# Patient Record
Sex: Female | Born: 1960 | Race: Black or African American | Hispanic: No | Marital: Single | Smoking: Former smoker
Health system: Southern US, Community
[De-identification: ages and names within clinical notes are randomized; demographics above are authoritative.]

## PROBLEM LIST (undated history)

## (undated) DIAGNOSIS — D219 Benign neoplasm of connective and other soft tissue, unspecified: Secondary | ICD-10-CM

## (undated) DIAGNOSIS — M1990 Unspecified osteoarthritis, unspecified site: Secondary | ICD-10-CM

## (undated) DIAGNOSIS — M25559 Pain in unspecified hip: Secondary | ICD-10-CM

## (undated) DIAGNOSIS — I10 Essential (primary) hypertension: Secondary | ICD-10-CM

## (undated) DIAGNOSIS — K635 Polyp of colon: Secondary | ICD-10-CM

## (undated) DIAGNOSIS — D649 Anemia, unspecified: Secondary | ICD-10-CM

## (undated) DIAGNOSIS — N946 Dysmenorrhea, unspecified: Secondary | ICD-10-CM

## (undated) DIAGNOSIS — R079 Chest pain, unspecified: Secondary | ICD-10-CM

## (undated) DIAGNOSIS — F17200 Nicotine dependence, unspecified, uncomplicated: Secondary | ICD-10-CM

## (undated) DIAGNOSIS — I499 Cardiac arrhythmia, unspecified: Secondary | ICD-10-CM

## (undated) HISTORY — DX: Essential (primary) hypertension: I10

## (undated) HISTORY — PX: UMBILICAL HERNIA REPAIR: SHX196

## (undated) HISTORY — DX: Dysmenorrhea, unspecified: N94.6

## (undated) HISTORY — DX: Benign neoplasm of connective and other soft tissue, unspecified: D21.9

## (undated) HISTORY — DX: Polyp of colon: K63.5

## (undated) HISTORY — DX: Nicotine dependence, unspecified, uncomplicated: F17.200

## (undated) HISTORY — DX: Anemia, unspecified: D64.9

## (undated) HISTORY — DX: Pain in unspecified hip: M25.559

---

## 1991-01-23 HISTORY — PX: GYNECOLOGIC CRYOSURGERY: SHX857

## 1998-01-30 ENCOUNTER — Encounter: Payer: Self-pay | Admitting: Emergency Medicine

## 1998-01-30 ENCOUNTER — Emergency Department (HOSPITAL_COMMUNITY): Admission: EM | Admit: 1998-01-30 | Discharge: 1998-01-30 | Payer: Self-pay | Admitting: Emergency Medicine

## 1998-09-27 ENCOUNTER — Emergency Department (HOSPITAL_COMMUNITY): Admission: EM | Admit: 1998-09-27 | Discharge: 1998-09-27 | Payer: Self-pay | Admitting: *Deleted

## 1999-02-22 ENCOUNTER — Other Ambulatory Visit: Admission: RE | Admit: 1999-02-22 | Discharge: 1999-02-22 | Payer: Self-pay | Admitting: Gynecology

## 2000-03-22 ENCOUNTER — Encounter: Payer: Self-pay | Admitting: Gynecology

## 2000-03-22 ENCOUNTER — Ambulatory Visit (HOSPITAL_COMMUNITY): Admission: RE | Admit: 2000-03-22 | Discharge: 2000-03-22 | Payer: Self-pay | Admitting: Gynecology

## 2000-03-26 ENCOUNTER — Encounter: Payer: Self-pay | Admitting: Gynecology

## 2000-03-26 ENCOUNTER — Encounter: Admission: RE | Admit: 2000-03-26 | Discharge: 2000-03-26 | Payer: Self-pay | Admitting: Gynecology

## 2000-05-09 ENCOUNTER — Other Ambulatory Visit: Admission: RE | Admit: 2000-05-09 | Discharge: 2000-05-09 | Payer: Self-pay | Admitting: Gynecology

## 2000-05-29 ENCOUNTER — Other Ambulatory Visit: Admission: RE | Admit: 2000-05-29 | Discharge: 2000-05-29 | Payer: Self-pay | Admitting: Gynecology

## 2001-01-22 HISTORY — PX: CARDIAC CATHETERIZATION: SHX172

## 2001-03-31 ENCOUNTER — Encounter: Payer: Self-pay | Admitting: Gynecology

## 2001-03-31 ENCOUNTER — Ambulatory Visit (HOSPITAL_COMMUNITY): Admission: RE | Admit: 2001-03-31 | Discharge: 2001-03-31 | Payer: Self-pay | Admitting: Gynecology

## 2001-05-12 ENCOUNTER — Other Ambulatory Visit: Admission: RE | Admit: 2001-05-12 | Discharge: 2001-05-12 | Payer: Self-pay | Admitting: Gynecology

## 2001-05-15 ENCOUNTER — Encounter: Admission: RE | Admit: 2001-05-15 | Discharge: 2001-05-15 | Payer: Self-pay | Admitting: Gynecology

## 2001-05-15 ENCOUNTER — Encounter: Payer: Self-pay | Admitting: Gynecology

## 2001-05-19 ENCOUNTER — Encounter: Payer: Self-pay | Admitting: Gynecology

## 2001-05-19 ENCOUNTER — Ambulatory Visit (HOSPITAL_COMMUNITY): Admission: RE | Admit: 2001-05-19 | Discharge: 2001-05-19 | Payer: Self-pay | Admitting: Gynecology

## 2001-05-20 ENCOUNTER — Other Ambulatory Visit: Admission: RE | Admit: 2001-05-20 | Discharge: 2001-05-20 | Payer: Self-pay | Admitting: Gynecology

## 2001-06-17 ENCOUNTER — Ambulatory Visit: Admission: RE | Admit: 2001-06-17 | Discharge: 2001-06-17 | Payer: Self-pay | Admitting: Gynecologic Oncology

## 2001-06-22 HISTORY — PX: ABDOMINAL HYSTERECTOMY: SHX81

## 2001-07-02 ENCOUNTER — Inpatient Hospital Stay (HOSPITAL_COMMUNITY): Admission: RE | Admit: 2001-07-02 | Discharge: 2001-07-05 | Payer: Self-pay | Admitting: Gynecology

## 2001-07-02 ENCOUNTER — Encounter (INDEPENDENT_AMBULATORY_CARE_PROVIDER_SITE_OTHER): Payer: Self-pay | Admitting: Specialist

## 2002-04-27 ENCOUNTER — Emergency Department (HOSPITAL_COMMUNITY): Admission: EM | Admit: 2002-04-27 | Discharge: 2002-04-27 | Payer: Self-pay | Admitting: Emergency Medicine

## 2002-07-07 ENCOUNTER — Encounter: Admission: RE | Admit: 2002-07-07 | Discharge: 2002-07-07 | Payer: Self-pay | Admitting: Gynecology

## 2002-07-07 ENCOUNTER — Encounter: Payer: Self-pay | Admitting: Gynecology

## 2002-08-27 ENCOUNTER — Other Ambulatory Visit: Admission: RE | Admit: 2002-08-27 | Discharge: 2002-08-27 | Payer: Self-pay | Admitting: Gynecology

## 2003-06-07 HISTORY — PX: OTHER SURGICAL HISTORY: SHX169

## 2003-07-27 ENCOUNTER — Emergency Department (HOSPITAL_COMMUNITY): Admission: EM | Admit: 2003-07-27 | Discharge: 2003-07-27 | Payer: Self-pay | Admitting: Emergency Medicine

## 2003-09-03 ENCOUNTER — Other Ambulatory Visit: Admission: RE | Admit: 2003-09-03 | Discharge: 2003-09-03 | Payer: Self-pay | Admitting: Gynecology

## 2004-06-21 ENCOUNTER — Emergency Department (HOSPITAL_COMMUNITY): Admission: EM | Admit: 2004-06-21 | Discharge: 2004-06-21 | Payer: Self-pay | Admitting: Emergency Medicine

## 2004-07-03 HISTORY — PX: TRANSTHORACIC ECHOCARDIOGRAM: SHX275

## 2004-09-29 ENCOUNTER — Other Ambulatory Visit: Admission: RE | Admit: 2004-09-29 | Discharge: 2004-09-29 | Payer: Self-pay | Admitting: Gynecology

## 2004-12-21 ENCOUNTER — Encounter: Admission: RE | Admit: 2004-12-21 | Discharge: 2004-12-21 | Payer: Self-pay | Admitting: Gynecology

## 2005-02-16 ENCOUNTER — Encounter: Admission: RE | Admit: 2005-02-16 | Discharge: 2005-02-16 | Payer: Self-pay | Admitting: Gynecology

## 2005-04-01 ENCOUNTER — Emergency Department (HOSPITAL_COMMUNITY): Admission: EM | Admit: 2005-04-01 | Discharge: 2005-04-01 | Payer: Self-pay | Admitting: Emergency Medicine

## 2005-06-12 ENCOUNTER — Emergency Department (HOSPITAL_COMMUNITY): Admission: EM | Admit: 2005-06-12 | Discharge: 2005-06-13 | Payer: Self-pay | Admitting: Emergency Medicine

## 2005-06-13 ENCOUNTER — Ambulatory Visit (HOSPITAL_COMMUNITY): Admission: RE | Admit: 2005-06-13 | Discharge: 2005-06-13 | Payer: Self-pay | Admitting: Emergency Medicine

## 2005-06-13 ENCOUNTER — Encounter (INDEPENDENT_AMBULATORY_CARE_PROVIDER_SITE_OTHER): Payer: Self-pay | Admitting: *Deleted

## 2005-06-23 ENCOUNTER — Emergency Department (HOSPITAL_COMMUNITY): Admission: EM | Admit: 2005-06-23 | Discharge: 2005-06-24 | Payer: Self-pay | Admitting: Emergency Medicine

## 2005-07-11 ENCOUNTER — Ambulatory Visit (HOSPITAL_COMMUNITY): Admission: RE | Admit: 2005-07-11 | Discharge: 2005-07-11 | Payer: Self-pay | Admitting: Gastroenterology

## 2005-08-02 ENCOUNTER — Emergency Department (HOSPITAL_COMMUNITY): Admission: EM | Admit: 2005-08-02 | Discharge: 2005-08-03 | Payer: Self-pay | Admitting: *Deleted

## 2005-09-24 ENCOUNTER — Emergency Department (HOSPITAL_COMMUNITY): Admission: EM | Admit: 2005-09-24 | Discharge: 2005-09-24 | Payer: Self-pay | Admitting: Emergency Medicine

## 2005-10-10 ENCOUNTER — Encounter: Admission: RE | Admit: 2005-10-10 | Discharge: 2005-10-10 | Payer: Self-pay | Admitting: Gynecology

## 2005-10-18 ENCOUNTER — Other Ambulatory Visit: Admission: RE | Admit: 2005-10-18 | Discharge: 2005-10-18 | Payer: Self-pay | Admitting: Gynecology

## 2005-11-26 ENCOUNTER — Emergency Department (HOSPITAL_COMMUNITY): Admission: EM | Admit: 2005-11-26 | Discharge: 2005-11-26 | Payer: Self-pay | Admitting: Family Medicine

## 2005-12-24 ENCOUNTER — Encounter: Admission: RE | Admit: 2005-12-24 | Discharge: 2005-12-24 | Payer: Self-pay | Admitting: Gynecology

## 2006-01-08 ENCOUNTER — Encounter: Admission: RE | Admit: 2006-01-08 | Discharge: 2006-01-08 | Payer: Self-pay | Admitting: Gynecology

## 2006-09-03 ENCOUNTER — Emergency Department (HOSPITAL_COMMUNITY): Admission: EM | Admit: 2006-09-03 | Discharge: 2006-09-03 | Payer: Self-pay | Admitting: Emergency Medicine

## 2007-01-24 ENCOUNTER — Encounter: Admission: RE | Admit: 2007-01-24 | Discharge: 2007-01-24 | Payer: Self-pay | Admitting: Gynecology

## 2007-01-31 ENCOUNTER — Encounter: Admission: RE | Admit: 2007-01-31 | Discharge: 2007-01-31 | Payer: Self-pay | Admitting: Gynecology

## 2007-03-19 ENCOUNTER — Emergency Department (HOSPITAL_COMMUNITY): Admission: EM | Admit: 2007-03-19 | Discharge: 2007-03-19 | Payer: Self-pay | Admitting: Family Medicine

## 2007-03-27 ENCOUNTER — Emergency Department (HOSPITAL_COMMUNITY): Admission: EM | Admit: 2007-03-27 | Discharge: 2007-03-27 | Payer: Self-pay | Admitting: Emergency Medicine

## 2007-03-31 ENCOUNTER — Ambulatory Visit: Payer: Self-pay | Admitting: Internal Medicine

## 2007-03-31 ENCOUNTER — Encounter: Admission: RE | Admit: 2007-03-31 | Discharge: 2007-03-31 | Payer: Self-pay | Admitting: Internal Medicine

## 2007-03-31 ENCOUNTER — Telehealth: Payer: Self-pay | Admitting: Internal Medicine

## 2007-03-31 DIAGNOSIS — K219 Gastro-esophageal reflux disease without esophagitis: Secondary | ICD-10-CM | POA: Insufficient documentation

## 2007-04-07 ENCOUNTER — Emergency Department (HOSPITAL_COMMUNITY): Admission: EM | Admit: 2007-04-07 | Discharge: 2007-04-07 | Payer: Self-pay | Admitting: Emergency Medicine

## 2007-05-10 ENCOUNTER — Emergency Department (HOSPITAL_COMMUNITY): Admission: EM | Admit: 2007-05-10 | Discharge: 2007-05-10 | Payer: Self-pay | Admitting: Emergency Medicine

## 2007-06-12 ENCOUNTER — Encounter: Admission: RE | Admit: 2007-06-12 | Discharge: 2007-06-12 | Payer: Self-pay | Admitting: Internal Medicine

## 2007-06-22 ENCOUNTER — Emergency Department (HOSPITAL_COMMUNITY): Admission: EM | Admit: 2007-06-22 | Discharge: 2007-06-22 | Payer: Self-pay | Admitting: Family Medicine

## 2007-07-04 ENCOUNTER — Emergency Department (HOSPITAL_COMMUNITY): Admission: EM | Admit: 2007-07-04 | Discharge: 2007-07-04 | Payer: Self-pay | Admitting: Emergency Medicine

## 2007-08-17 ENCOUNTER — Emergency Department (HOSPITAL_COMMUNITY): Admission: EM | Admit: 2007-08-17 | Discharge: 2007-08-18 | Payer: Self-pay | Admitting: Emergency Medicine

## 2007-10-08 ENCOUNTER — Ambulatory Visit: Payer: Self-pay | Admitting: Obstetrics & Gynecology

## 2007-10-18 ENCOUNTER — Emergency Department (HOSPITAL_COMMUNITY): Admission: EM | Admit: 2007-10-18 | Discharge: 2007-10-18 | Payer: Self-pay | Admitting: *Deleted

## 2007-11-13 ENCOUNTER — Encounter: Payer: Self-pay | Admitting: Family Medicine

## 2007-11-13 ENCOUNTER — Ambulatory Visit: Payer: Self-pay | Admitting: Family Medicine

## 2007-11-13 DIAGNOSIS — F320 Major depressive disorder, single episode, mild: Secondary | ICD-10-CM | POA: Insufficient documentation

## 2007-11-13 LAB — CONVERTED CEMR LAB

## 2007-11-20 LAB — CONVERTED CEMR LAB
ALT: 16 units/L (ref 0–35)
AST: 15 units/L (ref 0–37)
Alkaline Phosphatase: 67 units/L (ref 39–117)
Creatinine, Ser: 0.75 mg/dL (ref 0.40–1.20)
TSH: 2.426 microintl units/mL (ref 0.350–4.50)
Total Bilirubin: 0.6 mg/dL (ref 0.3–1.2)
Total CHOL/HDL Ratio: 2.7
VLDL: 12 mg/dL (ref 0–40)

## 2007-12-16 ENCOUNTER — Ambulatory Visit: Payer: Self-pay | Admitting: Family Medicine

## 2007-12-16 ENCOUNTER — Encounter: Payer: Self-pay | Admitting: Family Medicine

## 2007-12-16 LAB — CONVERTED CEMR LAB: Pap Smear: 0

## 2008-03-03 ENCOUNTER — Encounter (INDEPENDENT_AMBULATORY_CARE_PROVIDER_SITE_OTHER): Payer: Self-pay | Admitting: Family Medicine

## 2008-03-03 ENCOUNTER — Ambulatory Visit: Payer: Self-pay | Admitting: Family Medicine

## 2008-03-03 ENCOUNTER — Telehealth: Payer: Self-pay | Admitting: Family Medicine

## 2008-03-24 ENCOUNTER — Ambulatory Visit: Payer: Self-pay | Admitting: Family Medicine

## 2008-03-24 DIAGNOSIS — E785 Hyperlipidemia, unspecified: Secondary | ICD-10-CM | POA: Insufficient documentation

## 2008-04-01 ENCOUNTER — Ambulatory Visit (HOSPITAL_COMMUNITY): Admission: RE | Admit: 2008-04-01 | Discharge: 2008-04-01 | Payer: Self-pay | Admitting: Family Medicine

## 2008-04-15 ENCOUNTER — Encounter: Admission: RE | Admit: 2008-04-15 | Discharge: 2008-04-15 | Payer: Self-pay | Admitting: Family Medicine

## 2008-04-15 ENCOUNTER — Encounter: Payer: Self-pay | Admitting: Family Medicine

## 2008-06-11 ENCOUNTER — Encounter: Payer: Self-pay | Admitting: *Deleted

## 2008-06-11 ENCOUNTER — Ambulatory Visit: Payer: Self-pay | Admitting: Family Medicine

## 2008-06-18 ENCOUNTER — Ambulatory Visit: Payer: Self-pay | Admitting: Family Medicine

## 2008-07-16 ENCOUNTER — Encounter: Payer: Self-pay | Admitting: Family Medicine

## 2008-07-16 ENCOUNTER — Ambulatory Visit: Payer: Self-pay | Admitting: Family Medicine

## 2008-07-20 ENCOUNTER — Telehealth: Payer: Self-pay | Admitting: *Deleted

## 2008-07-22 ENCOUNTER — Encounter: Payer: Self-pay | Admitting: Family Medicine

## 2008-07-22 LAB — CONVERTED CEMR LAB
CO2: 31 meq/L
GFR calc Af Amer: 99 mL/min
Glucose, Bld: 86 mg/dL
Potassium: 3.9 meq/L
Sodium: 140 meq/L
T3, Free: 4.7 pg/mL

## 2008-07-23 ENCOUNTER — Encounter: Admission: RE | Admit: 2008-07-23 | Discharge: 2008-07-23 | Payer: Self-pay | Admitting: Family Medicine

## 2008-07-23 ENCOUNTER — Telehealth: Payer: Self-pay | Admitting: Family Medicine

## 2008-07-28 HISTORY — PX: OTHER SURGICAL HISTORY: SHX169

## 2008-07-30 ENCOUNTER — Encounter: Payer: Self-pay | Admitting: Family Medicine

## 2008-08-02 ENCOUNTER — Telehealth (INDEPENDENT_AMBULATORY_CARE_PROVIDER_SITE_OTHER): Payer: Self-pay | Admitting: *Deleted

## 2008-08-30 ENCOUNTER — Encounter: Payer: Self-pay | Admitting: Family Medicine

## 2008-08-30 ENCOUNTER — Ambulatory Visit: Payer: Self-pay | Admitting: Family Medicine

## 2008-08-31 LAB — CONVERTED CEMR LAB
Free T4: 0.93 ng/dL (ref 0.80–1.80)
T3, Free: 3.3 pg/mL (ref 2.3–4.2)
TSH: 2.066 microintl units/mL (ref 0.350–4.500)

## 2008-09-02 ENCOUNTER — Ambulatory Visit: Payer: Self-pay | Admitting: Family Medicine

## 2008-09-02 ENCOUNTER — Encounter: Payer: Self-pay | Admitting: Family Medicine

## 2008-09-02 DIAGNOSIS — J309 Allergic rhinitis, unspecified: Secondary | ICD-10-CM | POA: Insufficient documentation

## 2008-09-15 LAB — CONVERTED CEMR LAB
ALT: 18 units/L (ref 0–35)
AST: 17 units/L (ref 0–37)
Alkaline Phosphatase: 72 units/L (ref 39–117)
Calcium: 9.5 mg/dL (ref 8.4–10.5)
Chloride: 108 meq/L (ref 96–112)
Creatinine, Ser: 0.83 mg/dL (ref 0.40–1.20)
Potassium: 4.1 meq/L (ref 3.5–5.3)

## 2008-11-15 ENCOUNTER — Telehealth: Payer: Self-pay | Admitting: *Deleted

## 2008-11-19 ENCOUNTER — Ambulatory Visit: Payer: Self-pay | Admitting: Family Medicine

## 2008-11-19 ENCOUNTER — Encounter: Payer: Self-pay | Admitting: Family Medicine

## 2008-11-24 ENCOUNTER — Telehealth: Payer: Self-pay | Admitting: Family Medicine

## 2008-11-25 ENCOUNTER — Ambulatory Visit: Payer: Self-pay | Admitting: Family Medicine

## 2008-12-21 ENCOUNTER — Encounter: Payer: Self-pay | Admitting: Family Medicine

## 2009-01-27 ENCOUNTER — Telehealth: Payer: Self-pay | Admitting: Family Medicine

## 2009-02-28 ENCOUNTER — Ambulatory Visit: Payer: Self-pay | Admitting: Family Medicine

## 2009-02-28 DIAGNOSIS — E663 Overweight: Secondary | ICD-10-CM | POA: Insufficient documentation

## 2009-03-01 ENCOUNTER — Encounter: Payer: Self-pay | Admitting: Family Medicine

## 2009-03-01 ENCOUNTER — Ambulatory Visit: Payer: Self-pay | Admitting: Family Medicine

## 2009-03-02 LAB — CONVERTED CEMR LAB
Direct LDL: 85 mg/dL
Hemoglobin: 12.6 g/dL (ref 12.0–15.0)
MCHC: 32.6 g/dL (ref 30.0–36.0)
MCV: 87.2 fL (ref 78.0–100.0)
RBC: 4.44 M/uL (ref 3.87–5.11)
RDW: 12.9 % (ref 11.5–15.5)

## 2009-03-07 ENCOUNTER — Telehealth: Payer: Self-pay | Admitting: Family Medicine

## 2009-03-08 ENCOUNTER — Telehealth: Payer: Self-pay | Admitting: Family Medicine

## 2009-03-28 ENCOUNTER — Telehealth: Payer: Self-pay | Admitting: Family Medicine

## 2009-03-28 ENCOUNTER — Ambulatory Visit: Payer: Self-pay | Admitting: Family Medicine

## 2009-04-05 ENCOUNTER — Encounter: Admission: RE | Admit: 2009-04-05 | Discharge: 2009-04-05 | Payer: Self-pay | Admitting: Family Medicine

## 2009-04-19 ENCOUNTER — Encounter: Admission: RE | Admit: 2009-04-19 | Discharge: 2009-04-19 | Payer: Self-pay | Admitting: Family Medicine

## 2009-06-30 ENCOUNTER — Encounter: Payer: Self-pay | Admitting: Family Medicine

## 2009-08-01 ENCOUNTER — Encounter: Payer: Self-pay | Admitting: Family Medicine

## 2009-11-29 ENCOUNTER — Ambulatory Visit: Payer: Self-pay | Admitting: Family Medicine

## 2009-11-29 ENCOUNTER — Encounter: Payer: Self-pay | Admitting: Family Medicine

## 2009-11-29 ENCOUNTER — Telehealth (INDEPENDENT_AMBULATORY_CARE_PROVIDER_SITE_OTHER): Payer: Self-pay | Admitting: *Deleted

## 2009-11-29 DIAGNOSIS — G4700 Insomnia, unspecified: Secondary | ICD-10-CM | POA: Insufficient documentation

## 2009-11-30 LAB — CONVERTED CEMR LAB
BUN: 15 mg/dL (ref 6–23)
CO2: 27 meq/L (ref 19–32)
Chloride: 104 meq/L (ref 96–112)
Creatinine, Ser: 0.74 mg/dL (ref 0.40–1.20)
Glucose, Bld: 89 mg/dL (ref 70–99)
LDL Cholesterol: 86 mg/dL (ref 0–99)
Potassium: 3.9 meq/L (ref 3.5–5.3)
TSH: 4.277 microintl units/mL (ref 0.350–4.500)
VLDL: 10 mg/dL (ref 0–40)

## 2009-12-01 ENCOUNTER — Ambulatory Visit: Payer: Self-pay | Admitting: Women's Health

## 2009-12-01 ENCOUNTER — Other Ambulatory Visit: Admission: RE | Admit: 2009-12-01 | Discharge: 2009-12-01 | Payer: Self-pay | Admitting: Gynecology

## 2010-02-12 ENCOUNTER — Encounter: Payer: Self-pay | Admitting: Gynecology

## 2010-02-16 ENCOUNTER — Ambulatory Visit
Admission: RE | Admit: 2010-02-16 | Discharge: 2010-02-16 | Payer: Self-pay | Source: Home / Self Care | Attending: Women's Health | Admitting: Women's Health

## 2010-02-21 NOTE — Assessment & Plan Note (Signed)
Summary: unable to sleep for a wek or more , has to work and feels bad/ls   Vital Signs:  Patient profile:   50 year old female Height:      66 inches Weight:      164.50 pounds BMI:     26.65 Temp:     98.3 degrees F oral BP sitting:   144 / 86  (left arm) Cuff size:   regular  Vitals Entered By: Jimmy Footman, CMA (November 29, 2009 3:31 PM) CC: BP med refill, Sleeping issues x1 month Is Patient Diabetic? No   Primary Care Provider:  Myrtie Soman, MD  CC:  BP med refill and Sleeping issues x1 month.  History of Present Illness: Many stressors:  had a bad break-up in Aug no physical or mental abuse, was with partner for 3 years, they have no children together  Trying to sell her home because of finanaces  Started her job back in 2010 but fianances not as stable , works at [employer] first shift, had a pay cut, Full Time-- 6:45 to 3:15,   After work feels very fatigued at work and after , will occ doze off but typically watch TV  Mood: Feels like she is very overwhelmed, Having hot flashes worse at night feels like they are do to stress, Occ feels a tightness in chest sees Cardiology , feels this when stressed, other times not and sometimes when drinking caffiene, has had some crying episodes since breakup, no SI, her church and prayer gets her through these times. States she lost 13pounds this since last visit in march, feels like it was due to her appetite up and down. History of mild depressive episode in past, did not take meds      Insomnia- had 5 hours of sleep total last week  Typically watches TV to help wind down, turns eerything off by 10pm,get up at 5 am to get ready Drinks a cup of coffee before work but no caffiene at night   HTN- see above regarding symptoms, needs meds refilled, follow with Dr. Allyson Sabal Cardiology, needs labwork-overdue, reviewed his last note  Had Hysterctomy , ovaries in tact  Habits & Providers  Alcohol-Tobacco-Diet     Tobacco Status:  current  Current Medications (verified): 1)  Pravachol 40 Mg Tabs (Pravastatin Sodium) .... One By Mouth Daily For Cholesterol 2)  Hydrochlorothiazide 12.5 Mg Caps (Hydrochlorothiazide) .... One By Mouth Daily For Blood Pressure 3)  Cardizem Cd 120 Mg Xr24h-Cap (Diltiazem Hcl Coated Beads) .... One By Mouth Daily For Heart Rate and Blood Pressure 4)  Fluticasone Propionate 50 Mcg/act Susp (Fluticasone Propionate) .... One Spray in Each Nostril Daily 5)  Zyrtec Allergy 10 Mg Tabs (Cetirizine Hcl) .... Take One Daily 6)  Trazodone Hcl 50 Mg Tabs (Trazodone Hcl) .Marland Kitchen.. 1 By Mouth At Bedtime 7)  Multivitamins  Tabs (Multiple Vitamin) .Marland Kitchen.. 1 By Mouth Daily  Allergies (verified): No Known Drug Allergies  Past History:  Past Surgical History: Last updated: 11/19/2008 gravida one, para one, abortus zero heart catheterization (2005); normal per pt report.  hysterectomy  ~ 2002.   ABIs normal 7/10 (ordered by Dr. Allyson Sabal [facility]) event monitor 7/10 -- PACs and sinus tachycardia  Past Medical History: GERD Hypertension  5-year history Depression L Breast cyst s/p aspiration 3/10 - benign findings.  h/o palpitations on cardizem, BB caused fatigue Ganglion cyst- wrist h/o Vit D defciency  Family History: Reviewed history from 11/13/2007 and no changes required. father died of  complications of stroke at age 66.  mother died at 69 of an MI HTN in mom  6 brothers 8 sisters positive for HTN, "heart problems" no cancers no diabetes no bleeding or clotting disorders  Physical Exam  General:  NAD, Vital signs noted, well groomed noted 13pound weight loss since March 2011 Neck:  no thryomegaly or nodules felt Lungs:  CTAB Heart:  Normal rate and regular rhythm. S1 and S2 normal without gallop, murmur, click, rub or other extra sounds. Pulses:  2+radial Psych:  Oriented X3, memory intact for recent and remote, normally interactive, good eye contact, not anxious appearing, and not depressed  appearing.  no apparent hallucinations, very pleasant   Impression & Recommendations:  Problem # 1:  MOOD DISORDER (ICD-296.90) Assessment New  Pt with history of depression per notes, changes in mood and sleep for past 2 -3 months. Has criteria of depression and adjustement disorder with her recent break-up. She has found ways to cope with her support group and no red flags regarding self harm, however if her sleep can be improved this wil make a big difference. I plan to give her the number for an outpatient therapist she would like to talk to someone about her stress and have an outlet. At this time will start trazdone for sleep, has some anti-depressant activity as well but will need higher doses to treat depression and may not be best option  RTC 2 week Noted weight loss, Mammogram wnl, s/p hysterectomy, colonoscopy due in 1 year. Will follow, this may all be secondary to her mood   [facility]  [phone]  Orders: [facility]- Est  Level 4 [phone])  Problem # 2:  INSOMNIA (ICD-780.52) Assessment: New  See above trial of trazodone RTC 2 weeks, would avoid ambien at this time and pt will need everynight may be best medications initially  Problem # 3:  HYPERTENSION (ICD-401.9) Assessment: Deteriorated  Refilled medications, pt out of HCTZ, check labs, previously well controlled  Her updated medication list for this problem includes:    Hydrochlorothiazide 12.5 Mg Caps (Hydrochlorothiazide) ..... One by mouth daily for blood pressure    Cardizem Cd 120 Mg Xr24h-cap (Diltiazem hcl coated beads) ..... One by mouth daily for heart rate and blood pressure  Orders: Basic Met-[facility] (02725-36644) TSH-[facility] (03474-25956)  Problem # 4:  HYPERLIPIDEMIA (ICD-272.4) Assessment: Unchanged Check lipids, no change to pravchol at this visit Her updated medication list for this problem includes:    Pravachol 40 Mg Tabs (Pravastatin sodium) ..... One by mouth daily for  cholesterol  Orders: Lipid-[facility] (38756-43329) [facility]- Est  Level 4 ([address]214)  Complete Medication List: 1)  Pravachol 40 Mg Tabs (Pravastatin sodium) .... One by mouth daily for cholesterol 2)  Hydrochlorothiazide 12.5 Mg Caps (Hydrochlorothiazide) .... One by mouth daily for blood pressure 3)  Cardizem Cd 120 Mg Xr24h-cap (Diltiazem hcl coated beads) .... One by mouth daily for heart rate and blood pressure 4)  Fluticasone Propionate 50 Mcg/act Susp (Fluticasone propionate) .... One spray in each nostril daily 5)  Zyrtec Allergy 10 Mg Tabs (Cetirizine hcl) .... Take one daily 6)  Trazodone Hcl 50 Mg Tabs (Trazodone hcl) .Marland Kitchen.. 1 by mouth at bedtime 7)  Multivitamins Tabs (Multiple vitamin) .Marland Kitchen.. 1 by mouth daily  Patient Instructions: 1)  Return for a visit in 2 weeks to f/u the new medication 2)  We will discuss labs at that time Prescriptions: MULTIVITAMINS  TABS (MULTIPLE VITAMIN) 1 by mouth daily  #30 x 12  Entered and Authorized by:   Milinda Antis MD   Signed by:   Milinda Antis MD on 11/29/2009   Method used:   [facility] to        [facility]  [address] [phone]* (retail)       [address]       [place], [place]  [address]       Ph: [phone]       Fax: [phone]   RxID:   [id number] CARDIZEM CD 120 MG XR24H-CAP (DILTIAZEM HCL COATED BEADS) one by mouth daily for heart rate and blood pressure  #30 x 3   Entered and Authorized by:   Milinda Antis MD   Signed by:   Milinda Antis MD on 11/29/2009   Method used:   [facility]        [facility]  [address] [phone][facility] (retail)       [address]       [place], [place]  [address]       Ph: [phone]       Fax: [phone]   RxID:   [id number] HYDROCHLOROTHIAZIDE 12.5 MG CAPS (HYDROCHLOROTHIAZIDE) one by mouth daily for blood pressure  #90 x 3   Entered and Authorized by:   Milinda Antis MD   Signed by:   Milinda Antis MD on 11/29/2009   Method used:    [facility]        [facility] [phone][facility] (retail)       [address]       [place], [place]  [address]       Ph: [phone]       Fax: [phone]   RxID:   [id number] PRAVACHOL 40 MG TABS (PRAVASTATIN SODIUM) one by mouth daily for cholesterol  #30 x 3   Entered and Authorized by:   Milinda Antis MD   Signed by:   Milinda Antis MD on 11/29/2009   Method used:   [facility]        [facility]  [address] [id number] ([facility]ail)       [address]       [place], [place]  [address]       Ph: [phone]       Fax: [phone]   RxID:   [id number] TRAZODONE HCL 50 MG TABS (TRAZODONE HCL) 1 by mouth at bedtime  #30 x 1   Entered and Authorized by:   Milinda Antis MD   Signed by:   Milinda Antis MD on 11/29/2009   Method used:   [facility]        [facility]  [address] [id number] ([facility]ail)       [address]       [place], [place]  [address]       Ph: [phone]       Fax: [phone]   RxID:   [id number]    Orders Added: 1)  Basic Met-[facility] [58099-83382] 2)  Lipid-[facility] [80061-22930] 3)  TSH-[facility] [50539-76734] 4)  [facility]- Est  Level 4 [19379]     Prevention & Chronic Care Immunizations   Influenza vaccine: Not documented    Tetanus booster: 03/24/2008: Tdap   Tetanus booster due: 03/25/2018    Pneumococcal vaccine: Not  documented  Other Screening   Pap smear: .  (12/16/2007)   Pap smear due: Not Indicated    Mammogram: BI-RADS CATEGORY 2:  Benign finding(s).^MM DIGITAL DIAG LTD R  (04/19/2009)   Mammogram due: 04/20/2010   Smoking status: current  (11/29/2009)   Smoking cessation counseling: yes  (03/24/2008)  Lipids   Total Cholesterol: 202  (11/13/2007)   Lipid panel action/deferral: LDL Direct Ordered   LDL: 161  (11/13/2007)   LDL Direct: 85  (03/01/2009)   HDL: 75  (11/13/2007)   Triglycerides: 58  (11/13/2007)    SGOT (AST): 17  (09/02/2008)   SGPT (ALT): 18   (09/02/2008)   Alkaline phosphatase: 72  (09/02/2008)   Total bilirubin: 0.6  (09/02/2008)    Lipid flowsheet reviewed?: Yes   Progress toward LDL goal: At goal  Hypertension   Last Blood Pressure: 144 / 86  (11/29/2009)   Serum creatinine: 0.83  (09/02/2008)   Serum potassium 4.1  (09/02/2008)    Hypertension flowsheet reviewed?: Yes   Progress toward BP goal: Deteriorated  Self-Management Support :   Personal Goals (by the next clinic visit) :      Personal blood pressure goal: 140/90  (11/19/2008)     Personal LDL goal: 100  (11/19/2008)    Hypertension self-management support: Written self-care plan  (11/25/2008)    Lipid self-management support: Written self-care plan  (11/25/2008)

## 2010-02-21 NOTE — Progress Notes (Signed)
Summary: results  Phone Note Call from Patient Call back at Work Phone [phone] Call back at [phone]   Caller: Patient Summary of Call: wants to know results of blood test Initial call taken by: De Nurse,  March 08, 2009 10:09 AM  Follow-up for Phone Call        will forward to MD. Follow-up by: Theresia Lo RN,  March 08, 2009 10:12 AM  Additional Follow-up for Phone Call Additional follow up Details #1::        called pt. discussed labs. Pt expresses agreement and understanding  Additional Follow-up by: Myrtie Soman  MD,  March 09, 2009 10:09 AM

## 2010-02-21 NOTE — Progress Notes (Signed)
Summary: phn msg  Phone Note Call from Patient Call back at [phone]   Caller: Patient Summary of Call: having been feeling sluggish and tired and wants to know if she can recommend something for it. Initial call taken by: De Nurse,  January 27, 2009 11:17 AM  Follow-up for Phone Call        wants RX for Aleve, an OTC vitamin. states her flexible spending account will pay for it if a doctor writes it as a rx. suggested coming to see md to explore other reasons for her fatigue. states she is willing to do this. told her I will call her with md response & we will set appt then Follow-up by: Golden Circle RN,  January 27, 2009 11:26 AM  Additional Follow-up for Phone Call Additional follow up Details #1::        I agree, Kennon Rounds. Should have an appointment. please call pt to advise. thanks.  Additional Follow-up by: Myrtie Soman  MD,  January 27, 2009 11:32 AM    Additional Follow-up for Phone Call Additional follow up Details #2::    appt made for next Tues with pcp Follow-up by: Golden Circle RN,  January 27, 2009 2:09 PM

## 2010-02-21 NOTE — Assessment & Plan Note (Signed)
Summary: f/up,tcb   Vital Signs:  Patient profile:   50 year old female Height:      66 inches Weight:      173.1 pounds BMI:     28.04 Temp:     97.7 degrees F oral Pulse rate:   67 / minute BP sitting:   132 / 89  (left arm) Cuff size:   regular  Vitals Entered By: Garen Grams LPN (February 28, 2009 4:20 PM) CC: no energy/fatigue Is Patient Diabetic? No Pain Assessment Patient in pain? no        Primary Care Provider:  Myrtie Soman, MD  CC:  no energy/fatigue.  History of Present Illness: 1. fatigue Has been going on for months. Sleeps well at night. Stays tired during the day. Works first shift - has to get there at 6:45 am. Feels okay for the first part of the day. Starts to feel sluggish around 10 am until she gets off work. Tries to lay down after getting off work but this doesn't usually make her feel better.  Thinks that her fatigue might have gotten worse after starting the metoprolol.   2. weight gain joined a gym last Sunday. Is really sore from the workouts but trying to stick with it. Doesn't really watch her diet. Eats a lot of potatoes. Eats a lot when stressed. Thinks stress is coming from her job and her BF. Stopped smoking in January for a few weeks -- thinks that a lot of the weight gain occurred then.   3. HTN taking HCTZ 12.5 mg daily without problems. Also on metoprolol 12.5 xl daily.   4. tobacco.  down to 1/4 PPD. Interested in smoking cessation classes. would like to quit. Quit briefly in Jan as above.   5. ganglion cyst Has been there for month. Unattractive to the patient but also uncomfortable. Would like it resolved. Not painful.   ROS: no syncope, presyncope or dizziness. No chest pain or SOB.  Habits & Providers  Alcohol-Tobacco-Diet     Tobacco Status: current     Cigarette Packs/Day: <0.25  Current Medications (verified): 1)  Simvastatin 40 Mg Tabs (Simvastatin) .... One By Mouth Daily For Cholesterol 2)  Hydrochlorothiazide  12.5 Mg Caps (Hydrochlorothiazide) .... One By Mouth Daily For Blood Pressure 3)  Metoprolol Succinate 25 Mg Xr24h-Tab (Metoprolol Succinate) .... 1/2 Tablet Daily For Heart Rate and Blood Pressure  Allergies (verified): No Known Drug Allergies  Social History: Single, recently out of a relationship (Sept 09) 30 yo son smoker x 30 years (1 pack per week).   Working full-time at [employer] (5/10)Smoking Status:  current  Physical Exam  Additional Exam:  General:  Vital signs reviewed -- overweight Alert, appropriate; well-dressed and well-nourished Lungs:  work of breathing unlabored, clear to auscultation bilaterally; no wheezes, rales, or ronchi; good air movement throughout Heart:  regular rate and rhythm, no murmurs; normal s1/s2 MSK: 1 x 1 cm superficial ganglion cyst on R wrist Pulses:  DP and radial pulses 2+ bilaterally  Extremities:  no cyanosis, clubbing, or edema Neurologic:  alert and oriented. speech normal.  Patient given informed consent for injection. Discussed possible complications of infection, bleeding. Appropriate verbal time out taken. Area cleaned and prepped in usual sterile fashion. 3-cc 1% lidocaine without epinephrine was injected into the R wrist. Then aspiration attempted of ganglion cyst -- attempt unsuccessful. Patient tolerated procedure well with no complications.     Impression & Recommendations:  Problem # 1:  FATIGUE (  ICD-780.79) Assessment Deteriorated check labs below. Possibly related to beta blocker. Will do a trial of cardizem to see if this helps. follow-up in 2-3 weeks. Orders: [facility]- Est  Level 4 (99214)Future Orders: Vit D, 25 OH-[facility] ([id number]) ... 02/24/2010 TSH-[facility] [id number]) ... 02/24/2010 CBC-[facility] (88416) ... 02/24/2010  Problem # 2:  OVERWEIGHT (ICD-278.02) Assessment: Unchanged  BMI 28. Congratulated on joininig the gym. Counseled on diet and exercise. Consider nutrition referral if no success in losing  weight.  Orders: [facility]- Est  Level 4 (60630)  Problem # 3:  HYPERTENSION (ICD-401.9) Assessment: Improved  at goal . follow-up BP on cardizem. Her updated medication list for this problem includes:    Hydrochlorothiazide 12.5 Mg Caps (Hydrochlorothiazide) ..... One by mouth daily for blood pressure    Cardizem Cd 120 Mg Xr24h-cap (Diltiazem hcl coated beads) ..... One by mouth daily for heart rate and blood pressure  Orders: [facility]- Est  Level 4 (99214)  Problem # 4:  GANGLION CYST, WRIST, RIGHT (ICD-727.41) Assessment: New drainage attempted. Unsuccessful. Will refer to hand surgeon to have removed.  Orders: Surgical Referral (Surgery) [facility]- Est  Level 4 (16010) Injection, intermediate joint - [facility] (93235)  Problem # 5:  SMOKER (ICD-305.1) Assessment: Unchanged encouraged to quit. Gave brochure on our smoking cessation classes here.  Orders: [facility]- Est  Level 4 (57322)  Complete Medication List: 1)  Simvastatin 40 Mg Tabs (Simvastatin) .... One by mouth daily for cholesterol 2)  Hydrochlorothiazide 12.5 Mg Caps (Hydrochlorothiazide) .... One by mouth daily for blood pressure 3)  Cardizem Cd 120 Mg Xr24h-cap (Diltiazem hcl coated beads) .... One by mouth daily for heart rate and blood pressure  Other Orders: Future Orders: Direct LDL-[facility] ([id number]) ... 02/24/2010  Patient Instructions: 1)  stop the metoprolol. Start cardizem once a day. 2)  we'll let you know about the lab work. 3)  Good for you for joining the gym...keep it up, this may actually do a lot for your fatigue as well. 4)  follow-up with me in 2-3 weeks. Prescriptions: CARDIZEM CD 120 MG XR24H-CAP (DILTIAZEM HCL COATED BEADS) one by mouth daily for heart rate and blood pressure  #30 x 1   Entered and Authorized by:   Myrtie Soman  MD   Signed by:   Myrtie Soman  MD on 02/28/2009   Method used:   [facility]  [address] [phone][facility] (retail)       [address]       [place], [place]  [address]       Ph: [phone]       Fax: [phone]   RxID:   [id number]    Prevention & Chronic Care Immunizations   Influenza vaccine: Not documented    Tetanus booster: 03/24/2008: Tdap   Tetanus booster due: 03/25/2018    Pneumococcal vaccine: Not documented  Other Screening   Pap smear: .  (12/16/2007)   Pap smear due: Not Indicated    Mammogram: BI-RADS CATEGORY 2:  Benign finding(s).^MM DIGITAL DIAGNOSTIC UNILAT R  (07/23/2008)   Mammogram due: 04/15/2009   Smoking status: current  (02/28/2009)   Smoking cessation counseling: yes  (03/24/2008)  Lipids   Total Cholesterol: 202  (11/13/2007)   Lipid panel action/deferral: LDL Direct Ordered   LDL: 696  (11/13/2007)   LDL Direct: 80  (11/19/2008)   HDL: 75  (11/13/2007)   Triglycerides: 58  (11/13/2007)  SGOT (AST): 17  (09/02/2008)   SGPT (ALT): 18  (09/02/2008)   Alkaline phosphatase: 72  (09/02/2008)   Total bilirubin: 0.6  (09/02/2008)  Hypertension   Last Blood Pressure: 132 / 89  (02/28/2009)   Serum creatinine: 0.83  (09/02/2008)   Serum potassium 4.1  (09/02/2008)  Self-Management Support :   Personal Goals (by the next clinic visit) :      Personal blood pressure goal: 140/90  (11/19/2008)     Personal LDL goal: 100  (11/19/2008)    Hypertension self-management support: Written self-care plan  (11/25/2008)    Lipid self-management support: Written self-care plan  (11/25/2008)     Appended Document: f/up,tcb Prescriptions: CARDIZEM CD 120 MG XR24H-CAP (DILTIAZEM HCL COATED BEADS) one by mouth daily for heart rate and blood pressure  #30 x 2   Entered by:   Jone Baseman CMA   Authorized by:   Myrtie Soman  MD   Signed by:   Jone Baseman CMA on 03/01/2009   Method used:   [facility] [address] # [phone]* (retail)       [address] [place], [place]  [address]       Ph: [phone]       Fax: [phone]   RxID:    [id number] SIMVASTATIN 40 MG TABS (SIMVASTATIN) one by mouth daily for cholesterol  #30 x 2   Entered by:   Jone Baseman CMA   Authorized by:   Myrtie Soman  MD   Signed by:   Jone Baseman CMA on 03/01/2009   Method used:   [facility]        [facility] # [phone]* (retail)       [address] [place], [place]  [address]       Ph: [phone]       Fax: [phone]   RxID:   [id number]

## 2010-02-21 NOTE — Progress Notes (Signed)
  Phone Note Outgoing Call   Call placed by: Myrtie Soman  MD,  March 07, 2009 12:10 PM    New/Updated Medications: PRAVACHOL 40 MG TABS (PRAVASTATIN SODIUM) one by mouth daily for cholesterol Prescriptions: PRAVACHOL 40 MG TABS (PRAVASTATIN SODIUM) one by mouth daily for cholesterol  #30 x 2   Entered and Authorized by:   Myrtie Soman  MD   Signed by:   Myrtie Soman  MD on 03/07/2009   Method used:   [facility] to        [facility] # [phone]* (retail)       [address] [place], [place]  [address]       Ph: [phone]       Fax: [phone]   RxID:   [id number]

## 2010-02-21 NOTE — Assessment & Plan Note (Signed)
Summary: chart update   

## 2010-02-21 NOTE — Assessment & Plan Note (Signed)
Summary: sinus problem/Gabriela [name]/Gabriela [name]   Vital Signs:  Patient profile:   50 year old female Height:      66 inches Weight:      177 pounds BMI:     28.67 Temp:     97.8 degrees F oral Pulse rate:   61 / minute Pulse (ortho):   69 / minute BP sitting:   130 / 82  (right arm) BP standing:   125 / 88 Cuff size:   regular  Vitals Entered By: Jone Baseman CMA (March 28, 2009 3:03 PM)  Serial Vital Signs/Assessments:  Time      Position  BP       Pulse  Resp  Temp     By 3:30 PM   Lying LA  134/84   60                    Jessica Fleeger CMA 3:30 PM   Sitting   130/85   69                    Jessica Fleeger CMA 3:30 PM   Standing  125/88   69                    Jessica Fleeger CMA  CC: sinus probs x 1 week Is Patient Diabetic? No Pain Assessment Patient in pain? no        Primary Care Provider:  Myrtie Soman, MD  CC:  sinus probs x 1 week.  History of Present Illness: Pt presents with 1-2 weeks of lightheadedness with standing, Right ear ache/fullness, post-nasal drip, and occasional HA in center of head.  denies fevers, purulent nasal drainage.  has taken a nasal steroid in past but not now.  takes hydroxyzine for hives.  This med makes her feel "out of it"  she would like a diff allergy med.  Habits & Providers  Alcohol-Tobacco-Diet     Tobacco Status: current  Current Medications (verified): 1)  Pravachol 40 Mg Tabs (Pravastatin Sodium) .... One By Mouth Daily For Cholesterol 2)  Hydrochlorothiazide 12.5 Mg Caps (Hydrochlorothiazide) .... One By Mouth Daily For Blood Pressure 3)  Cardizem Cd 120 Mg Xr24h-Cap (Diltiazem Hcl Coated Beads) .... One By Mouth Daily For Heart Rate and Blood Pressure 4)  Fluticasone Propionate 50 Mcg/act Susp (Fluticasone Propionate) .... One Spray in Each Nostril Daily 5)  Zyrtec Allergy 10 Mg Tabs (Cetirizine Hcl) .... Take One Daily  Allergies (verified): No Known Drug Allergies  Review of Systems       The patient complains of  headaches.  The patient denies anorexia, fever, decreased hearing, chest pain, and syncope.    Physical Exam  General:  Well-developed,well-nourished,in no acute distress; alert,appropriate and cooperative throughout examination Head:  Normocephalic and atraumatic without obvious abnormalities. No apparent alopecia or balding. Eyes:  No corneal or conjunctival inflammation noted. EOMI. Perrla Vision grossly normal. Ears:  External ear exam shows no significant lesions or deformities.  Otoscopic examination reveals clear canals, tympanic membranes are intact bilaterally without bulging, retraction, inflammation or discharge. Hearing is grossly normal bilaterally. Nose:  External nasal examination shows no deformity or inflammation. Nasal mucosa are pink and moist without lesions or exudates. Mouth:  Oral mucosa and oropharynx without lesions or exudates.  Teeth in good repair. Lungs:  Normal respiratory effort, chest expands symmetrically. Lungs are clear to auscultation, no crackles or wheezes. Heart:  Normal rate and regular rhythm. S1 and S2 normal  without gallop, murmur, click, rub or other extra sounds.   Impression & Recommendations:  Problem # 1:  ALLERGIC RHINITIS CAUSE UNSPECIFIED (ICD-477.9) Assessment Deteriorated think that her symptoms may be due to seasonal allergies. no noticible changes on exam, but hx suggests this.  will try to increase her medication regimen and see  if this helps.  chose zyrtec as relative of hydroxyzine for skin urticaria and allergies.  put her back on nasal steroid.  described proper use. Her updated medication list for this problem includes:    Fluticasone Propionate 50 Mcg/act Susp (Fluticasone propionate) ..... One spray in each nostril daily    Zyrtec Allergy 10 Mg Tabs (Cetirizine hcl) .Marland Kitchen... Take one daily  Orders: [facility]- Est Level  3 (99213)  Complete Medication List: 1)  Pravachol 40 Mg Tabs (Pravastatin sodium) .... One by mouth daily for  cholesterol 2)  Hydrochlorothiazide 12.5 Mg Caps (Hydrochlorothiazide) .... One by mouth daily for blood pressure 3)  Cardizem Cd 120 Mg Xr24h-cap (Diltiazem hcl coated beads) .... One by mouth daily for heart rate and blood pressure 4)  Fluticasone Propionate 50 Mcg/act Susp (Fluticasone propionate) .... One spray in each nostril daily 5)  Zyrtec Allergy 10 Mg Tabs (Cetirizine hcl) .... Take one daily  Other Orders: EMR Misc Charge Code [facility])  Patient Instructions: 1)  It was nice to meet you today. 2)  I'm sorry that you have been feeling poorly over the last few weeks.  I think that this may be due to the start of allergy season.  I am going to refill your nasal steroid and then I would like you to start taking Zyrtec.  This medicine is over the counter and it will help with your allergies as well as with your itch/hives when you have it. 3)  If you have fevers or if your lightheadedness gets worse, please come back to be reevaluated. Prescriptions: ZYRTEC ALLERGY 10 MG TABS (CETIRIZINE HCL) take one daily  #30 x 3   Entered and Authorized by:   Ellery Plunk MD   Signed by:   Ellery Plunk MD on 03/28/2009   Method used:   Historical   RxID:   [id number] FLUTICASONE PROPIONATE 50 MCG/ACT SUSP (FLUTICASONE PROPIONATE) one spray in each nostril daily  #1 x 3   Entered and Authorized by:   Ellery Plunk MD   Signed by:   Ellery Plunk MD on 03/28/2009   Method used:   Electronically to        [facility]  [address] [phone]* (retail)       [address]       [place], [place]  [address]       Ph: [phone]       Fax: [phone]   RxID:   [id number]

## 2010-02-21 NOTE — Progress Notes (Signed)
  Phone Note Call from Patient   Caller: Patient Call For: [phone] Summary of Call: Not been sleeping for a week.  Need to see someone this afternoon.  Pt not feeling well. Initial call taken by: Abundio Miu,  November 29, 2009 11:13 AM  Follow-up for Phone Call        spoke with patient . states she has not been able to sleep for more than a week . last week she maybe got 5 hours of sleep. she just feels bad due to inability to sleep.  has hot flashes that adds to the problm. tried  to have her come in tomorrow but she wants to be seen todaybecause she needs some sleep.  states inability to sleep is tearing her body down. appointment scheduled  today. Follow-up by: Theresia Lo RN,  November 29, 2009 11:30 AM

## 2010-02-21 NOTE — Consult Note (Signed)
Summary: [facility]  [facility]   Imported By: Knox Royalty 08/13/2009 12:47:54  _____________________________________________________________________  External Attachment:    Type:   Image     Comment:   External Document

## 2010-02-21 NOTE — Progress Notes (Signed)
Summary: triage  Phone Note Call from Patient Call back at Work Phone [phone]   Caller: Patient Summary of Call: been dizzy for about a week and wants to come in today Initial call taken by: De Nurse,  March 28, 2009 1:54 PM  Follow-up for Phone Call        states she has been lightheaded x 1 wk. worse after bending down & then standing back up. taking her meds daily. has pain in middle of head & nose. has not taken anything. took off work early so she could be seen. told her to come now but there will be a wait. she was ok with this Follow-up by: Golden Circle RN,  March 28, 2009 2:02 PM

## 2010-03-06 ENCOUNTER — Other Ambulatory Visit: Payer: Self-pay | Admitting: Family Medicine

## 2010-03-06 DIAGNOSIS — G4700 Insomnia, unspecified: Secondary | ICD-10-CM

## 2010-03-06 MED ORDER — TRAZODONE HCL 50 MG PO TABS: 50.0000 mg | ORAL_TABLET | Freq: Every day | ORAL | Status: DC

## 2010-04-28 ENCOUNTER — Other Ambulatory Visit: Payer: Self-pay | Admitting: Gynecology

## 2010-04-28 DIAGNOSIS — Z1231 Encounter for screening mammogram for malignant neoplasm of breast: Secondary | ICD-10-CM

## 2010-05-01 ENCOUNTER — Ambulatory Visit: Payer: Self-pay

## 2010-05-08 ENCOUNTER — Ambulatory Visit
Admission: RE | Admit: 2010-05-08 | Discharge: 2010-05-08 | Disposition: A | Discharge status: Home / Self Care | Payer: BC Managed Care – PPO | Source: Ambulatory Visit | Attending: Gynecology | Admitting: Gynecology

## 2010-05-08 DIAGNOSIS — Z1231 Encounter for screening mammogram for malignant neoplasm of breast: Secondary | ICD-10-CM

## 2010-05-21 ENCOUNTER — Other Ambulatory Visit: Payer: Self-pay | Admitting: Family Medicine

## 2010-05-21 DIAGNOSIS — I10 Essential (primary) hypertension: Secondary | ICD-10-CM

## 2010-06-06 NOTE — Group Therapy Note (Signed)
NAMEKELLEE, SITTNER                ACCOUNT NO.:  [id number]   MEDICAL RECORD NO.:  [id number]          PATIENT TYPE:  WOC   LOCATION:  [facility]                   FACILITY:  [facility]   PHYSICIAN:  Myrtie Soman, MD     DATE OF BIRTH:  July 12, 1960   DATE OF SERVICE:                                  CLINIC NOTE   ADDENDUM:   ASSESSMENT AND PLAN:  STD testing.  We will test the patient for GC and  Chlamydia via urine samples, which she does not have a cervix.  We will  check a serum HIV and RPR test, as well.  Wet Pepcid has been performed,  and we will treat the patient accordingly as to what this shows.           ______________________________  Myrtie Soman, MD     TE/MEDQ  D:  10/08/2007  T:  10/09/2007  Job:  [id number]

## 2010-06-06 NOTE — Group Therapy Note (Signed)
NAMEGARYN, [name]                ACCOUNT NO.:  [id number]   MEDICAL RECORD NO.:  [id number]          PATIENT TYPE:  WOC   LOCATION:  [facility]                   FACILITY:  [facility]   PHYSICIAN:  Allie Bossier, MD        DATE OF BIRTH:  09-Nov-1960   DATE OF SERVICE:  10/08/2007                                  CLINIC NOTE   HISTORY OF PRESENT ILLNESS:  Patient presents for routine annual exam.  She is 50 years old.  She has no specific complaints at this time.  She  states that she has had a history of a hysterectomy and has been getting  annual Pap's.  She is uncertain as to whether or not she has a cervix.  Patient takes hydrochlorothiazide for high blood pressure and  hydroxyzine p.r.n. for occasional breakouts of hives.  She is uncertain  as to what causes these breakouts.  She has no other concerns at this  time.   ALLERGIES:  No known drug allergies.   MEDICATIONS:  1. Hydrochlorothiazide 12.5 mg.  2. Hydroxyzine p.r.n.   PAST MEDICAL HISTORY:  1. Significant for hypertension.  2. She has been receiving annual Pap smears.  Patient had a mammogram      in May, 2009 for a cyst that she noted in her left breast.  Results      of this ultrasound were unconcerning for metastatic disease or      neoplastic process, showing simply a simple left breast cyst.  She      is due for a screening mammogram in eight months.  3. Patient had a colonoscopy two years ago that she states was normal.  4. Patient had a heart catheterization in 2006 to evaluate chest pain      that she says was normal.   PAST OB HISTORY:  Patient is a G3, P1, 0-2-1, whose last pregnancy was  13 years ago.  She has one living son.   PAST GYN HISTORY:  Hysterectomy in 2003 for fibroid uterus.  She is  uncertain as to whether or not she has a cervix.   FAMILY HISTORY:  Patient is significant for myocardial infarction at age  38 in her mother, from which she died.  Also significant for high blood  pressure in both  her mother and sister.  There is no family history of  diabetes.   SOCIAL HISTORY:  Patient lives alone.  She is a smoker of 20 years.  She  denies drug use or alcohol intake.   REVIEW OF SYSTEMS:  Significant for muscle aches that occur occasionally  in her left leg.  She states that she used to get these coincident with  her menstrual cycles prior to her hysterectomy and over the recent  months, they have returned and are of mild inconvenience to her.  The  patient also reports a vaginal odor over the past week that she  describes as fishy.  She also notes some mild itching in the area.  She  denies any discharge.  Patient would also like to be screened for  sexually transmitted diseases  today and states that she is sexually  active.   PHYSICAL EXAMINATION:  Blood pressure 118/74, pulse 69, temperature  98.2.  GENERAL:  This is a well-appearing female in no acute distress.  She is  appropriate with exam.  HEENT:  Significant for normal extraocular muscles.  Sclerae are  anicteric.  CARDIOVASCULAR:  Regular rate and rhythm.  No murmurs, rubs or gallops  noted.  LUNGS:  Work of breathing is unlabored.  She is clear to auscultation  bilaterally.  There are no wheezes, rales or rhonchi noted.  ABDOMEN:  Positive bowel sounds.  Soft, nontender.  No masses palpated.  BREASTS:  Right breast is unremarkable with normal distribution of  fibrofatty tissue.  There are no supraclavicular or axillary nodes  palpated.  Left breast reveals a 1 x 1 cm cystic-like mass that has been  previously verified on ultrasound, that is nontender to palpation.  It  is about 1 cm above the left nipple in the center of the breast.  Again,  no supraclavicular or axillary nodes are palpated on exam.  GYN:  Normal external female genital anatomy.  Vaginal mucosa is  somewhat atrophic.  It is pink.  There is a physiologic discharge, a  mild fishy odor associated.  Speculum examination does not reveal a  cervix:   Bimanual exam does not reveal a uterus or cervix.  There is no  adnexal tenderness or mass noted.  EXTREMITIES:  Warm and well perfused.  Dorsalis pedis and radial pulses  2+.  PSYCH:  Patient is normally interactive and alert.   ASSESSMENT/PLAN:  Patient is a 50 year old female who presents for  annual physical exam.  1. Breast examination is performed.  2. Patient was instructed that as she does not have a cervix, she does      not need to receive annual Pap smears henceforth.  3. Hypertension:  Patient's hydrochlorothiazide prescription was      refilled.  Patient was advised to establish primary care in the      community and in her future.  4. Follow-up mammogram:  Patient is scheduled to have a mammogram in      eight months for routine screening of both breasts.  The breast      center is to call her with this appointment, she reports.     ______________________________  Myrtie Soman, MD    ______________________________  Allie Bossier, MD    TE/MEDQ  D:  10/08/2007  T:  10/09/2007  Job:  [id number]

## 2010-06-09 NOTE — H&P (Signed)
[facility]  Patient:    Gabriela [name], [place] Visit Number: [id number] MRN: [id number]          Service Type: GYN Location: 1S X002 01 Attending Physician:  Tonye Royalty Dictated by:   Gaetano Hawthorne. Lily Peer, M.D. Admit Date:  07/02/2001                           History and Physical  The patient is scheduled for July 02, 2001, at 8:30 a.m.  Please have History & Physical available.  CHIEF COMPLAINT: 1. Pelvic mass. 2. Anemia.  HISTORY OF PRESENT ILLNESS:  The patient is a 50 year old gravida 0 who was seen in the office on April 29 where she underwent an endometrial biopsy as part of the workup for an enlarging pelvic mass.  She had been complaining for several months of increasing cramping, bloating, and constipation.  On pelvic exam at the time of her annual examination on April 21, her uterus was approximately 16-week size.  We discussed at that time that, due to her being only 50 years of age, we would do a total hysterectomy with ovarian conservation.  Her CBC at that visit demonstrated a hemoglobin of 8.9, hematocrit 28.6, and platelet count 149,000.  She was started on supplementation.  Workup demonstrated iron level of 349 which was elevated. The total iron binding capacity was normal.  Percent saturation was 77%, vitamin B12 was 561, and folate was normal at 9.1.  Her ferritin level was low at 16.  This can be explained and ongoing for quite some time due to her pelvic mass.  As part of her evaluation, she underwent endometrial biopsy on April 29 which demonstrated blood with mixed secretory endometrium, no hyperplasia or malignancy identified.  It appears that her uterus had increased in size gradually over the years since the year 2000 where it was 10 to 12-week size and was now 16-week size.  Additional workup preoperatively was as follows.  She had a chest x-ray on April 28 which was normal.  Recent mammogram was also  normal.  Abdominal CT scan was done.  There was a question on the chest CT of possible scarring in one of her lung fields, but this was discarded after followup chest x-ray demonstrated it to be normal.  There was a simple cyst reported in the medial aspect of the right hepatic lobe and also an approximately 9 to 10 mm cyst in the upper aspect at the right hepatic lobe, slightly prominent size.  Retroperitoneal nodes were also noted.  There was a node described as being just to the left of the abdominal aorta which measured 10 x 11 mm diameter considered upper limits of normal, by size criteria was slightly pathologically enlarged according to the radiologist.  There were a few small bilateral periaortic retroperitoneal nodes noted as well.  The pelvic CT demonstrated a fibroid uterus measuring at least 14 cm in size. They describe an exophytic lobulated structure adjacent to the left aspect of the enlarged uterus which could represent the left ovary.  They described also this exophytic uterine tumor as possibly a sarcoma but was less likely.  Mild hydronephrosis was noted bilaterally.  Tumor markers consisting of CA125, HCG and alpha-fetoprotein were all normal.  The patient was given Lupron 11.5 mg IM in effort to help shrink the fibroid and improve also on her anemia since she was taking iron supplementation now at 1 tablet twice a day.  She was also to see the GYN oncologist, Dr. Ronita Hipps, who will be the cosurgeon in the exploratory case and saw her in consultation on May 27 in the event that his expertise may be needed in the vent this pelvic mass could turn out to be a sarcoma.  PAST MEDICAL HISTORY:  She has suffered from dysmenorrhea and fibroids for many years.  She had cryotherapy of her cervix secondary to dysplasia in 1993. She used to smoke one pack of cigarettes per week and discontinued one week ago.  She suffers from iron-deficiency anemia.  She had  umbilical herniorrhaphy as a child.  FAMILY HISTORY:  Mother had hypertension and cardiovascular disease.  Her father also had stroke and is deceased.  ALLERGIES:  The patient denies any allergies.  PHYSICAL EXAMINATION:  VITAL SIGNS:  The patient is 5 feet 6-1/2 inches tall, 188 pounds.  Blood pressure 120/90 in the office on April 21.  HEENT:  Unremarkable.  NECK:  Supple.  Trachea midline.  No carotids, no thyromegaly.  LUNGS:  Clear to auscultation without rhonchi or wheeze.  HEART:  Regular rate and rhythm.  No murmurs or gallops.  BREASTS:  No palpable mass or tenderness.  LYMPHADENOPATHY:  No supraclavicular or axillary lymphadenopathy.  ABDOMEN:  Soft, nontender, without rebound or guarding.  PELVIC:  Bartholin, urethral, and Skene within normal limits.  Vagina and cervix reveal no coarse lesions.  On inspection of uterus, approximately 16-week size, making it difficult to assess her adnexa.  RECTAL:  Examination unremarkable, hemoccult negative.  ASSESSMENT:  A 50 year old gravida 0 with increasing pelvic mass.  In the past several years, it has grown from 10-week size to 16-week size.  The patient had normal endometrial biopsy, normal tumor markers consisting of CA125, HCG, and alpha-fetoprotein.  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  If benign entity is identified, all efforts will be made for ovarian conservation.  Otherwise, a full staging procedure may result in a TAH-BSO, periaortic and pelvic lymphadenectomy, along with partial omentectomy.  The risks of the procedure were discussed to include infection, bleeding, trauma to internal organs.  She will received prophylaxis antibiotics.  Also to prevent deep venous thrombosis and pulmonary embolism, pneumatic compressive stockings will be in placed.  In the event of hemorrhage and patient needing blood and blood products, the patient is fully aware of potential risks  such as anaphylactic reaction, hepatitis, and AIDs.  There is also the potential risk of intraoperative trauma to include trauma to the  bladder, bowel, nerves, or other  pelvic structures with corrective surgery needing to be done.  The patient will undergo Golytely bowel prep the day before surgery in the event there is any intestinal involvement which may require segmental resection of the bowel or diverting colostomy.  All of these issues were discussed with the patient who is fully aware, accepts, and we will plan for her surgery of exploratory laparotomy and total abdominal hysterectomy with possible bilateral salpingo-oophorectomy and partial omentectomy with partial periaortic and pelvic lymphadenectomy tomorrow, July 02, 2001, at [facility].  PLAN:  Per Assessment above. Dictated by:   Gaetano Hawthorne Lily Peer, M.D. Attending Physician:  Tonye Royalty DD:  07/01/01 TD:  07/01/01 Job: [id number]

## 2010-06-09 NOTE — Op Note (Signed)
[facility]  Patient:    Gabriela [name], Gabriela [name] Visit Number: [id number] MRN: [id number]          Service Type: GYN Location: 4W [id number] [id number] Attending Physician:  Tonye Royalty Dictated by:   Gaetano Hawthorne. Lily Peer, M.D. Proc. Date: 07/02/01 Admit Date:  07/02/2001 Discharge Date: 07/05/2001                             Operative Report  PREOPERATIVE DIAGNOSES: 1. Pelvic mass. 2. Leiomyomatous uteri. 3. Iron-deficiency anemia.  POSTOPERATIVE DIAGNOSES: 1. Pelvic mass. 2. Leiomyomatous uteri. 3. Iron-deficiency anemia.  PROCEDURE PERFORMED: 1. Exploratory laparotomy. 2. Pelvic adhesiolysis. 3. Total abdominal hysterectomy. 4. Excision of right pararenal cyst. 5. Excision of left paratubal cyst.  SURGEON:  Juan H. Lily Peer, M.D.  COSURGEON:  John T. Kyla Balzarine, M.D.  ANESTHESIA:  General endotracheal.  INDICATIONS FOR PROCEDURE:  The patient is a 50 year old gravida 0 with symptomatic leiomyomatous uteri and suspicious pelvic mass.  Due to increasing uterine size in a short interval of time, and she has had prominent periaortic lymph node, high suspicion for uterine sarcoma with the preoperative entertaining diagnosis.  FINDINGS:  Leiomyomatous uteri of approximately 16 weeks size, a left paraovarian cyst, right paratubal cyst, and pelvic adhesions.  DESCRIPTION OF PROCEDURE:  After the patient was adequately counseled, she was taken to the operating room where she underwent general endotracheal anesthesia.  Pneumatic compression stockings were in place.  She had received 2 g of Cefotan preoperatively.  After general endotracheal anesthesia was obtained, the abdomen was prepped and draped in the usual sterile fashion.  A Foley catheter was inserted in an effort to monitor urinary output.  A Pfannenstiel skin incision was made after examination under anesthesia demonstrated there was good mobility of the uterus and based on its size that it was  safe to proceed with a Pfannenstiel skin incision.  The incision was carried down through the skin and subcutaneous tissue down to the rectus fascia whereby a midline nick was made.  The fascia was incised in a transverse fashion and the midline raphe was entered.  The peritoneal cavity was entered cautiously.  The Bookwalter retractors were utilized for exposure during the operation.  Pelvic adhesiolysis was required in an effort to free some of the pelvic adhesions that were present, especially in the right and left pelvic side wall.  Otherwise, no gross excrescence are noted on the uterine surface.  A right paraovarian cyst was noted which was excised with the Bovie and passed off for histological evaluation and the left paratubal cyst was noted which was excised and also removed for histological evaluation. Heaney clamps were placed on both triple pedicles on both sides of the uterus for traction.  The right round ligament was identified and it was transected. The anterior broad ligament was incised down to the region of the cervix.  The right ureter was identified and due to the fact that the patient is 50 years of age and ovarian conservation, it was made possible by leaving the ovaries behind.  With the surgeons finger, the posterior broad ligament was penetrated, and the Heaney clamp was placed hugging the uterus, and thus far, transecting the utero-ovarian ligament and the ovary and tube was left behind, and it was secured with 0 Vicryl suture followed by a transfixation suture of 0 Vicryl suture as well.  A similar procedure was carried out on the contralateral side and the parametrial trace  of tissue was serially skeletonized and the remainder of the broad and cardinal ligaments were serially clamped, cut, and suture ligated with 0 Vicryl suture to lower the uterine artery.  Once this was accomplished, the bladder was peeled off the lower uterine segment and the cervix, and the  remaining cervical fascial region was clamped with the Heaney clamps, and the cervix and uterus were passed off the operative field.  Both angles were secured with a transfixation stitch of 0 Vicryl suture and the remaining vaginal cuff was closed with a running stitch of 0 Vicryl suture.  The pelvic cavity was copiously irrigated with normal saline solution.  In an effort to prevent future seal, Ioban technique was utilized to close the cul-de-sac with a running stitch of pursestring of 0 Vicryl suture.  Both ovaries were suspended to the area of the round ligament with 3-0 Vicryl suture.  The pelvic cavity was once again copiously irrigated with normal saline solution.  After obtaining adequate hemostasis, the Bookwalter retractor was removed.  Sponge count and needle count were correct.  The visceroperitoneum was now reapproximated and the fascia was closed with a running stitch of 0 Vicryl suture.  The subcutaneous bleeders were Bovie cauterized and the skin was reapproximated with skin clips followed by placement of Xeroform gauze followed by dressing.  The patient was extubated and transferred to the recovery room with stable vital signs.  Blood loss was 100 cc.  Urine output was 100 cc and IV fluid was 2000 cc of lactated Ringers. Dictated by:   Gaetano Hawthorne Lily Peer, M.D. Attending Physician:  Tonye Royalty DD:  07/02/01 TD:  07/04/01 Job: [id number]

## 2010-06-09 NOTE — Consult Note (Signed)
[facility]  Patient:    Gabriela [name], Gabriela [name] Visit Number: [id number] MRN: [id number]          Service Type: GON Location: GYN Attending Physician:  Sabino Donovan Dictated by:   Jackquline Denmark. Kyla Balzarine, M.D. Admit Date:  06/17/2001   CC:         Juan H. Lily Peer, M.D.  Telford Nab, R.N.   Consultation Report  HISTORY:  This 50 year old woman is referred by Dr. Lily Peer for consultation regarding management of leiomyomata uteri, enlarging and symptomatic.  HISTORY OF PRESENT ILLNESS:  The patient has a history of enlarging fibroid uterus.  Of note, three years ago, it was though to be approximately 10-12 weeks in size, and has serially increased to approximately 16 weeks in size. The patient notes increasingly heavy menses and has been anemic, with hemoglobin 8.9 in April.  She has been on iron replacement, and was recently started on Lupron Depot.  Endometrial biopsy on April 29 was benign.  The patient has undergone abdominopelvic CT scan, revealing simple cysts in the liver and small but slightly prominent retroperitoneal lymph nodes.  The largest of these measures 10 x 11 mm.  On pelvic CT scan, the uterus measured at least 14 weeks in size with an exophytic, lobulated structure adjacent to the left.  This could represent left ovary or pedunculated fibroid.  Mild hydronephrosis was present bilaterally.  CA-125, HCG and AFP were essentially normal.  She has had an ultrasound.  PAST MEDICAL HISTORY:  Essentially negative for major comorbidities or surgeries.  MEDICATIONS: 1. Iron. 2. Lupron Depot.  ALLERGIES:  None known.  PERSONAL SOCIAL HISTORY:  The patient smokes approximately 1/2 pack per day cigarettes.  She admits to occasional ethanol.  FAMILY MEDICAL HISTORY:  Noncontributory.  REVIEW OF SYSTEMS:  Otherwise negative.  PHYSICAL EXAMINATION:  GENERAL:  Alert and oriented x3 in no acute distress.  VITAL SIGNS:  Weight 188 pounds with  vital signs stable and afebrile.  HEENT:  Oropharynx clear.  NECK:  Supple without goiter.  LYMPH:  No pathologic lymphadenopathy.  LUNGS:  Fields clear to auscultation and percussion.  HEART:  Regular rate and rhythm without JVD.  BREAST:  Deferred.  ABDOMEN:  Soft and benign without ascites, mass or organomegaly.  The uterine fundus is palpable approximately 4 cm above the symphysis.  EXTREMITIES:  Full range of motion and strength without edema.  PELVIC:  External genitalia and BUS normal to inspection and palpation.  The bladder and urethra are well supported.  Vaginal mucosa is clear without lesions.  The cervix is mobile without lesions.  There is minimal tenderness with cervical motion.  Bimanual and rectovaginal examinations reveal a grossly-enlarged, lobulated uterus compatible with leiomyomata.  There is no parametrial enlargement.  Because of the uterine size of approximately 14-16 weeks, I am unable to reliably feel the ovaries.  No gross adnexal masses are palpated.  ASSESSMENT:  Uterine leiomyomata with anemia.  PLAN:  I certainly agree with Dr. Lily Peer assessment that the patient would benefit from hysterectomy for her symptomatic leiomyomata.  We discussed issues regarding management of uterine sarcoma or ovarian malignancy if this were encountered, but I think it would be unlikely.  Because of her age, I would recommend retaining the ovaries unless there are pathologic features. The patient is in agreement with this plan.  We discussed the risks and benefits of surgery.  The surgery is tentatively scheduled in the near future with Dr. Lily Peer. Dictated by:   Jonny Ruiz  Nicki Guadalajara, M.D. Attending Physician:  Ronita Hipps T DD:  06/17/01 TD:  06/19/01 Job: [id number]

## 2010-06-09 NOTE — Discharge Summary (Signed)
[facility]  Patient:    Gabriela [name], Gabriela [name] Visit Number: [id number] MRN: [id number]          Service Type: GYN Location: *N Attending Physician:  Tonye Royalty Dictated by:   Gaetano Hawthorne. Lily Peer, M.D. Proc. Date: 08/05/01 Adm. Date:  07/02/01 Disc. Date: 07/05/01                             Discharge Summary  HISTORY:  The patient is a 50 year old gravida 0 who was admitted to [facility] on the morning of June 11 for a planned exploratory laparotomy, total abdominal hysterectomy for a preoperative diagnosis of a pelvic mass and a leiomyomatous uteri and iron deficiency anemia.  She underwent an exploratory laparotomy, total abdominal hysterectomy, excision of right peritubal cyst, excision of a left peritubal cyst, and pelvic adhesiolysis. Findings in the operation demonstrated she had a 16 week sized leiomyomatous uteri with a left paraovarian cyst, right peritubal cyst, and pelvic adhesions.  She lost 100 cc from her surgery and did well.  Was kept with pneumatic compression stockings for DVT prophylaxis.  Received 2 g of Cefotan prior to her surgery.  Her first postoperative day her hemoglobin and hematocrit were 11.1 and 33.5, respectively with a platelet count 327,000. Tmax was 99.0.  Her Foley catheter was removed after the 24 hours as well as her PCA pump and she was started on liquid diet that evening and began ambulation.  Her second postoperative day her Tmax was 99.7.  Her lungs were clear to auscultation and she had hypoactive bowel sounds.  She had tolerated a liquid diet and was kept on it until the following day where she was started on a regular diet.  She had good bowel sounds on the third postoperative day and also had passed flatus.  She had been given Dulcolax suppositories which had helped increase her intestinal motility.  She was ready to be discharged home on that third day.  Her staples were removed.  Her  incision was Steri-Stripped and she was ready for discharge home.  Her final pathology report demonstrated a right ovarian benign serous cystadenoma and no evidence of borderline changes or carcinoma of the uterus, benign cervix, no dysplasia identified, proliferative endometrium without any hyperplasia or malignancy identified.  She did have adenomyosis and leiomyomatous uteri whereby fibroids were noted at submucosal, intramural, subserosal regions of the uterus and benign uterine serosa.  She also had a left peritubal cyst with no malignancy identified.  FINAL DISPOSITION:            The patient was discharged on her third postoperative day.  She was ambulating, voiding well, tolerating a regular diet.  Discussion of the final pathology report was shared with the patient. Her staples were removed.  The incision was Steri-Stripped.  She was ready to be discharged home.  She was given prescription of Lortab 7.5/500 to take one q.4-6h. p.r.n.  She was given a prescription of Reglan 10 mg p.o. to take t.i.d. as needed.  She was to follow up in the office of [employer] in two to three weeks for final postoperative visit. Dictated by:   Gaetano Hawthorne Lily Peer, M.D. Attending Physician:  Tonye Royalty DD:  08/05/01 TD:  08/08/01 Job: [id number]

## 2010-06-27 ENCOUNTER — Ambulatory Visit: Payer: BC Managed Care – PPO | Admitting: Family Medicine

## 2010-07-07 ENCOUNTER — Encounter: Payer: Self-pay | Admitting: Family Medicine

## 2010-07-07 ENCOUNTER — Ambulatory Visit (INDEPENDENT_AMBULATORY_CARE_PROVIDER_SITE_OTHER): Payer: BC Managed Care – PPO | Admitting: Family Medicine

## 2010-07-07 DIAGNOSIS — F320 Major depressive disorder, single episode, mild: Secondary | ICD-10-CM

## 2010-07-07 DIAGNOSIS — G4700 Insomnia, unspecified: Secondary | ICD-10-CM

## 2010-07-07 MED ORDER — TRAZODONE HCL 50 MG PO TABS: 50.0000 mg | ORAL_TABLET | Freq: Every day | ORAL | Status: DC

## 2010-07-07 MED ORDER — HYDROXYZINE HCL 10 MG PO TABS: 10.0000 mg | ORAL_TABLET | Freq: Three times a day (TID) | ORAL | Status: AC | PRN

## 2010-07-07 NOTE — Progress Notes (Signed)
  Subjective:    Patient ID: Gabriela [name], female    DOB: 03-22-1960, 50 y.o.   MRN: [id number]  HPI Pt is here for evaluation of insomnia and depression. These are chronic problems. No worse than usual.  Pt reports anxiety attack on Tuesday. This was associated with palpitations, numbness in leg and arm and headache. Patient has had this before. She called EMS, was evaluated and reassured.   Pt's biggest concern of lack of sleep. She usually sleeps 2.5 hrs per night for the past week. Pt tries to be in bed by 9 PM has to get up at 5 AM.  Has difficulty getting out of bed in the morning and going to work. She has not missed work. She has multiple stressors in her life with financial (had to move out of her house and put it up for short sell), family (53 yo son moved back home). Appetite is increased some days. She would like to lose weight and maintain a steady weight. She denies suicidal and homicidal ideation. She denies hallucinations. Patients states that she gets along with other people well.    Review of Systems  Constitutional: Positive for appetite change and fatigue. Negative for fever, chills, diaphoresis, activity change and unexpected weight change.  Psychiatric/Behavioral: Positive for confusion, sleep disturbance, dysphoric mood and agitation. Negative for suicidal ideas, hallucinations, behavioral problems, self-injury and decreased concentration. The patient is nervous/anxious. The patient is not hyperactive.        Objective:   Physical Exam  Psychiatric: She has a normal mood and affect. Her speech is normal and behavior is normal. Judgment and thought content normal. Cognition and memory are normal.          Assessment & Plan:

## 2010-07-07 NOTE — Assessment & Plan Note (Signed)
Long standing problem. No acute change.  Pt PHQ-9 with a score of 3. She marked feeling down, depressed or hopeless and feeling tired or having little energy as occuring more than half the days. She marked trouble falling/astying asleep as nearly every day. She indicated that this troubles have not made it difficult to work, take care of things and get along with others.  Recommended pt try trazodone again as I suspect she has not actually tried it. I also recommended improving sleep hygiene and mood with daily physical activity.  Pt to f/u with PCP.

## 2010-07-07 NOTE — Patient Instructions (Signed)
Ms. Gane, Please try the Trazadone again. Give it 2-3 weeks to see if it helps. Also schedule a fun and active activity everyday after work.  I look up medication and supplements to help with concentration. Nothing with good evidence came up. I anticipate that treating your insomnia will help with your concentration.   Insomnia is frequent trouble falling and/or staying asleep. Insomnia can be a long term problem or a short term problem. Both are common. Insomnia can be a short term problem when the wakefulness is related to a certain stress or worry. Long term insomnia is often related to ongoing stress during waking hours and/or poor sleeping habits. Overtime, sleep deprivation itself can make the problem worse. Every little thing feels more severe because you are overtired and your ability to cope is decreased. SYMPTOMS  Not feeling rested in the morning.   Anxiety and restlessness at bedtime.   Difficulty falling and staying asleep.  CAUSES  Stress, anxiety, and depression.   Poor sleeping habits.   Distractions such as TV in the bedroom.   Naps close to bedtime.   Engaging in emotionally charged conversations before bed.   Technical reading before sleep.   Alcohol and other sedatives. They may make the problem worse. They can hurt normal sleep patterns and normal dream activity.   Stimulants such as caffeine for several hours prior to bedtime.   Pain syndromes and shortness of breath can cause insomnia.   Exercise late at night.   Changing time zones may cause sleeping problems (jet lag).  It is sometimes helpful to have someone observe your sleeping patterns. They should look for periods of not breathing during the night (sleep apnea). They should also look to see how long those periods last. If you live alone or observers are uncertain, you can also be observed at a sleep clinic where your sleep patterns will be professionally monitored. Sleep apnea requires a checkup and  treatment. Give your caregivers your medical history. Give your caregivers observations your family has made about your sleep.   TREATMENT  Your caregiver may prescribe treatment for an underlying medical disorders. Your caregiver can give advice or help if you are using alcohol or other drugs for self-medication. Treatment of underlying problems will usually eliminate insomnia problems.   Medications can be prescribed for short time use. They are generally not recommended for lengthy use.   Over-the-counter sleep medicines are not recommended for lengthy use. They can be habit forming.   You can promote easier sleeping by making lifestyle changes such as:   Using relaxation techniques that help with breathing and reduce muscle tension.   Exercising earlier in the day.   Changing your diet and the time of your last meal. No night time snacks.   Establish a regular time to go to bed.   Counseling can help with stressful problems and worry.   Soothing music and white noise may be helpful if there are background noises you cannot remove.   Stop tedious detailed work at least one hour before bedtime.  HOME CARE INSTRUCTIONS  Keep a diary. Inform your caregiver about your progress. This includes any medication side effects. See your caregiver regularly. Take note of:   Times when you are asleep.   Times when you are awake during the night.      The quality of your sleep.   How you feel the next day.      This information will help your caregiver care for you.  Get out of bed if you are still awake after 15 minutes. Read or do some quiet activity. Keep the lights down. Wait until you feel sleepy and go back to bed.   Keep regular sleeping and waking hours. Avoid naps.   Exercise regularly.   Avoid distractions at bedtime. Distractions include watching television or engaging in any intense or detailed activity like attempting to balance the household checkbook.   Develop a  bedtime ritual. Keep a familiar routine of bathing, brushing your teeth, climbing into bed at the same time each night, listening to soothing music. Routines increase the success of falling to sleep faster.   Use relaxation techniques. This can be using breathing and muscle tension release routines. It can also include visualizing peaceful scenes. You can also help control troubling or intruding thoughts by keeping your mind occupied with boring or repetitive thoughts like the old concept of counting sheep. You can make it more creative like imagining planting one beautiful flower after another in your backyard garden.   During your day, work to eliminate stress. When this is not possible use some of the previous suggestions to help reduce the anxiety that accompanies stressful situations.  MAKE SURE YOU:    Understand these instructions.   Will watch your condition.   Will get help right away if you are not doing well or get worse.  Document Released: 01/06/2000 Document Re-Released: 12/22/2007 [facility] Patient Information 2011 [place], [place].

## 2010-07-07 NOTE — Assessment & Plan Note (Addendum)
Long standing problem. No acute change.  Recommended pt try trazodone again as I suspect she has not actually tried it. I also recommended improving sleep hygiene and mood with daily physical activity.  Pt to f/u with PCP.

## 2010-08-24 ENCOUNTER — Other Ambulatory Visit: Payer: Self-pay | Admitting: Family Medicine

## 2010-08-28 NOTE — Telephone Encounter (Signed)
Wrong provider

## 2010-08-30 ENCOUNTER — Other Ambulatory Visit: Payer: Self-pay | Admitting: Family Medicine

## 2010-08-30 MED ORDER — PRAVASTATIN SODIUM 40 MG PO TABS: 40.0000 mg | ORAL_TABLET | Freq: Every day | ORAL | Status: DC

## 2010-10-16 LAB — POCT CARDIAC MARKERS
CKMB, poc: <1 — ABNORMAL LOW
Myoglobin, poc: 16.4
Operator id: 272551

## 2010-10-20 LAB — DIFFERENTIAL
Basophils Absolute: 0
Eosinophils Relative: 2
Lymphocytes Relative: 30
Neutro Abs: 4.3
Neutrophils Relative %: 60

## 2010-10-20 LAB — BASIC METABOLIC PANEL
BUN: 10
Calcium: 9.8
Creatinine, Ser: 1.02
GFR calc non Af Amer: 58 — ABNORMAL LOW
Glucose, Bld: 109 — ABNORMAL HIGH

## 2010-10-20 LAB — CBC
Platelets: 335
RDW: 12.5

## 2010-10-20 LAB — POCT CARDIAC MARKERS
Operator id: 234501
Troponin i, poc: <0.05

## 2010-10-23 LAB — BASIC METABOLIC PANEL
BUN: 12
GFR calc non Af Amer: >60
Glucose, Bld: 101 — ABNORMAL HIGH
Potassium: 3.7

## 2010-10-23 LAB — DIFFERENTIAL
Basophils Absolute: 0.1
Eosinophils Absolute: 0.2
Eosinophils Relative: 3
Lymphocytes Relative: 33

## 2010-10-23 LAB — CBC
HCT: 38.1
MCV: 87.5
Platelets: 304
RDW: 12.1

## 2010-10-23 LAB — D-DIMER, QUANTITATIVE: D-Dimer, Quant: <0.22

## 2010-12-29 ENCOUNTER — Other Ambulatory Visit: Payer: Self-pay | Admitting: Family Medicine

## 2010-12-29 MED ORDER — HYDROCHLOROTHIAZIDE 12.5 MG PO CAPS: 12.5000 mg | ORAL_CAPSULE | Freq: Every day | ORAL | Status: DC

## 2010-12-29 NOTE — Progress Notes (Signed)
Notified via pharmacy that patient needs appointment.

## 2011-02-23 ENCOUNTER — Telehealth: Payer: Self-pay | Admitting: Family Medicine

## 2011-02-23 NOTE — Telephone Encounter (Signed)
Patient is coming in on 2/11 for her physical.  She is off on 2/4 and would like to have her blood work done then.  She is wanting the blood work that she will fast for, but there needs to be an order put in.  Can this please be done before 2/4?

## 2011-02-26 ENCOUNTER — Other Ambulatory Visit: Payer: BC Managed Care – PPO

## 2011-02-26 DIAGNOSIS — I10 Essential (primary) hypertension: Secondary | ICD-10-CM

## 2011-02-26 DIAGNOSIS — E785 Hyperlipidemia, unspecified: Secondary | ICD-10-CM

## 2011-02-26 LAB — LIPID PANEL
Cholesterol: 222 mg/dL — ABNORMAL HIGH (ref 0–200)
HDL: 70 mg/dL (ref >39)
Triglycerides: 44 mg/dL (ref <150)

## 2011-02-26 LAB — BASIC METABOLIC PANEL
Calcium: 9.6 mg/dL (ref 8.4–10.5)
Creat: 0.79 mg/dL (ref 0.50–1.10)

## 2011-02-26 NOTE — Telephone Encounter (Signed)
Ordered through [facility].

## 2011-02-26 NOTE — Progress Notes (Signed)
FLP AND BMP DONE TODAY Candia Kingsbury 

## 2011-03-05 ENCOUNTER — Encounter: Payer: Self-pay | Admitting: Family Medicine

## 2011-03-05 ENCOUNTER — Ambulatory Visit (INDEPENDENT_AMBULATORY_CARE_PROVIDER_SITE_OTHER): Payer: BC Managed Care – PPO | Admitting: Family Medicine

## 2011-03-05 DIAGNOSIS — E785 Hyperlipidemia, unspecified: Secondary | ICD-10-CM

## 2011-03-05 DIAGNOSIS — I10 Essential (primary) hypertension: Secondary | ICD-10-CM

## 2011-03-05 DIAGNOSIS — E663 Overweight: Secondary | ICD-10-CM

## 2011-03-05 DIAGNOSIS — F39 Unspecified mood [affective] disorder: Secondary | ICD-10-CM

## 2011-03-05 DIAGNOSIS — F320 Major depressive disorder, single episode, mild: Secondary | ICD-10-CM

## 2011-03-05 NOTE — Assessment & Plan Note (Signed)
With about 35 pound weight gain over past several months. Discussed options with patient, including going up on statin. Hold off for now. Will try lifestyle modification with goal of 10% (20 pound) weight loss. Patient meeting with Health and Wellness coach Rosalita Chessman today.

## 2011-03-05 NOTE — Assessment & Plan Note (Signed)
See hyperlipidemia A&P

## 2011-03-05 NOTE — Assessment & Plan Note (Signed)
BP fair control on HCTZ. Continue current dose. Discussed normal BMET.  Patient does have some lower extremity edema (mild). Will monitor for now. Likely dependent edema.

## 2011-03-05 NOTE — Patient Instructions (Signed)
If your lab results are normal, I will send you a letter with the results. If abnormal, someone at the clinic will get in touch with you.   Follow-up at your earliest convenience so we can discuss your tiredness and mood.  I hope you follow-up with our Health Coach.  Follow-up in 3 months for your cholesterol.

## 2011-03-05 NOTE — Progress Notes (Signed)
  Subjective:    Patient ID: Gabriela [name], female    DOB: [birth year], 51 y.o.   MRN: [id number]  HPI Patient here for physical. Her initial visit with me.   Complaining of: 1. Weight gain.  She has gained 30+ pounds (her baseline seems to be about 160) over past few months.   2. Depressive symptoms Saw Dr. Armen Pickup in June for poor sleep and concentration Now she is about to lose her job (last day in April but may have to leave sooner).  3. HLD Discussed cholesterol results  Review of Systems Denies chest pain Denies difficulty breathing Feels safe.     Objective:   Physical Exam Gen: NAD Psych: appears mildly depressed?; does not appear anxious; well-dressed; appropriate CV: RRR without m/r/g Pulm: CTAB without w/r/r Abd: NABS, soft, NT Ext: non-pitting bilateral pretibial edema Skin: no worrisome lesions GU: deferred. Scheduled to have Pap with Ob/Gyn next month    Assessment & Plan:

## 2011-03-06 ENCOUNTER — Encounter: Payer: Self-pay | Admitting: Family Medicine

## 2011-03-06 LAB — TSH: TSH: 2.349 u[IU]/mL (ref 0.350–4.500)

## 2011-03-06 LAB — CBC
HCT: 38.5 % (ref 36.0–46.0)
Hemoglobin: 12.3 g/dL (ref 12.0–15.0)
MCH: 27.8 pg (ref 26.0–34.0)
MCHC: 31.9 g/dL (ref 30.0–36.0)

## 2011-03-08 DIAGNOSIS — N946 Dysmenorrhea, unspecified: Secondary | ICD-10-CM | POA: Insufficient documentation

## 2011-03-08 DIAGNOSIS — I10 Essential (primary) hypertension: Secondary | ICD-10-CM | POA: Insufficient documentation

## 2011-03-08 DIAGNOSIS — D649 Anemia, unspecified: Secondary | ICD-10-CM | POA: Insufficient documentation

## 2011-03-08 DIAGNOSIS — D219 Benign neoplasm of connective and other soft tissue, unspecified: Secondary | ICD-10-CM | POA: Insufficient documentation

## 2011-03-08 DIAGNOSIS — Z86010 Personal history of colonic polyps: Secondary | ICD-10-CM | POA: Insufficient documentation

## 2011-03-08 DIAGNOSIS — Z87891 Personal history of nicotine dependence: Secondary | ICD-10-CM | POA: Insufficient documentation

## 2011-03-13 ENCOUNTER — Telehealth: Payer: Self-pay | Admitting: Family Medicine

## 2011-03-13 MED ORDER — HYDROCHLOROTHIAZIDE 12.5 MG PO CAPS: 12.5000 mg | ORAL_CAPSULE | Freq: Every day | ORAL | Status: DC

## 2011-03-13 NOTE — Telephone Encounter (Signed)
Will route to Dr. Madolyn Frieze for refill request and to blue team for colonoscopy referral.  Gaylene Brooks, RN

## 2011-03-13 NOTE — Telephone Encounter (Signed)
Did not get refill on her HCTZ  When she was here last week [facility] [address]  Also needs to be set up for a colonoscopy

## 2011-03-13 NOTE — Telephone Encounter (Signed)
Ms. Gabriela [name] don't remember where she went for her colonoscopy, but would like for provider to refer her to someone else.  Said she did receive a letter stating results were abnormal.

## 2011-03-13 NOTE — Telephone Encounter (Signed)
[name] for pt to call back.  Our records indicate that she had a colonoscopy in 2007, unless it was abnormal would not need another one until 2017.    When pt returns call please find out if she did indeed have an abnormal colonoscopy and received a letter to schedule a follow up, as well as where she went.  Also advise that med was sent in. Gwenevere Goga, Maryjo Rochester

## 2011-03-14 ENCOUNTER — Encounter: Payer: BC Managed Care – PPO | Admitting: Women's Health

## 2011-03-14 NOTE — Telephone Encounter (Signed)
[name].  It is fine if pt does not want to go back to [place], BUT she will have to sign a ROI from [facility] to have them fax records here or she will need to come here and sign a ROI so we can request records from them.  Another GI will not take her without these.  Please inform her when she calls back. Pasqual Farias, Maryjo Rochester

## 2011-03-14 NOTE — Telephone Encounter (Signed)
Pt states that she went to [facility] and wants the doctor to request the letter from them.  She is also asking to speak with her doctor

## 2011-03-29 ENCOUNTER — Encounter: Payer: BC Managed Care – PPO | Admitting: Women's Health

## 2011-04-02 ENCOUNTER — Other Ambulatory Visit: Payer: Self-pay | Admitting: Gynecology

## 2011-04-02 DIAGNOSIS — Z1231 Encounter for screening mammogram for malignant neoplasm of breast: Secondary | ICD-10-CM

## 2011-04-05 ENCOUNTER — Encounter: Payer: Self-pay | Admitting: Women's Health

## 2011-04-05 ENCOUNTER — Telehealth: Payer: Self-pay | Admitting: Home Health Services

## 2011-04-05 ENCOUNTER — Ambulatory Visit (INDEPENDENT_AMBULATORY_CARE_PROVIDER_SITE_OTHER): Payer: BC Managed Care – PPO | Admitting: Women's Health

## 2011-04-05 VITALS — BP 140/90 | Ht 66.75 in | Wt 196.0 lb

## 2011-04-05 DIAGNOSIS — N898 Other specified noninflammatory disorders of vagina: Secondary | ICD-10-CM

## 2011-04-05 DIAGNOSIS — Z01419 Encounter for gynecological examination (general) (routine) without abnormal findings: Secondary | ICD-10-CM

## 2011-04-05 MED ORDER — METRONIDAZOLE 0.75 % VA GEL: VAGINAL | Status: AC

## 2011-04-05 NOTE — Progress Notes (Signed)
Gabriela [name] 12-Nov-1960 [id number]    History:    The patient presents for annual exam.  Hysterectomy in 2003 for fibroids and dysmenorrhea. Smokes approximately 2 cigarettes per day. History of normal mammograms. Not sexually active greater than one year. States is having occassional breast tenderness, an increase in gas and has had a 30 pound weight gain due to diet and anxiety over eminent job loss at [employer].   Past medical history, past surgical history, family history and social history were all reviewed and documented in the EPIC chart. Hypertension primary care manages. History of benign colon polyp in 2007. History of reflux.  ROS:  A  ROS was performed and pertinent positives and negatives are included in the history.  Exam:  Filed Vitals:   04/05/11 1416  BP: 140/90    General appearance:  Normal Head/Neck:  Normal, without cervical or supraclavicular adenopathy. Thyroid:  Symmetrical, normal in size, without palpable masses or nodularity. Respiratory  Effort:  Normal  Auscultation:  Clear without wheezing or rhonchi Cardiovascular  Auscultation:  Regular rate, without rubs, murmurs or gallops  Edema/varicosities:  Not grossly evident Abdominal  Soft,nontender, without masses, guarding or rebound.  Liver/spleen:  No organomegaly noted  Hernia:  None appreciated  Skin  Inspection:  Grossly normal  Palpation:  Grossly normal Neurologic/psychiatric  Orientation:  Normal with appropriate conversation.  Mood/affect:  Normal  Genitourinary    Breasts: Examined lying and sitting.     Right: Without masses, retractions, discharge or axillary adenopathy.     Left: Without masses, retractions, discharge or axillary adenopathy.   Inguinal/mons:  Normal without inguinal adenopathy  External genitalia:  Normal  BUS/Urethra/Skene's glands:  Normal  Bladder:  Normal  Vagina: Discharge with odor  Cervix: Absent  Uterus:  Absent  Adnexa/parametria:     Rt: Without masses or  tenderness.   Lt: Without masses or tenderness.  Anus and perineum: Normal  Digital rectal exam: Normal sphincter tone without palpated masses or tenderness  Assessment/Plan:  51 y.o. SBF G3 P1 for annual exam with complaint of breast tenderness, increased anxiety/situational.  BV Light smoker Situational stress related to job loss of 19 years. Hypertension/primary care manages labs and medications  Plan: Aware of hazards of smoking, will continue to cut down. Breast exam normal in sitting and lying position, occasional breast tenderness mammogram due next month will keep scheduled appointment. Reviewed importance of cutting calories, increasing exercise for weight loss. Relates weight gain to lifestyle. MetroGel vaginal cream 1 applicator at bedtime x5, prescription, proper use, alcohol precautions reviewed. History of benign colon polyp in 2007 will repeat colonoscopy this year will call to get scheduled. Continue SBE's, annual mammogram, increase exercise, vitamin D 1000 daily encouraged. Condoms encouraged if becomes sexually active.    Harrington Challenger [facility], 3:14 PM 04/05/2011

## 2011-04-05 NOTE — Patient Instructions (Signed)
Health Maintenance, Females A healthy lifestyle and preventative care can promote health and wellness.  Maintain regular health, dental, and eye exams.   Eat a healthy diet. Foods like vegetables, fruits, whole grains, low-fat dairy products, and lean protein foods contain the nutrients you need without too many calories. Decrease your intake of foods high in solid fats, added sugars, and salt. Get information about a proper diet from your caregiver, if necessary.   Regular physical exercise is one of the most important things you can do for your health. Most adults should get at least 150 minutes of moderate-intensity exercise (any activity that increases your heart rate and causes you to sweat) each week. In addition, most adults need muscle-strengthening exercises on 2 or more days a week.    Maintain a healthy weight. The body mass index (BMI) is a screening tool to identify possible weight problems. It provides an estimate of body fat based on height and weight. Your caregiver can help determine your BMI, and can help you achieve or maintain a healthy weight. For adults 20 years and older:   A BMI below 18.5 is considered underweight.   A BMI of 18.5 to 24.9 is normal.   A BMI of 25 to 29.9 is considered overweight.   A BMI of 30 and above is considered obese.   Maintain normal blood lipids and cholesterol by exercising and minimizing your intake of saturated fat. Eat a balanced diet with plenty of fruits and vegetables. Blood tests for lipids and cholesterol should begin at age 20 and be repeated every 5 years. If your lipid or cholesterol levels are high, you are over 50, or you are a high risk for heart disease, you may need your cholesterol levels checked more frequently.Ongoing high lipid and cholesterol levels should be treated with medicines if diet and exercise are not effective.   If you smoke, find out from your caregiver how to quit. If you do not use tobacco, do not start.    If you are pregnant, do not drink alcohol. If you are breastfeeding, be very cautious about drinking alcohol. If you are not pregnant and choose to drink alcohol, do not exceed 1 drink per day. One drink is considered to be 12 ounces (355 mL) of beer, 5 ounces (148 mL) of wine, or 1.5 ounces (44 mL) of liquor.   Avoid use of street drugs. Do not share needles with anyone. Ask for help if you need support or instructions about stopping the use of drugs.   High blood pressure causes heart disease and increases the risk of stroke. Blood pressure should be checked at least every 1 to 2 years. Ongoing high blood pressure should be treated with medicines, if weight loss and exercise are not effective.   If you are 55 to 51 years old, ask your caregiver if you should take aspirin to prevent strokes.   Diabetes screening involves taking a blood sample to check your fasting blood sugar level. This should be done once every 3 years, after age 45, if you are within normal weight and without risk factors for diabetes. Testing should be considered at a younger age or be carried out more frequently if you are overweight and have at least 1 risk factor for diabetes.   Breast cancer screening is essential preventative care for women. You should practice "breast self-awareness." This means understanding the normal appearance and feel of your breasts and may include breast self-examination. Any changes detected, no matter how   small, should be reported to a caregiver. Women in their 20s and 30s should have a clinical breast exam (CBE) by a caregiver as part of a regular health exam every 1 to 3 years. After age 40, women should have a CBE every year. Starting at age 40, women should consider having a mammogram (breast X-ray) every year. Women who have a family history of breast cancer should talk to their caregiver about genetic screening. Women at a high risk of breast cancer should talk to their caregiver about having  an MRI and a mammogram every year.   The Pap test is a screening test for cervical cancer. Women should have a Pap test starting at age 21. Between ages 21 and 29, Pap tests should be repeated every 2 years. Beginning at age 30, you should have a Pap test every 3 years as long as the past 3 Pap tests have been normal. If you had a hysterectomy for a problem that was not cancer or a condition that could lead to cancer, then you no longer need Pap tests. If you are between ages 65 and 70, and you have had normal Pap tests going back 10 years, you no longer need Pap tests. If you have had past treatment for cervical cancer or a condition that could lead to cancer, you need Pap tests and screening for cancer for at least 20 years after your treatment. If Pap tests have been discontinued, risk factors (such as a new sexual partner) need to be reassessed to determine if screening should be resumed. Some women have medical problems that increase the chance of getting cervical cancer. In these cases, your caregiver may recommend more frequent screening and Pap tests.   The human papillomavirus (HPV) test is an additional test that may be used for cervical cancer screening. The HPV test looks for the virus that can cause the cell changes on the cervix. The cells collected during the Pap test can be tested for HPV. The HPV test could be used to screen women aged 30 years and older, and should be used in women of any age who have unclear Pap test results. After the age of 30, women should have HPV testing at the same frequency as a Pap test.   Colorectal cancer can be detected and often prevented. Most routine colorectal cancer screening begins at the age of 50 and continues through age 75. However, your caregiver may recommend screening at an earlier age if you have risk factors for colon cancer. On a yearly basis, your caregiver may provide home test kits to check for hidden blood in the stool. Use of a small camera at  the end of a tube, to directly examine the colon (sigmoidoscopy or colonoscopy), can detect the earliest forms of colorectal cancer. Talk to your caregiver about this at age 50, when routine screening begins. Direct examination of the colon should be repeated every 5 to 10 years through age 75, unless early forms of pre-cancerous polyps or small growths are found.   Hepatitis C blood testing is recommended for all people born from 1945 through 1965 and any individual with known risks for hepatitis C.   Practice safe sex. Use condoms and avoid high-risk sexual practices to reduce the spread of sexually transmitted infections (STIs). Sexually active women aged 25 and younger should be checked for Chlamydia, which is a common sexually transmitted infection. Older women with new or multiple partners should also be tested for Chlamydia. Testing for other   STIs is recommended if you are sexually active and at increased risk.   Osteoporosis is a disease in which the bones lose minerals and strength with aging. This can result in serious bone fractures. The risk of osteoporosis can be identified using a bone density scan. Women ages 65 and over and women at risk for fractures or osteoporosis should discuss screening with their caregivers. Ask your caregiver whether you should be taking a calcium supplement or vitamin D to reduce the rate of osteoporosis.   Menopause can be associated with physical symptoms and risks. Hormone replacement therapy is available to decrease symptoms and risks. You should talk to your caregiver about whether hormone replacement therapy is right for you.   Use sunscreen with a sun protection factor (SPF) of 30 or greater. Apply sunscreen liberally and repeatedly throughout the day. You should seek shade when your shadow is shorter than you. Protect yourself by wearing long sleeves, pants, a wide-brimmed hat, and sunglasses year round, whenever you are outdoors.   Notify your caregiver  of new moles or changes in moles, especially if there is a change in shape or color. Also notify your caregiver if a mole is larger than the size of a pencil eraser.   Stay current with your immunizations.  Document Released: 07/24/2010 Document Revised: 12/28/2010 Document Reviewed: 07/24/2010 ExitCare Patient Information 2012 [employer].  Bacterial Vaginosis Bacterial vaginosis (BV) is a vaginal infection where the normal balance of bacteria in the vagina is disrupted. The normal balance is then replaced by an overgrowth of certain bacteria. There are several different kinds of bacteria that can cause BV. BV is the most common vaginal infection in women of childbearing age. CAUSES   The cause of BV is not fully understood. BV develops when there is an increase or imbalance of harmful bacteria.   Some activities or behaviors can upset the normal balance of bacteria in the vagina and put women at increased risk including:   Having a new sex partner or multiple sex partners.   Douching.   Using an intrauterine device (IUD) for contraception.   It is not clear what role sexual activity plays in the development of BV. However, women that have never had sexual intercourse are rarely infected with BV.  Women do not get BV from toilet seats, bedding, swimming pools or from touching objects around them.  SYMPTOMS   Grey vaginal discharge.   A fish-like odor with discharge, especially after sexual intercourse.   Itching or burning of the vagina and vulva.   Burning or pain with urination.   Some women have no signs or symptoms at all.  DIAGNOSIS  Your caregiver must examine the vagina for signs of BV. Your caregiver will perform lab tests and look at the sample of vaginal fluid through a microscope. They will look for bacteria and abnormal cells (clue cells), a pH test higher than 4.5, and a positive amine test all associated with BV.  RISKS AND COMPLICATIONS   Pelvic inflammatory  disease (PID).   Infections following gynecology surgery.   Developing HIV.   Developing herpes virus.  TREATMENT  Sometimes BV will clear up without treatment. However, all women with symptoms of BV should be treated to avoid complications, especially if gynecology surgery is planned. Female partners generally do not need to be treated. However, BV may spread between female sex partners so treatment is helpful in preventing a recurrence of BV.   BV may be treated with antibiotics. The antibiotics   come in either pill or vaginal cream forms. Either can be used with nonpregnant or pregnant women, but the recommended dosages differ. These antibiotics are not harmful to the baby.   BV can recur after treatment. If this happens, a second round of antibiotics will often be prescribed.   Treatment is important for pregnant women. If not treated, BV can cause a premature delivery, especially for a pregnant woman who had a premature birth in the past. All pregnant women who have symptoms of BV should be checked and treated.   For chronic reoccurrence of BV, treatment with a type of prescribed gel vaginally twice a week is helpful.  HOME CARE INSTRUCTIONS   Finish all medication as directed by your caregiver.   Do not have sex until treatment is completed.   Tell your sexual partner that you have a vaginal infection. They should see their caregiver and be treated if they have problems, such as a mild rash or itching.   Practice safe sex. Use condoms. Only have 1 sex partner.  PREVENTION  Basic prevention steps can help reduce the risk of upsetting the natural balance of bacteria in the vagina and developing BV:  Do not have sexual intercourse (be abstinent).   Do not douche.   Use all of the medicine prescribed for treatment of BV, even if the signs and symptoms go away.   Tell your sex partner if you have BV. That way, they can be treated, if needed, to prevent reoccurrence.  SEEK MEDICAL  CARE IF:   Your symptoms are not improving after 3 days of treatment.   You have increased discharge, pain, or fever.  MAKE SURE YOU:   Understand these instructions.   Will watch your condition.   Will get help right away if you are not doing well or get worse.  FOR MORE INFORMATION  Division of STD Prevention (DSTDP), Centers for Disease Control and Prevention: www.cdc.gov/std American Social Health Association (ASHA): www.ashastd.org  Document Released: 01/08/2005 Document Revised: 12/28/2010 Document Reviewed: 07/01/2008 ExitCare Patient Information 2012 [employer]Care[employer]. 

## 2011-04-05 NOTE — Telephone Encounter (Signed)
Spoke with [facility].  Pt report having cut back on sweets for 2+ weeks.  She is still managing a lot of stress with her pending layoff at work.  Pt is going to schedule colonoscopy this next week.  Pt reports now not the time for Health Coaching while she is waiting for her work and housing to get settled.

## 2011-04-06 ENCOUNTER — Encounter: Payer: Self-pay | Admitting: Family Medicine

## 2011-04-10 NOTE — Telephone Encounter (Signed)
Pt is having a hard time getting an appt with GI doc for her colonoscopy.  She doesn't want to go back to [facility], but needs this done asap.  Her job stops on 4/5 and won't have insurance after that.  pls advise

## 2011-04-11 NOTE — Telephone Encounter (Signed)
Spoke with patient, she states she already went to [facility] and signed a ROI for them to send [place] her records. I informed patient that once we received records I would try to schedule her with [facility]. Patient expressed understanding.

## 2011-04-16 ENCOUNTER — Ambulatory Visit (INDEPENDENT_AMBULATORY_CARE_PROVIDER_SITE_OTHER): Payer: BC Managed Care – PPO | Admitting: Family Medicine

## 2011-04-16 ENCOUNTER — Encounter: Payer: Self-pay | Admitting: Family Medicine

## 2011-04-16 VITALS — BP 132/86 | HR 67 | Temp 98.3°F | Ht 66.75 in | Wt 193.0 lb

## 2011-04-16 DIAGNOSIS — I10 Essential (primary) hypertension: Secondary | ICD-10-CM

## 2011-04-16 DIAGNOSIS — Z733 Stress, not elsewhere classified: Secondary | ICD-10-CM

## 2011-04-16 DIAGNOSIS — D126 Benign neoplasm of colon, unspecified: Secondary | ICD-10-CM

## 2011-04-16 DIAGNOSIS — E663 Overweight: Secondary | ICD-10-CM

## 2011-04-16 DIAGNOSIS — B379 Candidiasis, unspecified: Secondary | ICD-10-CM

## 2011-04-16 DIAGNOSIS — B49 Unspecified mycosis: Secondary | ICD-10-CM

## 2011-04-16 DIAGNOSIS — F439 Reaction to severe stress, unspecified: Secondary | ICD-10-CM

## 2011-04-16 DIAGNOSIS — K635 Polyp of colon: Secondary | ICD-10-CM

## 2011-04-16 MED ORDER — HYDROCHLOROTHIAZIDE 12.5 MG PO CAPS: 12.5000 mg | ORAL_CAPSULE | Freq: Every day | ORAL | Status: DC

## 2011-04-16 MED ORDER — PRAVASTATIN SODIUM 40 MG PO TABS: 40.0000 mg | ORAL_TABLET | Freq: Every day | ORAL | Status: DC

## 2011-04-16 MED ORDER — FLUCONAZOLE 150 MG PO TABS: 150.0000 mg | ORAL_TABLET | Freq: Once | ORAL | Status: AC

## 2011-04-16 NOTE — Patient Instructions (Signed)
Take blood pressures 2-3 times a week. Record them and bring them back to you. Follow-up in 1 month for your blood pressure.   Take the medication for yeast infection. May take another tablet in 3 days if symptoms persist.  We will refer you to a GI doctor for a repeat colonoscopy. Preferably .  It was nice to see you today.  God bless you.

## 2011-04-17 DIAGNOSIS — B379 Candidiasis, unspecified: Secondary | ICD-10-CM | POA: Insufficient documentation

## 2011-04-17 DIAGNOSIS — F439 Reaction to severe stress, unspecified: Secondary | ICD-10-CM | POA: Insufficient documentation

## 2011-04-17 NOTE — Assessment & Plan Note (Signed)
She is having significant stress since she will be discharged soon from her job of 19 years. She denies depression at this time. Her faith in God is helping her. The patient is trying to stay optimistic with plans of using time not working to get in better shape.  She and Arlys John are speaking routinely over the phone as part of health coaching.

## 2011-04-17 NOTE — Progress Notes (Addendum)
  Subjective:    Patient ID: Gabriela [name], female    DOB: Sep 04, 1960, 51 y.o.   MRN: [id number]  HPI 1. Requesting referral for repeat colonoscopy Had one in 2007 along with EGD for dyspepsia and polyp found and removed. About due for repeat colonoscopy. Would like to be referred to different GI clinic (not [facility]).   ROS: denies nausea/vomiting/diarrhea/constipation/blood ins tool  2. She thinks she has a yeast infection. Vaginal irritation and abnormal discharge. It feels like yeast infection she has had in the past. She thinks it may be due to taking antibiotic recently for bacterial vaginosis.    3. HTN She reports compliance with her blood pressure medication (HCTZ). Denies chest pain/palpitations/dyspnea.  She is not sure what her BP usually are (outside of clinic); she does not measure.  4. Stressors Patient about to be released from work where she has worked for 19 years.  This has been causing her significant stress but she is trying to deal with it. Her faith in God helps her.  She is also trying to use this opportunity not working to get back into better shape. She is aware she has gained weight recently.   Review of Systems Per HPI    Objective:   Physical Exam Gen: NAD Psych: appropriate CV: RRR, no m/r/g Pulm: NI WOB, CTAB without w/r/r Abd: NABS, soft, NT, ND, obese    Assessment & Plan:

## 2011-04-17 NOTE — Assessment & Plan Note (Signed)
Patient reports symptoms of yeast infection after taking antibiotic for recent BV infection.  Will treat empirically with Diflucan.

## 2011-04-17 NOTE — Assessment & Plan Note (Signed)
She is aware she needs to lose some weight and that she has gained weight recently.  She is currently receiving health coaching over the phone with Rosalita Chessman.  She hopes to work towards getting back in shape once she has more time after being discharged from her job.

## 2011-04-17 NOTE — Assessment & Plan Note (Signed)
Elevated today.  May need to go up on blood pressure medication (HCTZ).  Patient amenable to recording blood pressures outside of clinic for now. Follow-up in 1 month.

## 2011-04-18 ENCOUNTER — Encounter: Payer: Self-pay | Admitting: Internal Medicine

## 2011-04-23 ENCOUNTER — Telehealth: Payer: Self-pay | Admitting: *Deleted

## 2011-04-23 ENCOUNTER — Ambulatory Visit (AMBULATORY_SURGERY_CENTER): Payer: BC Managed Care – PPO | Admitting: *Deleted

## 2011-04-23 VITALS — Ht 67.0 in | Wt 196.4 lb

## 2011-04-23 DIAGNOSIS — Z1211 Encounter for screening for malignant neoplasm of colon: Secondary | ICD-10-CM

## 2011-04-23 MED ORDER — PEG-KCL-NACL-NASULF-NA ASC-C 100 G PO SOLR: ORAL | Status: DC

## 2011-04-23 NOTE — Telephone Encounter (Signed)
Pt scheduled for colonoscopy with Dr. Pyrtle on 05/03/2011.  Last colonoscopy 2007 at [facility].  Pt says she is due for recall now.  Release of information form signed and given to Stacy Howald.    

## 2011-04-23 NOTE — Progress Notes (Signed)
Pt scheduled for colonoscopy with Dr. Rhea Belton on 05/03/2011.  Last colonoscopy 2007 at [facility].  Pt says she is due for recall now.  Release of information form signed and given to [facility].

## 2011-04-25 ENCOUNTER — Encounter (HOSPITAL_COMMUNITY): Payer: Self-pay

## 2011-04-25 ENCOUNTER — Emergency Department (INDEPENDENT_AMBULATORY_CARE_PROVIDER_SITE_OTHER)
Admission: EM | Admit: 2011-04-25 | Discharge: 2011-04-25 | Disposition: A | Discharge status: Home / Self Care | Payer: BC Managed Care – PPO | Source: Home / Self Care | Attending: Family Medicine | Admitting: Family Medicine

## 2011-04-25 ENCOUNTER — Emergency Department (INDEPENDENT_AMBULATORY_CARE_PROVIDER_SITE_OTHER): Payer: BC Managed Care – PPO

## 2011-04-25 DIAGNOSIS — R109 Unspecified abdominal pain: Secondary | ICD-10-CM

## 2011-04-25 NOTE — ED Provider Notes (Signed)
History     CSN: [id number]  Arrival date & time 04/25/11  4098   First MD Initiated Contact with Patient 04/25/11 1845      Chief Complaint  Patient presents with  . Bloated    (Consider location/radiation/quality/duration/timing/severity/associated sxs/prior treatment) Patient is a 51 y.o. female presenting with abdominal pain. The history is provided by the patient.  Abdominal Pain The primary symptoms of the illness include abdominal pain and nausea. The primary symptoms of the illness do not include fever. The current episode started 2 days ago (bloating for 2 weeks, last week saw lmd and colonoscopy scheduled, became nauseated on sun with pain, ). The onset of the illness was gradual. The problem has been gradually worsening.  The patient has had a change in bowel habit. Risk factors for an acute abdominal problem include a history of abdominal surgery (s/p tah.). Symptoms associated with the illness do not include urgency or frequency. Associated medical issues comments: s/p tah..    Past Medical History  Diagnosis Date  . Smoker     ONE PACK PER WEEK  . Anemia   . Hypertension   . Benign colon polyp 08/2005    adenomatous  . Dysmenorrhea   . Fibroid     Past Surgical History  Procedure Date  . 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 Umbilical hernia repair     AS A CHILD  . Abdominal hysterectomy 06/2001    Lily Peer, M.D.    Family History  Problem Relation Age of Onset  . Heart disease Mother   . Hypertension Mother   . Stroke Father   . Colon cancer Neg Hx   . Stomach cancer Neg Hx     History  Substance Use Topics  . Smoking status: Current Everyday Smoker -- 0.3 packs/day    Types: Cigarettes  . Smokeless tobacco: Never Used   Comment: only smokes 1 -2 cigarettes daily.  . Alcohol Use: 1.2 oz/week    2 Glasses of wine per week    OB History    Grav Para Term Preterm Abortions TAB SAB Ect Mult Living   3 1   2     1       Review of  Systems  Constitutional: Negative for fever.  Gastrointestinal: Positive for nausea and abdominal pain.  Genitourinary: Negative for urgency and frequency.    Allergies  Review of patient's allergies indicates no known allergies.  Home Medications   Current Outpatient Rx  Name Route Sig Dispense Refill  . CETIRIZINE HCL 10 MG PO TABS Oral Take 10 mg by mouth daily.      Marland Kitchen FLUTICASONE PROPIONATE 50 MCG/ACT NA SUSP  1 spray each nostril daily     . HYDROCHLOROTHIAZIDE 12.5 MG PO CAPS Oral Take 1 capsule (12.5 mg total) by mouth daily. For blood pressure 30 capsule 2  . HYDROXYZINE HCL 10 MG PO TABS Oral Take 1 tablet (10 mg total) by mouth 3 (three) times daily as needed. 60 tablet 3  . LACTOBACILLUS PO PACK Oral Take 1 each by mouth daily. 30 packet 2  . ONE-DAILY MULTI VITAMINS PO TABS Oral Take 1 tablet by mouth daily.      Marland Kitchen PRAVASTATIN SODIUM 40 MG PO TABS Oral Take 1 tablet (40 mg total) by mouth daily. For cholesterol. 30 tablet 3    BP 147/108  Pulse 79  Temp(Src) 98.3 F (36.8 C) (Oral)  Resp 20  SpO2 99%  Physical Exam  Nursing  note and vitals reviewed. Constitutional: She is oriented to person, place, and time. She appears well-developed and well-nourished.  Abdominal: Soft. She exhibits distension and mass. Bowel sounds are decreased. There is no hepatosplenomegaly. There is tenderness in the suprapubic area. There is rigidity. There is no rebound and no guarding.  Neurological: She is alert and oriented to person, place, and time.  Skin: Skin is warm and dry.  Psychiatric: She has a normal mood and affect.    ED Course  Procedures (including critical care time)  Labs Reviewed - No data to display No results found.   1. Abdominal pain       MDM          Linna Hoff, MD 05/16/11 [phone]

## 2011-04-25 NOTE — ED Notes (Signed)
Has been having problems for a long time ; hist of polyps 5 yrs ago on colonoscopy (is to have a repeat next week by [facility]), but is concerned since she feels bloated, and has gaining weight

## 2011-04-25 NOTE — Discharge Instructions (Signed)
Call your doctor in am as discussed. Use laxative tonight.drink plenty of fluids.

## 2011-04-26 ENCOUNTER — Telehealth: Payer: Self-pay | Admitting: Family Medicine

## 2011-04-26 ENCOUNTER — Ambulatory Visit: Payer: BC Managed Care – PPO

## 2011-04-26 NOTE — Telephone Encounter (Signed)
Left message at [facility] to check on status of records.

## 2011-04-26 NOTE — Telephone Encounter (Signed)
Went to UC last night for abd pain and they told her to see her doctor today - also needs CT

## 2011-04-26 NOTE — Telephone Encounter (Signed)
Patient states she was told at Urgent Care to take a laxative and she did and has had good results. However she was also told to follow up with MD today . Doctor stated he did not like the way her abdomen feels and she needs a CT.   Gas has improved but still there. Appointment scheduled for Work In,  SDA at 1:30.

## 2011-04-27 ENCOUNTER — Ambulatory Visit (INDEPENDENT_AMBULATORY_CARE_PROVIDER_SITE_OTHER): Payer: BC Managed Care – PPO | Admitting: Family Medicine

## 2011-04-27 ENCOUNTER — Encounter: Payer: Self-pay | Admitting: Family Medicine

## 2011-04-27 VITALS — BP 136/84 | HR 58 | Temp 98.7°F | Ht 67.0 in | Wt 194.0 lb

## 2011-04-27 DIAGNOSIS — R1013 Epigastric pain: Secondary | ICD-10-CM

## 2011-04-27 DIAGNOSIS — K3189 Other diseases of stomach and duodenum: Secondary | ICD-10-CM

## 2011-04-27 DIAGNOSIS — R141 Gas pain: Secondary | ICD-10-CM

## 2011-04-27 DIAGNOSIS — R143 Flatulence: Secondary | ICD-10-CM

## 2011-04-27 DIAGNOSIS — R142 Eructation: Secondary | ICD-10-CM

## 2011-04-27 NOTE — Progress Notes (Signed)
  Subjective:    Patient ID: Gabriela [name], female    DOB: 1960-07-26, 51 y.o.   MRN: [id number]  HPI Follow-up for abdominal discomfort/flatulence. Seen at Urgent Care a couple of days ago (04/03) for this issue.  Since the patient saw me on 03/25, the patient has been reporting increase in flatulence and abdominal discomfort, and it became particularly worse on 03/31.  The patient tried to be seen here early this week but we were full.   At Urgent Care, the patient underwent AXR which showed no obstruction or perforation but did show some constipation. She was asked to take a laxative at discharge and ED physician also recommended outpatient CT-abdomen (UC was closing so they could not get it done there) due to concerning "feel" of abdomen during physical exam.  Patient presents today with improved symptoms after bowel movement with laxative. Her abdominal discomfort/flatulence is significantly improved.   She reports worsening of her symptoms with heavy foods (burgers, fries). She also reports chewing a lot of gum throughout the day; more recently than in the past, correlating with onset of symptoms about 6 months ago.  Pepto-Bismol and broth helps calm her stomach.  She drinks 4 glasses of water daily. She avoids dairy foods and carbonated beverages.  The patient did experience similar symptoms in the past, which resulted in her getting a colonoscopy in 2007 which showed a polyp. She has a repeat colonoscopy scheduled on 04/11.  She was wondering if she needs CT-abdomen.    Review of Systems Denies fevers/chills Denies nausea/vomiting/diarrhea. Last bowel movement a couple of days ago. Usually has bowel movements twice a week Denies dysuria    Objective:   Physical Exam Gen: NAD Abd: NABS, obese, soft, non-tender, non-distended    Assessment & Plan:

## 2011-04-27 NOTE — Assessment & Plan Note (Signed)
Removed 2007. Will refer to GI for repeat follow-up colonoscopy.

## 2011-04-27 NOTE — Assessment & Plan Note (Signed)
Improved. Patient presented to [facility] 04/03 due to worsening flatulence/abdominal discomfort attributed to constipation at that time and which is now improved after having bowel movement with laxative.  She presents today mainly because UC physician recommended possible CT-abdomen since her abdominal exam felt unusual. After discussion with patient, plan will be to defer on this test at this time since symptoms are improved, and we suspect besides constipation, flatulence may be due to her diet. Heavy, fatty foods cause more symptoms, and patient reports chewing more gum recently. Other things to consider: patient has history of dyspepsia and H. pylori diagnosed 2007. May considering starting medication for dyspepsia (PPI), which she sometimes complains of with flatulence. She currently takes Tums prn.  Patient also had colonoscopy scheduled at [facility] for 04/11 for follow-up of colonic polyp diagnosed 2007. Will consider CT-abdomen if symptoms worsen and if negative colonoscopy.  PLAN: -For flatulence: dietary changes: avoid gum, heavy/fatty foods, beans. Continue to avoid milk products.  -For constipation: increase water, fiber, laxative prn.

## 2011-04-27 NOTE — Patient Instructions (Signed)
It was nice to see you today.  For your abdominal discomfort/gas: -Avoid chewing gum. -Also avoid beans, heavy foods, carbonated beverages, high-sugar drinks.   For constipation: -Drink 8-10 glasses of water daily -Eat foods high in fiber (see below)  Follow-up if you have persistent or worsening symptoms.  High Fiber Diet A high fiber diet changes your normal diet to include more whole grains, legumes, fruits, and vegetables. Changes in the diet involve replacing refined carbohydrates with unrefined foods. The calorie level of the diet is essentially unchanged. The Dietary Reference Intake (recommended amount) for adult males is 38 g per day. For adult females, it is 25 g per day. Pregnant and lactating women should consume 28 g of fiber per day. Fiber is the intact part of a plant that is not broken down during digestion. Functional fiber is fiber that has been isolated from the plant to provide a beneficial effect in the body. PURPOSE  Increase stool bulk.   Ease and regulate bowel movements.   Lower cholesterol.  INDICATIONS THAT YOU NEED MORE FIBER  Constipation and hemorrhoids.   Uncomplicated diverticulosis (intestine condition) and irritable bowel syndrome.   Weight management.   As a protective measure against hardening of the arteries (atherosclerosis), diabetes, and cancer.  NOTE OF CAUTION If you have a digestive or bowel problem, ask your caregiver for advice before adding high fiber foods to your diet. Some of the following medical problems are such that a high fiber diet should not be used without consulting your caregiver:  Acute diverticulitis (intestine infection).   Partial small bowel obstructions.   Complicated diverticular disease involving bleeding, rupture (perforation), or abscess (boil, furuncle).   Presence of autonomic neuropathy (nerve damage) or gastric paresis (stomach cannot empty itself).  GUIDELINES FOR INCREASING FIBER  Start adding fiber  to the diet slowly. A gradual increase of about 5 more grams (2 slices of whole-wheat bread, 2 servings of most fruits or vegetables, or 1 bowl of high fiber cereal) per day is best. Too rapid an increase in fiber may result in constipation, flatulence, and bloating.   Drink enough water and fluids to keep your urine clear or pale yellow. Water, juice, or caffeine-free drinks are recommended. Not drinking enough fluid may cause constipation.   Eat a variety of high fiber foods rather than one type of fiber.   Try to increase your intake of fiber through using high fiber foods rather than fiber pills or supplements that contain small amounts of fiber.   The goal is to change the types of food eaten. Do not supplement your present diet with high fiber foods, but replace foods in your present diet.  INCLUDE A VARIETY OF FIBER SOURCES  Replace refined and processed grains with whole grains, canned fruits with fresh fruits, and incorporate other fiber sources. White rice, white breads, and most bakery goods contain little or no fiber.   Brown whole-grain rice, buckwheat oats, and many fruits and vegetables are all good sources of fiber. These include: broccoli, Brussels sprouts, cabbage, cauliflower, beets, sweet potatoes, white potatoes (skin on), carrots, tomatoes, eggplant, squash, berries, fresh fruits, and dried fruits.   Cereals appear to be the richest source of fiber. Cereal fiber is found in whole grains and bran. Bran is the fiber-rich outer coat of cereal grain, which is largely removed in refining. In whole-grain cereals, the bran remains. In breakfast cereals, the largest amount of fiber is found in those with "bran" in their names. The fiber content  is sometimes indicated on the label.   You may need to include additional fruits and vegetables each day.   In baking, for 1 cup white flour, you may use the following substitutions:   1 cup whole-wheat flour minus 2 tbs.    cup white  flour plus  cup whole-wheat flour.  Document Released: 01/08/2005 Document Revised: 12/28/2010 Document Reviewed: 11/16/2008 [facility] Patient Information 2012 [place], [place].

## 2011-05-01 ENCOUNTER — Encounter: Payer: Self-pay | Admitting: Internal Medicine

## 2011-05-03 ENCOUNTER — Other Ambulatory Visit: Payer: BC Managed Care – PPO | Admitting: Internal Medicine

## 2011-05-03 ENCOUNTER — Encounter: Payer: Self-pay | Admitting: Internal Medicine

## 2011-05-03 ENCOUNTER — Ambulatory Visit (AMBULATORY_SURGERY_CENTER): Payer: BC Managed Care – PPO | Admitting: Internal Medicine

## 2011-05-03 VITALS — BP 146/90 | HR 70 | Temp 98.7°F | Resp 20 | Ht 67.0 in | Wt 196.0 lb

## 2011-05-03 DIAGNOSIS — Z86010 Personal history of colonic polyps: Secondary | ICD-10-CM

## 2011-05-03 DIAGNOSIS — Z1211 Encounter for screening for malignant neoplasm of colon: Secondary | ICD-10-CM

## 2011-05-03 DIAGNOSIS — Z860101 Personal history of adenomatous and serrated colon polyps: Secondary | ICD-10-CM

## 2011-05-03 DIAGNOSIS — D126 Benign neoplasm of colon, unspecified: Secondary | ICD-10-CM

## 2011-05-03 MED ORDER — SODIUM CHLORIDE 0.9 % IV SOLN: 500.0000 mL | INTRAVENOUS | Status: DC

## 2011-05-03 NOTE — Op Note (Signed)
[facility] [address] [place], [place]  [address]  COLONOSCOPY PROCEDURE REPORT  PATIENT:  Gabriela [name], Gabriela [name]  MR#:  [id number] BIRTHDATE:  30-Dec-1960, 50 yrs. old  GENDER:  female ENDOSCOPIST:  Carie Caddy. Jayonna Meyering, MD REF. BY:  Priscella Mann, MD PROCEDURE DATE:  05/03/2011 PROCEDURE:  Colon with cold biopsy polypectomy ASA CLASS:  Class II INDICATIONS:  surveillance and high-risk screening, personal hx of adenomatous colon polyps 2007 MEDICATIONS:   MAC sedation, administered by CRNA, propofol (Diprivan) 200 mg IV  DESCRIPTION OF PROCEDURE:   After the risks benefits and alternatives of the procedure were thoroughly explained, informed consent was obtained.  Digital rectal exam was performed and revealed no rectal masses.   The LB 180AL [id number] endoscope was introduced through the anus and advanced to the terminal ileum which was intubated for a short distance, without limitations. The quality of the prep was good, using MoviPrep.  The instrument was then slowly withdrawn as the colon was fully examined. <<PROCEDUREIMAGES>>  FINDINGS:  The terminal ileum appeared normal.  A 3 mm sessile polyp was found in the ascending colon. The polyp was removed using cold biopsy forceps.  A 3 mm sessile polyp was found in the descending colon. The polyp was removed using cold biopsy forceps. Mild diverticulosis was found in the right colon.   Retroflexed views in the rectum revealed no abnormalities.  The scope was then withdrawn from the cecum and the procedure completed.  COMPLICATIONS:  None  ENDOSCOPIC IMPRESSION: 1) Normal terminal ileum 2) Sessile polyp in the ascending colon. Removed and sent to pathology. 3) Sessile polyp in the descending colon. Removed and sent to pathology. 4) Mild diverticulosis in the right colon RECOMMENDATIONS: 1) Await pathology results 2) High fiber diet. 3) Given your personal history of adenomatous (pre-cancerous) polyps, you will need a  repeat colonoscopy in 5 years.  Carie Caddy. Rhea Belton, MD  CC:  The Patient Priscella Mann, MD  n. Rosalie Doctor:   Carie Caddy. Iriana Artley at 05/03/2011 08:38 AM  [place], [phone]

## 2011-05-03 NOTE — Progress Notes (Signed)
Patient did not experience any of the following events: a burn prior to discharge; a fall within the facility; wrong site/side/patient/procedure/implant event; or a hospital transfer or hospital admission upon discharge from the facility. (G8907) Patient did not have preoperative order for IV antibiotic SSI prophylaxis. (G8918)  

## 2011-05-03 NOTE — Progress Notes (Signed)
PROPOFOL PER S CAMP CRNA. SEE SCANNED INTRA PROCEDURE REPORT. EWM 

## 2011-05-03 NOTE — Patient Instructions (Signed)
DISCHARGE INSTRUCTIONS GIVEN WITH VERBAL UNDERSTANDING. HANDOUTS ON POLYPS AND DIVERTICULOSIS GIVEN. RESUME PREVIOUS MEDICATIONS.YOU HAD AN ENDOSCOPIC PROCEDURE TODAY AT THE [facility]: Refer to the procedure report that was given to you for any specific questions about what was found during the examination.  If the procedure report does not answer your questions, please call your gastroenterologist to clarify.  If you requested that your care partner not be given the details of your procedure findings, then the procedure report has been included in a sealed envelope for you to review at your convenience later.  YOU SHOULD EXPECT: Some feelings of bloating in the abdomen. Passage of more gas than usual.  Walking can help get rid of the air that was put into your GI tract during the procedure and reduce the bloating. If you had a lower endoscopy (such as a colonoscopy or flexible sigmoidoscopy) you may notice spotting of blood in your stool or on the toilet paper. If you underwent a bowel prep for your procedure, then you may not have a normal bowel movement for a few days.  DIET: Your first meal following the procedure should be a light meal and then it is ok to progress to your normal diet.  A half-sandwich or bowl of soup is an example of a good first meal.  Heavy or fried foods are harder to digest and may make you feel nauseous or bloated.  Likewise meals heavy in dairy and vegetables can cause extra gas to form and this can also increase the bloating.  Drink plenty of fluids but you should avoid alcoholic beverages for 24 hours.  ACTIVITY: Your care partner should take you home directly after the procedure.  You should plan to take it easy, moving slowly for the rest of the day.  You can resume normal activity the day after the procedure however you should NOT DRIVE or use heavy machinery for 24 hours (because of the sedation medicines used during the test).    SYMPTOMS TO REPORT  IMMEDIATELY: A gastroenterologist can be reached at any hour.  During normal business hours, 8:30 AM to 5:00 PM Monday through Friday, call [phone].  After hours and on weekends, please call the GI answering service at [phone] who will take a message and have the physician on call contact you.   Following lower endoscopy (colonoscopy or flexible sigmoidoscopy):  Excessive amounts of blood in the stool  Significant tenderness or worsening of abdominal pains  Swelling of the abdomen that is new, acute  Fever of 100F or higher  FOLLOW UP: If any biopsies were taken you will be contacted by phone or by letter within the next 1-3 weeks.  Call your gastroenterologist if you have not heard about the biopsies in 3 weeks.  Our staff will call the home number listed on your records the next business day following your procedure to check on you and address any questions or concerns that you may have at that time regarding the information given to you following your procedure. This is a courtesy call and so if there is no answer at the home number and we have not heard from you through the emergency physician on call, we will assume that you have returned to your regular daily activities without incident.  SIGNATURES/CONFIDENTIALITY: You and/or your care partner have signed paperwork which will be entered into your electronic medical record.  These signatures attest to the fact that that the information above on your After Visit Summary   has been reviewed and is understood.  Full responsibility of the confidentiality of this discharge information lies with you and/or your care-partner.  

## 2011-05-04 ENCOUNTER — Telehealth: Payer: Self-pay | Admitting: *Deleted

## 2011-05-04 NOTE — Telephone Encounter (Signed)
  Follow up Call-  Call back number 05/03/2011  Post procedure Call Back phone  # [phone]  Permission to leave phone message Yes     Patient questions:  Do you have a fever, pain , or abdominal swelling? no Pain Score  0 *  Have you tolerated food without any problems? yes  Have you been able to return to your normal activities? yes  Do you have any questions about your discharge instructions: Diet   no Medications  no Follow up visit  no  Do you have questions or concerns about your Care? no  Actions: * If pain score is 4 or above: No action needed, pain <4.

## 2011-05-08 ENCOUNTER — Encounter: Payer: Self-pay | Admitting: Internal Medicine

## 2011-05-08 ENCOUNTER — Encounter: Payer: Self-pay | Admitting: Family Medicine

## 2011-05-09 ENCOUNTER — Ambulatory Visit: Payer: BC Managed Care – PPO

## 2011-05-09 ENCOUNTER — Encounter: Payer: Self-pay | Admitting: Family Medicine

## 2011-05-09 ENCOUNTER — Ambulatory Visit (INDEPENDENT_AMBULATORY_CARE_PROVIDER_SITE_OTHER): Payer: BC Managed Care – PPO | Admitting: Family Medicine

## 2011-05-09 VITALS — BP 149/84 | HR 79 | Temp 98.5°F | Ht 67.0 in | Wt 192.0 lb

## 2011-05-09 DIAGNOSIS — R141 Gas pain: Secondary | ICD-10-CM

## 2011-05-09 DIAGNOSIS — R143 Flatulence: Secondary | ICD-10-CM

## 2011-05-09 MED ORDER — HYDROXYZINE HCL 10 MG PO TABS: 10.0000 mg | ORAL_TABLET | Freq: Three times a day (TID) | ORAL | Status: DC | PRN

## 2011-05-09 MED ORDER — LACTOBACILLUS PO PACK: 1.0000 | PACK | Freq: Every day | ORAL | Status: DC

## 2011-05-09 NOTE — Assessment & Plan Note (Addendum)
Now with more bloating than flatulence. Differential: GERD, H. pylori, IBS Will schedule CT-abdomen/pelvis to rule-out anatomic pathology.  Try peppermint oil and probiotic for possible IBS.

## 2011-05-09 NOTE — Progress Notes (Signed)
  Subjective:    Patient ID: Gabriela [name], female    DOB: 11-07-1960, 51 y.o.   MRN: [id number]  HPI Persistent abdominal discomfort lower abdomen bilaterally. Now less gas and more bloating.  Patient reports not eating anything. Does not think food related. Not chewing gum as much as she did.  She would like CT-abdomen because something "just does not feel right".  Alleviated by: improves when she is walking but the discomfort returns right afterwards.  Exacerbated by:  nothing  Review of Systems Denies nausea/vomiting Denies diarrhea/constipation (BM every other day)    Objective:   Physical Exam Gen: NAD Psych: appears anxious Abd: NABS, soft, NT, ND; no masses palpable    Assessment & Plan:

## 2011-05-09 NOTE — Patient Instructions (Addendum)
Let's check a CT of your abdomen. I will call you with the results after I receive them.  Keep a food diary.   Try the peppermint oil and probiotic.   Make a follow-up appointment in 2 weeks.

## 2011-05-11 ENCOUNTER — Ambulatory Visit (HOSPITAL_COMMUNITY)
Admission: RE | Admit: 2011-05-11 | Discharge: 2011-05-11 | Disposition: A | Discharge status: Home / Self Care | Payer: BC Managed Care – PPO | Source: Ambulatory Visit | Attending: Family Medicine | Admitting: Family Medicine

## 2011-05-11 DIAGNOSIS — R143 Flatulence: Secondary | ICD-10-CM

## 2011-05-11 DIAGNOSIS — R109 Unspecified abdominal pain: Secondary | ICD-10-CM | POA: Insufficient documentation

## 2011-05-11 MED ORDER — IOHEXOL 300 MG/ML  SOLN
100.0000 mL | Freq: Once | INTRAMUSCULAR | Status: AC | PRN
  Administered 2011-05-11: 100 mL via INTRAVENOUS

## 2011-05-14 ENCOUNTER — Telehealth: Payer: Self-pay | Admitting: Family Medicine

## 2011-05-14 NOTE — Telephone Encounter (Signed)
Message copied by [facility], Etta Quill on Mon May 14, 2011  1:35 PM ------      Message from: Barbaraann Barthel      Created: Fri May 11, 2011  5:05 PM                   ----- Message -----         From: Rad Results In Interface         Sent: 05/11/2011  10:40 AM           To: Barbaraann Barthel, MD

## 2011-05-14 NOTE — Telephone Encounter (Signed)
Discussed CT abdomen results. Normal except for simple liver cyst.  Patient reports significant improvement in abdominal discomfort after probiotic. Did not try peppermint oil.  Follow-up as needed.  No routine follow-up needed for liver cyst. Consider repeat imaging after 1 year if patient symptomatic. Patient amenable to plan.

## 2011-05-21 ENCOUNTER — Ambulatory Visit: Payer: BC Managed Care – PPO

## 2011-05-22 ENCOUNTER — Ambulatory Visit
Admission: RE | Admit: 2011-05-22 | Discharge: 2011-05-22 | Disposition: A | Discharge status: Home / Self Care | Payer: BC Managed Care – PPO | Source: Ambulatory Visit | Attending: Gynecology | Admitting: Gynecology

## 2011-05-22 DIAGNOSIS — Z1231 Encounter for screening mammogram for malignant neoplasm of breast: Secondary | ICD-10-CM

## 2011-07-19 ENCOUNTER — Other Ambulatory Visit: Payer: Self-pay | Admitting: *Deleted

## 2011-07-19 MED ORDER — HYDROCHLOROTHIAZIDE 12.5 MG PO CAPS: 12.5000 mg | ORAL_CAPSULE | Freq: Every day | ORAL | Status: DC

## 2011-08-08 ENCOUNTER — Encounter: Payer: Self-pay | Admitting: *Deleted

## 2011-08-08 NOTE — Progress Notes (Signed)
Patient ID: Gabriela [name], female   DOB: 06/01/1960, 51 y.o.   MRN: [id number] Pt called requesting medication to help with her new onset hot flashes, I explained to pt that she would need ov with nancy to discuss medication and risks. Pt is currently unemployed now, I told her that that font desk could tell her about a payment plan to help her. Pt transferred to appointment desk.

## 2011-08-09 ENCOUNTER — Ambulatory Visit (INDEPENDENT_AMBULATORY_CARE_PROVIDER_SITE_OTHER): Payer: Self-pay | Admitting: Family Medicine

## 2011-08-09 ENCOUNTER — Encounter: Payer: Self-pay | Admitting: Family Medicine

## 2011-08-09 VITALS — BP 146/80 | HR 79 | Temp 98.3°F | Ht 67.0 in | Wt 203.0 lb

## 2011-08-09 DIAGNOSIS — I10 Essential (primary) hypertension: Secondary | ICD-10-CM

## 2011-08-09 DIAGNOSIS — N951 Menopausal and female climacteric states: Secondary | ICD-10-CM

## 2011-08-09 DIAGNOSIS — Z87891 Personal history of nicotine dependence: Secondary | ICD-10-CM

## 2011-08-09 MED ORDER — HYDROCHLOROTHIAZIDE 25 MG PO TABS: 25.0000 mg | ORAL_TABLET | Freq: Every day | ORAL | Status: DC

## 2011-08-09 MED ORDER — EVENING PRIMROSE OIL-VIT E 500 MG PO CAPS: 2.0000 | ORAL_CAPSULE | Freq: Two times a day (BID) | ORAL | Status: DC

## 2011-08-09 NOTE — Assessment & Plan Note (Signed)
congratulated patient on cessation. Best thing for her!

## 2011-08-09 NOTE — Patient Instructions (Addendum)
Gabriela [name],  Thank you for coming in today.  Two natural herbs to assist with hot flashes are: 1. Evening primrose oil  2. Black cohosh   Prescription medications include 1. SNRI: effexor/cymbalta 2. Gabapentin.  3. Low dose estrogen hormone replacement etc.   Great job! On your smoking cessation.  Please increase HCTZ to 25 mg once daily.   Please f/u with you PCP (Dr. Madolyn Frieze) if you would like to try prescription therapy.   Dr. Armen Pickup

## 2011-08-09 NOTE — Assessment & Plan Note (Signed)
A: hot flashes related to menopause. After lengthy discussion patient opts for "natural therapy". She is s/p hysterectomy and an ex smoker and may be a  candidate for low dose estrogen replacement therapy. A detaled famiyl history of breast/GU cancer should be done first. P:  1. Evening primrose oil per AVS 2. Increase HCTZ to 35 mg daily  3. Add strength training to exercise regimen to help with weight loss and stress relief.  4. Patient to research other options (see AVS) if the above does not get adequate relief.

## 2011-08-09 NOTE — Progress Notes (Signed)
Subjective:     Patient ID: NAOMI CASTROGIOVANNI, female   DOB: 01/08/1961, 51 y.o.   MRN: [id number]  HPI 51 yo F presents for evaluation of hot flashes that have worsened over the past 2 months. She reports that she get hot starting from her forehead down to her nipple line. She recently quit smoking 1 week ago. She exercise by walking vigorously 3x per week for 45 minutes.   She is compliant with her BP meds. She does not check BP at home.  Social history: laid off from job last week.   Review of Systems As per HPI      Objective:   Physical Exam BP 146/80  Pulse 79  Temp 98.3 F (36.8 C) (Oral)  Ht 5\' 7"  (1.702 m)  Wt 203 lb (92.08 kg)  BMI 31.79 kg/m2 Wt Readings from Last 3 Encounters:  08/09/11 203 lb (92.08 kg)  05/09/11 192 lb (87.091 kg)  05/03/11 196 lb (88.905 kg)  General appearance: alert, cooperative and no distress Skin: Skin color, texture, turgor normal. No rashes or lesions  Assessment and Plan:

## 2011-08-09 NOTE — Assessment & Plan Note (Signed)
A: persistently above goal of < 140/90 P: increase HCTZ to 25 mg daily. Hopefully improved BP control with help reduce symptom fo hot flashes .

## 2011-08-23 ENCOUNTER — Telehealth: Payer: Self-pay | Admitting: Family Medicine

## 2011-08-23 NOTE — Telephone Encounter (Signed)
LM: make appointment or leave me a more detailed message

## 2011-08-23 NOTE — Telephone Encounter (Signed)
Pt is asking to speak with Dr Madolyn Frieze about her hot flashes.  She just needs some advise before she makes an appt.

## 2011-08-28 ENCOUNTER — Telehealth: Payer: Self-pay | Admitting: Family Medicine

## 2011-08-28 NOTE — Telephone Encounter (Signed)
Patient is calling Dr. Madolyn Frieze back from last week about her hot flashes.

## 2011-08-30 NOTE — Telephone Encounter (Signed)
Advised her to make appointment to be seen

## 2011-10-02 ENCOUNTER — Telehealth: Payer: Self-pay | Admitting: Family Medicine

## 2011-10-02 NOTE — Telephone Encounter (Signed)
Patient is calling because the Black Cohash Vitamin for her Hot Flashes and it doesn't seem to be helping and she would like to try Hormone Replacement Patches.

## 2011-10-03 NOTE — Telephone Encounter (Signed)
Advised patient make an appointment to be seen regarding her hot flashes and potential treatments.

## 2011-10-05 ENCOUNTER — Encounter: Payer: Self-pay | Admitting: Family Medicine

## 2011-10-05 ENCOUNTER — Telehealth: Payer: Self-pay | Admitting: Family Medicine

## 2011-10-05 ENCOUNTER — Ambulatory Visit (INDEPENDENT_AMBULATORY_CARE_PROVIDER_SITE_OTHER): Payer: Self-pay | Admitting: Family Medicine

## 2011-10-05 VITALS — BP 145/92 | HR 74 | Temp 98.0°F | Ht 67.0 in | Wt 201.0 lb

## 2011-10-05 DIAGNOSIS — N951 Menopausal and female climacteric states: Secondary | ICD-10-CM

## 2011-10-05 DIAGNOSIS — E785 Hyperlipidemia, unspecified: Secondary | ICD-10-CM

## 2011-10-05 DIAGNOSIS — Z0000 Encounter for general adult medical examination without abnormal findings: Secondary | ICD-10-CM

## 2011-10-05 DIAGNOSIS — Z23 Encounter for immunization: Secondary | ICD-10-CM

## 2011-10-05 MED ORDER — PRAVASTATIN SODIUM 40 MG PO TABS: 40.0000 mg | ORAL_TABLET | Freq: Every day | ORAL | Status: DC

## 2011-10-05 MED ORDER — ESTRADIOL 0.5 MG PO TABS: 0.5000 mg | ORAL_TABLET | Freq: Every day | ORAL | Status: DC

## 2011-10-05 MED ORDER — ESTRADIOL 0.025 MG/24HR TD PTTW: 1.0000 | MEDICATED_PATCH | Freq: Every day | TRANSDERMAL | Status: DC

## 2011-10-05 MED ORDER — CALCIUM CITRATE-VITAMIN D 500-400 MG-UNIT PO CHEW: 1.0000 | CHEWABLE_TABLET | Freq: Two times a day (BID) | ORAL | Status: DC

## 2011-10-05 NOTE — Telephone Encounter (Signed)
The Rx for the patches is over $400 and she cannot afford that.  She would like to know what other options she has.  Patient would like to speak to Dr. Madolyn Frieze.

## 2011-10-05 NOTE — Patient Instructions (Addendum)
Start estradiol patch for hot flashes  Do not smoke while on the patch  Follow-up in 2-4 weeks or sooner if needed  Start calcium 600 mg and vitamin D 400 mg twice a day for your bone health     Menopause Menopause is the normal time of life when menstrual periods stop completely. Menopause is complete when you have missed 12 consecutive menstrual periods. It usually occurs between the ages of 12 to 55, with an average age of 67. Very rarely does a woman develop menopause before 51 years old. At menopause, your ovaries stop producing the female hormones, estrogen and progesterone. This can cause undesirable symptoms and also affect your health. Sometimes the symptoms may occur 4 to 5 years before the menopause begins. There is no relationship between menopause and:  Oral contraceptives.   Number of children you had.   Race.   The age your menstrual periods started (menarche).  Heavy smokers and very thin women may develop menopause earlier in life. CAUSES  The ovaries stop producing the female hormones estrogen and progesterone.   Other causes include:   Surgery to remove both ovaries.   The ovaries stop functioning for no known reason.   Tumors of the pituitary gland in the brain.   Medical disease that affects the ovaries and hormone production.   Radiation treatment to the abdomen or pelvis.   Chemotherapy that affects the ovaries.  SYMPTOMS   Hot flashes.   Night sweats.   Decrease in sex drive.   Vaginal dryness and thinning of the vagina causing painful intercourse.   Dryness of the skin and developing wrinkles.   Headaches.   Tiredness.   Irritability.   Memory problems.   Weight gain.   Bladder infections.   Hair growth of the face and chest.   Infertility.  More serious symptoms include:  Loss of bone (osteoporosis) causing breaks (fractures).   Depression.   Hardening and narrowing of the arteries (atherosclerosis) causing heart  attacks and strokes.  DIAGNOSIS   When the menstrual periods have stopped for 12 straight months.   Physical exam.   Hormone studies of the blood.  TREATMENT  There are many treatment choices and nearly as many questions about them. The decisions to treat or not to treat menopausal changes is an individual choice made with your caregiver. Your caregiver can discuss the treatments with you. Together, you can decide which treatment will work best for you. Your treatment choices may include:   Hormone therapy (estorgen and progesterone).   Non-hormonal medications.   Treating the individual symptoms with medication (for example antidepressants for depression).   Herbal medications that may help specific symptoms.   Counseling by a psychiatrist or psychologist.   Group therapy.   Lifestyle changes including:   Eating healthy.   Regular exercise.   Limiting caffeine and alcohol.   Stress management and meditation.   No treatment.  HOME CARE INSTRUCTIONS   Take the medication your caregiver gives you as directed.   Get plenty of sleep and rest.   Exercise regularly.   Eat a diet that contains calcium (good for the bones) and soy products (acts like estrogen hormone).   Avoid alcoholic beverages.   Do not smoke.   If you have hot flashes, dress in layers.   Take supplements, calcium and vitamin D to strengthen bones.   You can use over-the-counter lubricants or moisturizers for vaginal dryness.   Group therapy is sometimes very helpful.   Acupuncture  may be helpful in some cases.  SEEK MEDICAL CARE IF:   You are not sure you are in menopause.   You are having menopausal symptoms and need advice and treatment.   You are still having menstrual periods after age 65.   You have pain with intercourse.   Menopause is complete (no menstrual period for 12 months) and you develop vaginal bleeding.   You need a referral to a specialist (gynecologist, psychiatrist  or psychologist) for treatment.  SEEK IMMEDIATE MEDICAL CARE IF:   You have severe depression.   You have excessive vaginal bleeding.   You fell and think you have a broken bone.   You have pain when you urinate.   You develop leg or chest pain.   You have a fast pounding heart beat (palpitations).   You have severe headaches.   You develop vision problems.   You feel a lump in your breast.   You have abdominal pain or severe indigestion.  Document Released: 03/31/2003 Document Revised: 12/28/2010 Document Reviewed: 11/06/2007 [facility] Patient Information 2012 [place], [place].

## 2011-10-05 NOTE — Telephone Encounter (Signed)
Rx for estradiol oral tablets called in. Notified patient. Should be $4.

## 2011-10-07 NOTE — Progress Notes (Signed)
  Subjective:    Patient ID: Gabriela [name], female    DOB: 02-02-1960, 51 y.o.   MRN: [id number]  HPI # Hot flashes  She has had symptoms for the past 6-7 months. They occur 3-4 times a day and last 10-15 minutes.   # Preventative She is taking calcium but not vitamin D supplementation.  Review of Systems No vaginal irritation or abnormal discharge  Allergies, medication, past medical history reviewed.  She quit smoking 1 month ago.  She has a history of a hysterectomy but with her ovaries intact. She denies sexual intercourse.    Objective:   Physical Exam GEN: NAD; well-nourished, -appearing; obese PSYCH: appropriate to questions    Assessment & Plan:

## 2011-10-07 NOTE — Assessment & Plan Note (Signed)
Her hot flashes remain unchanged despite primrose oil. She would like to try HRT. Rx given. She was encouraged to stay abstinent from smoking (she quit 1 month ago) while on this medication especially due to risk of VTE.

## 2011-10-07 NOTE — Assessment & Plan Note (Signed)
Encouraged calcium 1200 and vitamin D 800 especially now that she seems to be going through menopause.

## 2011-10-08 ENCOUNTER — Encounter: Payer: Self-pay | Admitting: Family Medicine

## 2011-10-31 ENCOUNTER — Ambulatory Visit: Payer: Self-pay | Admitting: Family Medicine

## 2011-11-19 ENCOUNTER — Telehealth: Payer: Self-pay | Admitting: Family Medicine

## 2011-11-19 MED ORDER — HYDROCHLOROTHIAZIDE 25 MG PO TABS: 25.0000 mg | ORAL_TABLET | Freq: Every day | ORAL | Status: DC

## 2011-11-19 NOTE — Telephone Encounter (Signed)
Pt is switching pharmacies and has no more refills on her HCTZ  [facility] [address]

## 2011-11-19 NOTE — Telephone Encounter (Signed)
Please notify patient Rx sent to new pharmacy.  Thank you.

## 2011-11-20 ENCOUNTER — Other Ambulatory Visit: Payer: Self-pay | Admitting: Family Medicine

## 2011-12-07 ENCOUNTER — Encounter: Payer: Self-pay | Admitting: Family Medicine

## 2011-12-07 ENCOUNTER — Ambulatory Visit (INDEPENDENT_AMBULATORY_CARE_PROVIDER_SITE_OTHER): Payer: Self-pay | Admitting: Family Medicine

## 2011-12-07 ENCOUNTER — Telehealth: Payer: Self-pay | Admitting: Family Medicine

## 2011-12-07 VITALS — BP 151/82 | HR 117 | Temp 98.0°F | Ht 67.0 in | Wt 203.3 lb

## 2011-12-07 DIAGNOSIS — M79606 Pain in leg, unspecified: Secondary | ICD-10-CM

## 2011-12-07 DIAGNOSIS — M79609 Pain in unspecified limb: Secondary | ICD-10-CM

## 2011-12-07 DIAGNOSIS — M79605 Pain in left leg: Secondary | ICD-10-CM

## 2011-12-07 DIAGNOSIS — M7062 Trochanteric bursitis, left hip: Secondary | ICD-10-CM | POA: Insufficient documentation

## 2011-12-07 MED ORDER — DICLOFENAC SODIUM 75 MG PO TBEC: 75.0000 mg | DELAYED_RELEASE_TABLET | Freq: Two times a day (BID) | ORAL | Status: DC

## 2011-12-07 MED ORDER — CYCLOBENZAPRINE HCL 10 MG PO TABS: 10.0000 mg | ORAL_TABLET | Freq: Three times a day (TID) | ORAL | Status: DC | PRN

## 2011-12-07 MED ORDER — KETOROLAC TROMETHAMINE 60 MG/2ML IM SOLN
60.0000 mg | Freq: Once | INTRAMUSCULAR | Status: AC
  Administered 2011-12-07: 60 mg via INTRAMUSCULAR

## 2011-12-07 NOTE — Progress Notes (Signed)
  Subjective:    Patient ID: Gabriela [name], female    DOB: 1960-05-19, 51 y.o.   MRN: [id number]  HPI  Patient presents to same day appointment for LT lower extremity pain.  Located anterior thigh and radiates to LT foot.  Pain is currently 9/10 and constant.  She describes it as a burning pain.  Symptoms started 2-3 weeks ago after starting a new job.  She used to have a different job that allowed her to sit most of the time, but now she is required to walk/stand on her feet for 12 hours straight.  Denies any specific trauma or injury to LT leg.  Patient denies any numbness/tingling sensation.  Denies inability to bear weight on leg.  Denies any swelling or erythema.  Denies any hx of DVT, OCP use, or prolonged car or plane rides.    Review of Systems Per HPI    Objective:   Physical Exam  Constitutional: She appears well-nourished. No distress.  Musculoskeletal:       LT lower extremity: no skin or tissue texture changes; no asymmetry compared to RT; + SLR due to pain; tenderness on palpation of quadriceps; normal ROM of knee and hip  Skin:       No evidence of bruising, rash, or cellulitis          Assessment & Plan:

## 2011-12-07 NOTE — Telephone Encounter (Signed)
Patient reports pain in leg for a month but has gotten progressively worse. Pain is on anterior left leg in center of leg. Offered work in appointment today. She feels she needs to come in today.  Appointment scheduled.

## 2011-12-07 NOTE — Telephone Encounter (Signed)
Pt states that her left leg is hurting - wants to come in today

## 2011-12-07 NOTE — Assessment & Plan Note (Signed)
Likely overuse injury from new job requiring patient to stand/walk for 12 hours straight.  Patient denies any hx of DVT, does not take Estrace, and denies any prolonged car or plane rides.  On exam, no asymmetry of bilateral LE, no swelling, or erythema. - Will treat pain with Toradol shot here in clinic - Diclofenac BID PRN and Flexeril 10 mg TID PRN + RICE - If symptoms worsen or do not improve in 1-2 weeks, patient to return to clinic - Red flags reviewed with patient and per AVS

## 2011-12-07 NOTE — Patient Instructions (Addendum)
It was nice to meet you today, Gabriela [name]. Start taking Flexeril as needed for muscle spasms and Diclofenac twice daily as needed for pain. If you develop redness, swelling, worsening pain, or unable to bear weight on your leg, please return to clinic or go to ER. Schedule follow up appointment with your primary doctor in 2 weeks.  RICE: Routine Care for Injuries The routine care of many injuries includes Rest, Ice, Compression, and Elevation (RICE). HOME CARE INSTRUCTIONS  Rest is needed to allow your body to heal. Routine activities can usually be resumed when comfortable. Injured tendons and bones can take up to 6 weeks to heal. Tendons are the cord-like structures that attach muscle to bone.  Ice following an injury helps keep the swelling down and reduces pain.  Put ice in a plastic bag.  Place a towel between your skin and the bag.  Leave the ice on for 15 to 20 minutes, 3 to 4 times a day. Do this while awake, for the first 24 to 48 hours. After that, continue as directed by your caregiver.  Compression helps keep swelling down. It also gives support and helps with discomfort. If an elastic bandage has been applied, it should be removed and reapplied every 3 to 4 hours. It should not be applied tightly, but firmly enough to keep swelling down. Watch fingers or toes for swelling, bluish discoloration, coldness, numbness, or excessive pain. If any of these problems occur, remove the bandage and reapply loosely. Contact your caregiver if these problems continue.  Elevation helps reduce swelling and decreases pain. With extremities, such as the arms, hands, legs, and feet, the injured area should be placed near or above the level of the heart, if possible. SEEK IMMEDIATE MEDICAL CARE IF:  You have persistent pain and swelling.  You develop redness, numbness, or unexpected weakness.  Your symptoms are getting worse rather than improving after several days. These symptoms may indicate that  further evaluation or further X-rays are needed. Sometimes, X-rays may not show a small broken bone (fracture) until 1 week or 10 days later. Make a follow-up appointment with your caregiver. Ask when your X-ray results will be ready. Make sure you get your X-ray results. Document Released: 04/22/2000 Document Revised: 04/02/2011 Document Reviewed: 06/09/2010 [facility] Patient Information 2013 [place], [place].

## 2011-12-08 ENCOUNTER — Emergency Department (HOSPITAL_COMMUNITY): Payer: Self-pay

## 2011-12-08 ENCOUNTER — Encounter (HOSPITAL_COMMUNITY): Payer: Self-pay | Admitting: *Deleted

## 2011-12-08 ENCOUNTER — Observation Stay (HOSPITAL_COMMUNITY)
Admission: EM | Admit: 2011-12-08 | Discharge: 2011-12-09 | Disposition: A | Discharge status: Home / Self Care | Payer: Self-pay | Attending: Emergency Medicine | Admitting: Emergency Medicine

## 2011-12-08 ENCOUNTER — Other Ambulatory Visit: Payer: Self-pay

## 2011-12-08 DIAGNOSIS — R0600 Dyspnea, unspecified: Secondary | ICD-10-CM

## 2011-12-08 DIAGNOSIS — Z860100 Personal history of colon polyps, unspecified: Secondary | ICD-10-CM | POA: Insufficient documentation

## 2011-12-08 DIAGNOSIS — I10 Essential (primary) hypertension: Secondary | ICD-10-CM | POA: Insufficient documentation

## 2011-12-08 DIAGNOSIS — D649 Anemia, unspecified: Secondary | ICD-10-CM | POA: Insufficient documentation

## 2011-12-08 DIAGNOSIS — R0602 Shortness of breath: Principal | ICD-10-CM | POA: Insufficient documentation

## 2011-12-08 DIAGNOSIS — Z79899 Other long term (current) drug therapy: Secondary | ICD-10-CM | POA: Insufficient documentation

## 2011-12-08 DIAGNOSIS — M1612 Unilateral primary osteoarthritis, left hip: Secondary | ICD-10-CM

## 2011-12-08 DIAGNOSIS — M169 Osteoarthritis of hip, unspecified: Secondary | ICD-10-CM | POA: Insufficient documentation

## 2011-12-08 DIAGNOSIS — R0789 Other chest pain: Secondary | ICD-10-CM

## 2011-12-08 DIAGNOSIS — D259 Leiomyoma of uterus, unspecified: Secondary | ICD-10-CM | POA: Insufficient documentation

## 2011-12-08 DIAGNOSIS — M1610 Unilateral primary osteoarthritis, unspecified hip: Secondary | ICD-10-CM | POA: Insufficient documentation

## 2011-12-08 HISTORY — DX: Cardiac arrhythmia, unspecified: I49.9

## 2011-12-08 HISTORY — DX: Chest pain, unspecified: R07.9

## 2011-12-08 LAB — D-DIMER, QUANTITATIVE: D-Dimer, Quant: <0.27 ug/mL-FEU (ref 0.00–0.48)

## 2011-12-08 LAB — CBC WITH DIFFERENTIAL/PLATELET
Eosinophils Absolute: 0.1 10*3/uL (ref 0.0–0.7)
Eosinophils Relative: 2 % (ref 0–5)
HCT: 37.5 % (ref 36.0–46.0)
Lymphs Abs: 2.7 10*3/uL (ref 0.7–4.0)
MCH: 27.7 pg (ref 26.0–34.0)
MCV: 84.5 fL (ref 78.0–100.0)
Monocytes Absolute: 0.3 10*3/uL (ref 0.1–1.0)
Platelets: 315 10*3/uL (ref 150–400)
RBC: 4.44 MIL/uL (ref 3.87–5.11)
RDW: 12.9 % (ref 11.5–15.5)

## 2011-12-08 LAB — COMPREHENSIVE METABOLIC PANEL
ALT: 20 U/L (ref 0–35)
Calcium: 9.6 mg/dL (ref 8.4–10.5)
Creatinine, Ser: 0.84 mg/dL (ref 0.50–1.10)
GFR calc Af Amer: >90 mL/min (ref >90)
GFR calc non Af Amer: 79 mL/min — ABNORMAL LOW (ref >90)
Glucose, Bld: 128 mg/dL — ABNORMAL HIGH (ref 70–99)
Sodium: 142 mEq/L (ref 135–145)
Total Protein: 7.1 g/dL (ref 6.0–8.3)

## 2011-12-08 MED ORDER — KETOROLAC TROMETHAMINE 60 MG/2ML IM SOLN
60.0000 mg | Freq: Once | INTRAMUSCULAR | Status: AC
  Administered 2011-12-08: 60 mg via INTRAMUSCULAR
  Filled 2011-12-08: qty 2

## 2011-12-08 NOTE — ED Notes (Addendum)
Pt states yesterday she had a similar feeling last night when she was getting ready for bed she started feeling SOB when she laid down. Pt states she saw PCP today for left leg pain and started a new job with standing for 12 hours. Pt states tonight after visiting her PCP she had the same feeling when she laid down in bed she felt SOB and unable to catch her breath. Pt given muscle spasm medications and anti-inflammatory medication by pcp but pt has not gotten paid so could not get medications. Pain is is in left lower leg.

## 2011-12-08 NOTE — ED Notes (Signed)
Patient transported to X-ray 

## 2011-12-08 NOTE — ED Provider Notes (Signed)
History     CSN: [id number]  Arrival date & time 12/08/11  2038   First MD Initiated Contact with Patient 12/08/11 2217      Chief Complaint  Patient presents with  . Shortness of Breath  . Leg Pain    left    (Consider location/radiation/quality/duration/timing/severity/associated sxs/prior treatment) HPI Comments: The patient had an episode of shortness of breath yesterday the occurred spontaneously and lasted 15 minutes. She had a second episode today while getting upset with her grandson. She states that she's also had some chest tightness associated with this but denies actual pain. Denies cough, fever, chills, lightheadedness or syncope, diaphoresis. She also states she's had one month of left hip pain that has gotten worse lately. It is worse when she stands, especially at the end of her 12 hour workday.  Patient is a 51 y.o. female presenting with shortness of breath. The history is provided by the patient. No language interpreter was used.  Shortness of Breath  The current episode started yesterday. The onset was sudden. The problem occurs rarely. The problem has been resolved. The problem is moderate. Nothing relieves the symptoms. Nothing aggravates the symptoms. Associated symptoms include shortness of breath. Pertinent negatives include no chest pain, no chest pressure, no fever, no rhinorrhea, no stridor, no cough and no wheezing. There was no intake of a foreign body. The Heimlich maneuver was not attempted. She has had no prior steroid use. She has had no prior hospitalizations. She has had no prior ICU admissions. Her past medical history does not include past wheezing. She has been behaving normally. Urine output has been normal. The last void occurred less than 6 hours ago. There were no sick contacts. She has received no recent medical care.    Past Medical History  Diagnosis Date  . Smoker     ONE PACK PER WEEK  . Anemia   . Hypertension   . Benign colon polyp  08/2005    adenomatous  . Dysmenorrhea   . Fibroid     Past Surgical History  Procedure Date  . 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 Umbilical hernia repair     AS A CHILD  . Abdominal hysterectomy 06/2001    Lily Peer, M.D.    Family History  Problem Relation Age of Onset  . Heart disease Mother   . Hypertension Mother   . Stroke Father   . Colon cancer Neg Hx   . Stomach cancer Neg Hx     History  Substance Use Topics  . Smoking status: Former Smoker    Quit date: 08/02/2011  . Smokeless tobacco: Never Used     Comment: only smokes 1 -2 cigarettes daily.  . Alcohol Use: 1.2 oz/week    2 Glasses of wine per week    OB History    Grav Para Term Preterm Abortions TAB SAB Ect Mult Living   3 1   2     1       Review of Systems  Constitutional: Negative for fever, chills, activity change and appetite change.  HENT: Negative for congestion, rhinorrhea, neck pain, neck stiffness and sinus pressure.   Eyes: Negative for discharge and visual disturbance.  Respiratory: Positive for chest tightness and shortness of breath. Negative for cough, wheezing and stridor.   Cardiovascular: Negative for chest pain and leg swelling.  Gastrointestinal: Negative for nausea, vomiting, abdominal pain, diarrhea and abdominal distention.  Genitourinary: Negative for decreased urine volume and difficulty urinating.  Musculoskeletal: Positive for arthralgias (left hip). Negative for back pain.  Skin: Negative for color change and pallor.  Neurological: Negative for weakness, light-headedness and headaches.  Psychiatric/Behavioral: Negative for behavioral problems and agitation.  All other systems reviewed and are negative.    Allergies  Review of patient's allergies indicates no known allergies.  Home Medications   Current Outpatient Rx  Name  Route  Sig  Dispense  Refill  . CALCIUM CITRATE-VITAMIN D 500-400 MG-UNIT PO CHEW   Oral   Chew 1 tablet by mouth 2 (two) times  daily.   60 tablet   12   . CETIRIZINE HCL 10 MG PO TABS   Oral   Take 10 mg by mouth daily.           . CYCLOBENZAPRINE HCL 10 MG PO TABS   Oral   Take 1 tablet (10 mg total) by mouth 3 (three) times daily as needed for muscle spasms.   30 tablet   0   . DICLOFENAC SODIUM 75 MG PO TBEC   Oral   Take 1 tablet (75 mg total) by mouth 2 (two) times daily.   30 tablet   0   . ESTRADIOL 0.5 MG PO TABS   Oral   Take 1 tablet (0.5 mg total) by mouth daily.   30 tablet   12   . FLUTICASONE PROPIONATE 50 MCG/ACT NA SUSP      1 spray each nostril daily          . HYDROCHLOROTHIAZIDE 25 MG PO TABS   Oral   Take 1 tablet (25 mg total) by mouth daily.   90 tablet   1   . HYDROCHLOROTHIAZIDE 25 MG PO TABS      TAKE 1 TABLET BY MOUTH DAILY   90 tablet   3   . ONE-DAILY MULTI VITAMINS PO TABS   Oral   Take 1 tablet by mouth daily.           Marland Kitchen PRAVASTATIN SODIUM 40 MG PO TABS   Oral   Take 1 tablet (40 mg total) by mouth daily. For cholesterol.   30 tablet   3     BP 120/95  Pulse 78  Temp 98.4 F (36.9 C) (Oral)  Resp 19  SpO2 100%  Physical Exam  Nursing note and vitals reviewed. Constitutional: She is oriented to person, place, and time. She appears well-developed and well-nourished. No distress.  HENT:  Head: Normocephalic and atraumatic.  Mouth/Throat: No oropharyngeal exudate.  Eyes: EOM are normal. Pupils are equal, round, and reactive to light. Right eye exhibits no discharge. Left eye exhibits no discharge.  Neck: Normal range of motion. Neck supple. No JVD present.  Cardiovascular: Normal rate, regular rhythm and normal heart sounds.   Pulmonary/Chest: Effort normal and breath sounds normal. No stridor. No respiratory distress. She has no wheezes. She has no rales. She exhibits no tenderness.  Abdominal: Soft. Bowel sounds are normal. She exhibits no distension. There is no tenderness. There is no guarding.  Musculoskeletal: Normal range of  motion. She exhibits no edema and no tenderness.       Moderate pain when ranging the left hip  Neurological: She is alert and oriented to person, place, and time. No cranial nerve deficit. She exhibits normal muscle tone.  Skin: Skin is warm and dry. No rash noted. She is not diaphoretic.  Psychiatric: She has a normal mood and affect. Her behavior is normal. Judgment and thought content normal.  ED Course  Procedures (including critical care time)  Labs Reviewed  CBC WITH DIFFERENTIAL - Abnormal; Notable for the following:    Neutrophils Relative 42 (*)     Lymphocytes Relative 50 (*)     All other components within normal limits  COMPREHENSIVE METABOLIC PANEL - Abnormal; Notable for the following:    Glucose, Bld 128 (*)     Alkaline Phosphatase 119 (*)     GFR calc non Af Amer 79 (*)     All other components within normal limits  D-DIMER, QUANTITATIVE   Dg Chest 2 View  12/08/2011  *RADIOLOGY REPORT*  Clinical Data: Episodic shortness of breath.  CHEST - 2 VIEW  Comparison: 10/18/2007.  Findings:  The heart size and mediastinal contours are within normal limits.  Both lungs are clear.  The visualized skeletal structures are unremarkable. No change from priors.  IMPRESSION: No active cardiopulmonary disease.   Original Report Authenticated By: Davonna Belling, M.D.    Dg Hip Complete Left  12/08/2011  *RADIOLOGY REPORT*  Clinical Data: Shortness of breath.  Leg pain.  LEFT HIP - COMPLETE 2+ VIEW  Comparison:  None.  Findings:  There is no evidence of hip fracture or dislocation. There is no evidence of arthropathy or other focal bone abnormality.  IMPRESSION: Negative.   Original Report Authenticated By: Davonna Belling, M.D.      1. Chest tightness   2. Dyspnea   3. Osteoarthritis of left hip      Date: 12/08/2011  Rate: 63  Rhythm: normal sinus rhythm  QRS Axis: normal  Intervals: normal  ST/T Wave abnormalities: normal  Conduction Disutrbances: none  Narrative  Interpretation: nml  Old EKG Reviewed: none   MDM  C/o dyspnea. DDim neg, doubt PE. Also c/o chest tightness, no pain currently. No cardiac hx. Will do CP protocol.  Also w/ L hip pain, likely OA. Doubt septic joint or fx. XR neg.  Sent to holding awaiting 2nd Tn and CT cor in AM.    Warrick Parisian, MD 12/08/11 [phone]

## 2011-12-09 ENCOUNTER — Observation Stay (HOSPITAL_COMMUNITY): Payer: Self-pay

## 2011-12-09 ENCOUNTER — Other Ambulatory Visit: Payer: Self-pay

## 2011-12-09 ENCOUNTER — Encounter (HOSPITAL_COMMUNITY): Payer: Self-pay | Admitting: *Deleted

## 2011-12-09 LAB — TROPONIN I: Troponin I: <0.3 ng/mL (ref <0.30)

## 2011-12-09 LAB — POCT I-STAT TROPONIN I
Troponin i, poc: 0 ng/mL (ref 0.00–0.08)
Troponin i, poc: 0 ng/mL (ref 0.00–0.08)

## 2011-12-09 MED ORDER — METOPROLOL TARTRATE 1 MG/ML IV SOLN
5.0000 mg | Freq: Once | INTRAVENOUS | Status: DC
  Filled 2011-12-09: qty 10

## 2011-12-09 MED ORDER — NITROGLYCERIN 0.4 MG SL SUBL
0.4000 mg | SUBLINGUAL_TABLET | Freq: Once | SUBLINGUAL | Status: AC
  Administered 2011-12-09: 0.4 mg via SUBLINGUAL
  Filled 2011-12-09: qty 25

## 2011-12-09 MED ORDER — METOPROLOL TARTRATE 25 MG PO TABS
50.0000 mg | ORAL_TABLET | Freq: Once | ORAL | Status: AC
  Administered 2011-12-09: 50 mg via ORAL
  Filled 2011-12-09: qty 2

## 2011-12-09 MED ORDER — IOHEXOL 350 MG/ML SOLN
80.0000 mL | Freq: Once | INTRAVENOUS | Status: AC | PRN
  Administered 2011-12-09: 80 mL via INTRAVENOUS

## 2011-12-09 NOTE — ED Notes (Addendum)
Explained protocol to patient/ advised NPO @4am  till after test has resulted/ EKG @ 6AM/pt understood

## 2011-12-09 NOTE — ED Notes (Signed)
Patients BMI 31.8, wt 203 lb/Ht 5 ft 7 in/ administer 50 mg of metoprolol per protocol for CT Heart

## 2011-12-09 NOTE — ED Provider Notes (Signed)
0700  Report received from Dr. Read Drivers for this 51 yo female with c/o chest pressure and SOB x 2 episodes lasting 15 minutes that is better with rest.  TIMI 1,  Smoker and hypertension and mom died at 85 with MI.  On CDU protocol awaiting Cardiac CT.  BMI 31.8.  Lopressor 50mg  at 5:30am for heart rate in teh 60's.    62  Kriste Basque rn stated that the patient goes to Dr. Allyson Sabal in the past with cardiac cath many years ago that was clean and last visit 1-2 years ago.  Will page [facility] cardiology to discuss plan.    915.  Discussed plan with Dr. Rennis Golden and he agrees to go ahead and do cardiac CT and follow up with Dr. Allyson Sabal if any significant findings.  He could not find a record of this patient.  1145  Cardiac CT complete and shows no cardiac disease.Troponin - x 3.  Ekg with no changes.  Labs unremarkable.  Chest x-ray normal.  Patient is ready for discharge and will follow up with her pcp for repeat CT chest with contrast in near future to evaluate lesion.      Remi Haggard, NP 12/09/11 1141

## 2011-12-09 NOTE — ED Notes (Signed)
Received report from Robin,RN in POD D

## 2011-12-09 NOTE — ED Provider Notes (Signed)
I saw and evaluated the patient, reviewed the resident's note and I agree with the findings and plan.  Episode of "chest tightness" on exertion with SOB yesterday x 15 minutes, better with rest. Second episode today. Now resolved. TIMI 1. Risk factors HTN, tobacco.  Glynn Octave, MD 12/09/11 [phone]

## 2011-12-10 NOTE — ED Provider Notes (Signed)
Medical screening examination/treatment/procedure(s) were performed by non-physician practitioner and as supervising physician I was immediately available for consultation/collaboration.   Carleene Cooper III, MD 12/10/11 [phone]

## 2011-12-14 ENCOUNTER — Ambulatory Visit: Payer: Self-pay | Admitting: Family Medicine

## 2012-01-02 ENCOUNTER — Ambulatory Visit: Payer: Self-pay | Admitting: Family Medicine

## 2012-01-11 ENCOUNTER — Ambulatory Visit (INDEPENDENT_AMBULATORY_CARE_PROVIDER_SITE_OTHER): Payer: Self-pay | Admitting: Family Medicine

## 2012-01-11 ENCOUNTER — Encounter: Payer: Self-pay | Admitting: Family Medicine

## 2012-01-11 VITALS — BP 149/84 | HR 85 | Temp 98.5°F | Ht 67.0 in | Wt 201.0 lb

## 2012-01-11 DIAGNOSIS — F439 Reaction to severe stress, unspecified: Secondary | ICD-10-CM

## 2012-01-11 DIAGNOSIS — M79605 Pain in left leg: Secondary | ICD-10-CM

## 2012-01-11 DIAGNOSIS — M79609 Pain in unspecified limb: Secondary | ICD-10-CM

## 2012-01-11 DIAGNOSIS — R222 Localized swelling, mass and lump, trunk: Secondary | ICD-10-CM

## 2012-01-11 DIAGNOSIS — R079 Chest pain, unspecified: Secondary | ICD-10-CM

## 2012-01-11 DIAGNOSIS — Z733 Stress, not elsewhere classified: Secondary | ICD-10-CM

## 2012-01-11 NOTE — Assessment & Plan Note (Signed)
She was recently seen in the ED 12/09/2011 for an episode of chest pain that was ruled-out for cardiac etiology. Patient thinks it is stress related. We spent several minutes discussing her stress and better ways to manage. She does not seem depressed and appreciative of life but sometimes has difficulty not letting routine work stressors overwhelm her. We also discussed regular exercise; she has not been doing because of the cold but hopes to do more walking soon.

## 2012-01-11 NOTE — Assessment & Plan Note (Signed)
It is improving with diclofenac and flexeril. She was advised to continue and to follow-up as needed if symptoms worsen or do not significantly improve.

## 2012-01-11 NOTE — Patient Instructions (Addendum)
I will call you with results within 2 days after CT of chest.   Follow-up in 3 months or sooner if needed.

## 2012-01-11 NOTE — Progress Notes (Signed)
  Subjective:    Patient ID: Gabriela [name], female    DOB: [birth year], 51 y.o.   MRN: [id number]  HPI She is here for follow-up of ED visit on 11/17 where she presented with chest pain. It was thought not to be cardiac-related.  She had seen cardiologist Dr. Allyson Sabal in the past and had a cath many years ago that was clean. CT-cardiac did not show any significant cardiac abnormalities, troponins were negative x 3, and ECG did not show significant abnormalities.   Patient denies any more episodes of chest pain. She thinks it may have been related to anxiety.  She has been under a lot of stress lately from work. She is on her feet 12-hours a day 3-days a weeks. She is looking for other employment. She thinks her left leg discomfort is due to standing up at work as well. Flexeril and diclofenac have been helping. She wears tennis shoes at work; she did not bring with her today.   Review of Systems Denies palpitations, dyspnea, nausea, depression   Allergies, medication, past medical history reviewed.      Objective:   Physical Exam GEN: NAD; overweight CV: RRR, no murmurs, normal S1/S2; no carotid bruits  PULM: NI WOB; CTAB MSK: LEFT LEG   No abnormal skin findings including bruising or swelling    Sensation intact   Strength intact   2+ pedal and popliteal pulses EXT: negative Homan's; no calf tenderness or swelling      Assessment & Plan:

## 2012-04-18 ENCOUNTER — Ambulatory Visit: Payer: Self-pay | Admitting: Family Medicine

## 2012-04-25 ENCOUNTER — Other Ambulatory Visit: Payer: Self-pay

## 2012-04-25 ENCOUNTER — Ambulatory Visit (HOSPITAL_COMMUNITY)
Admission: RE | Admit: 2012-04-25 | Discharge: 2012-04-25 | Disposition: A | Discharge status: Home / Self Care | Payer: PRIVATE HEALTH INSURANCE | Source: Ambulatory Visit | Attending: Family Medicine | Admitting: Family Medicine

## 2012-04-25 ENCOUNTER — Ambulatory Visit (INDEPENDENT_AMBULATORY_CARE_PROVIDER_SITE_OTHER): Payer: PRIVATE HEALTH INSURANCE | Admitting: Family Medicine

## 2012-04-25 ENCOUNTER — Encounter (HOSPITAL_COMMUNITY): Payer: Self-pay | Admitting: Emergency Medicine

## 2012-04-25 ENCOUNTER — Emergency Department (HOSPITAL_COMMUNITY)
Admission: EM | Admit: 2012-04-25 | Discharge: 2012-04-26 | Disposition: A | Discharge status: Home / Self Care | Payer: PRIVATE HEALTH INSURANCE | Attending: Emergency Medicine | Admitting: Emergency Medicine

## 2012-04-25 ENCOUNTER — Encounter: Payer: Self-pay | Admitting: Family Medicine

## 2012-04-25 ENCOUNTER — Telehealth: Payer: Self-pay | Admitting: Family Medicine

## 2012-04-25 VITALS — BP 140/90 | Ht 67.0 in | Wt 200.0 lb

## 2012-04-25 VITALS — BP 158/88 | HR 86 | Temp 98.1°F | Ht 67.0 in | Wt 204.0 lb

## 2012-04-25 DIAGNOSIS — Z862 Personal history of diseases of the blood and blood-forming organs and certain disorders involving the immune mechanism: Secondary | ICD-10-CM | POA: Insufficient documentation

## 2012-04-25 DIAGNOSIS — M25559 Pain in unspecified hip: Secondary | ICD-10-CM | POA: Insufficient documentation

## 2012-04-25 DIAGNOSIS — Z860100 Personal history of colon polyps, unspecified: Secondary | ICD-10-CM | POA: Insufficient documentation

## 2012-04-25 DIAGNOSIS — M7062 Trochanteric bursitis, left hip: Secondary | ICD-10-CM

## 2012-04-25 DIAGNOSIS — Z8742 Personal history of other diseases of the female genital tract: Secondary | ICD-10-CM | POA: Insufficient documentation

## 2012-04-25 DIAGNOSIS — M79605 Pain in left leg: Secondary | ICD-10-CM

## 2012-04-25 DIAGNOSIS — IMO0002 Reserved for concepts with insufficient information to code with codable children: Secondary | ICD-10-CM | POA: Insufficient documentation

## 2012-04-25 DIAGNOSIS — E785 Hyperlipidemia, unspecified: Secondary | ICD-10-CM

## 2012-04-25 DIAGNOSIS — M79609 Pain in unspecified limb: Secondary | ICD-10-CM

## 2012-04-25 DIAGNOSIS — M76899 Other specified enthesopathies of unspecified lower limb, excluding foot: Secondary | ICD-10-CM | POA: Insufficient documentation

## 2012-04-25 DIAGNOSIS — M25552 Pain in left hip: Secondary | ICD-10-CM

## 2012-04-25 DIAGNOSIS — Z1231 Encounter for screening mammogram for malignant neoplasm of breast: Secondary | ICD-10-CM

## 2012-04-25 DIAGNOSIS — Z87891 Personal history of nicotine dependence: Secondary | ICD-10-CM | POA: Insufficient documentation

## 2012-04-25 DIAGNOSIS — I10 Essential (primary) hypertension: Secondary | ICD-10-CM | POA: Insufficient documentation

## 2012-04-25 DIAGNOSIS — Z79899 Other long term (current) drug therapy: Secondary | ICD-10-CM | POA: Insufficient documentation

## 2012-04-25 DIAGNOSIS — Z8679 Personal history of other diseases of the circulatory system: Secondary | ICD-10-CM | POA: Insufficient documentation

## 2012-04-25 MED ORDER — PRAVASTATIN SODIUM 40 MG PO TABS: 40.0000 mg | ORAL_TABLET | Freq: Every day | ORAL | Status: DC

## 2012-04-25 MED ORDER — METHYLPREDNISOLONE ACETATE 40 MG/ML IJ SUSP
40.0000 mg | Freq: Once | INTRAMUSCULAR | Status: AC
  Administered 2012-04-25: 40 mg via INTRA_ARTICULAR

## 2012-04-25 MED ORDER — ACETAMINOPHEN 325 MG PO TABS
975.0000 mg | ORAL_TABLET | Freq: Once | ORAL | Status: AC
  Administered 2012-04-26: 975 mg via ORAL
  Filled 2012-04-25 (×3): qty 3

## 2012-04-25 NOTE — Patient Instructions (Addendum)
Hip xray  Referral to Sports Medicine  Hip strengthening exercises

## 2012-04-25 NOTE — Progress Notes (Signed)
Gabriela [name] is a 52 y.o. female who presents to [facility] today for left-sided hip pain for 3 months. 8 months ago patient started a new job which required standing for 12 hours at a time. She subsequently developed lateral left hip pain. She notes pain is worse with activity better with rest. She denies any radiating pain weakness or numbness. She feels well otherwise without any injury. She's been taking ibuprofen which is somewhat helpful.   PMH reviewed. Hypertension History  Substance Use Topics  . Smoking status: Former Smoker    Quit date: 08/02/2011  . Smokeless tobacco: Never Used     Comment: only smokes 1 -2 cigarettes daily.  . Alcohol Use: 1.2 oz/week    2 Glasses of wine per week   ROS as above otherwise neg   Exam:  BP 140/90  Ht 5\' 7"  (1.702 m)  Wt 200 lb (90.719 kg)  BMI 31.32 kg/m2 Gen: Well NAD MSK: Left hip. Normal-appearing Normal rotation range of motion Tender palpation over the greater trochanter Hip abduction strength is 4/5 Pulses capillary refill and sensation are intact distally  Procedure left hip greater trochanteric injection Consent obtained and timeout performed Patient on right side with left hip up. Area palpated identified. Skin was cleaned with alcohol. 3 inch 21-gauge spinal needle used to access the greater trochanteric space.  40 mg of Depo-Medrol 4 mL of Marcaine were injected into the greater trochanteric bursa in a wheel pattern. Patient tolerated procedure well no bleeding.   [facility]: Attending Note: I have reviewed the chart, discussed wit the Sports Medicine Fellow. I agree with assessment and treatment plan as detailed in the Fellow's note.

## 2012-04-25 NOTE — Progress Notes (Signed)
  Subjective:    Patient ID: Gabriela [name], female    DOB: April 30, 1960, 52 y.o.   MRN: [id number]  HPI # Left leg discomfort She stands a lot at work 3 days a week, 12 hours at a time. It started when she started this work, 3 months. She operates machinery.  Characteristics: cramping, starts int he back of her calf and moves up to the crease of her leg  Duration? The pain is there all the time but when she takes something it eases up. It is worse after a day of work. It gets better when she does not work the previous day.  Frequency? All the time.  Progression? Not improving but not getting worse  Alleviated by:    Ibuprofen: 3 tablets at a time taken at night helps her to sleep because she does not cramp; then the pain starts back after she starts moving around   Flexeril: makes her groggy and did not help   She did not bring her work shoes but she wears tennis shoes at work  Review of Systems Denies being sexually active, no abnormal vaginal discharge or irritation Denies N/V/fevers/chills, constipation, diarrhea  Endorses occasion leg weakness Denies bladder/bowel incontinence, leg numbness   Allergies, medication, past medical history reviewed.  Smoking status noted.  2 cigarettes daily      Objective:   Physical Exam GEN: NAD PSYCH: appears frustrated MSK/NEURO:   HIP: pain with internal and external rotation of left hip; ROM RIGHT int 20 deg, ext 30 deg; LEFT int 15 deg, ext 30 deg   No obvious trochanteric bursal pain bilaterally; no obvious tenderness reproducible in leg; no calf tenderness or swelling   Strength: intact hip flexion; 4/5 abduction bilaterally    Negative SLR   No midline back tenderness    No knee swelling tenderness erythema   Reflexes: 2+ popliteal bilaterally   Sensation intact lower extremities  FEET: pes planus GAIT: normal PULSES: 2+ pedal     Assessment & Plan:

## 2012-04-25 NOTE — Patient Instructions (Addendum)
Thank you for coming in today. You have greater trochanteric bursitis.  Do the exercises.  Come back in 6 weeks.   Trochanteric Bursitis You have hip pain due to trochanteric bursitis. Bursitis means that the sack near the outside of the hip is filled with fluid and inflamed. This sack is made up of protective soft tissue. The pain from trochanteric bursitis can be severe and keep you from sleep. It can radiate to the buttocks or down the outside of the thigh to the knee. The pain is almost always worse when rising from the seated or lying position and with walking. Pain can improve after you take a few steps. It happens more often in people with hip joint and lumbar spine problems, such as arthritis or previous surgery. Very rarely the trochanteric bursa can become infected, and antibiotics and/or surgery may be needed. Treatment often includes an injection of local anesthetic mixed with cortisone medicine. This medicine is injected into the area where it is most tender over the hip. Repeat injections may be necessary if the response to treatment is slow. You can apply ice packs over the tender area for 30 minutes every 2 hours for the next few days. Anti-inflammatory and/or narcotic pain medicine may also be helpful. Limit your activity for the next few days if the pain continues. See your caregiver in 5-10 days if you are not greatly improved.  SEEK IMMEDIATE MEDICAL CARE IF:  You develop severe pain, fever, or increased redness.  You have pain that radiates below the knee. EXERCISES STRETCHING EXERCISES - Trochantic Bursitis  These exercises may help you when beginning to rehabilitate your injury. Your symptoms may resolve with or without further involvement from your physician, physical therapist or athletic trainer. While completing these exercises, remember:   Restoring tissue flexibility helps normal motion to return to the joints. This allows healthier, less painful movement and  activity.  An effective stretch should be held for at least 30 seconds.  A stretch should never be painful. You should only feel a gentle lengthening or release in the stretched tissue. STRETCH  Iliotibial Band  On the floor or bed, lie on your side so your injured leg is on top. Bend your knee and grab your ankle.  Slowly bring your knee back so that your thigh is in line with your trunk. Keep your heel at your buttocks and gently arch your back so your head, shoulders and hips line up.  Slowly lower your leg so that your knee approaches the floor/bed until you feel a gentle stretch on the outside of your thigh. If you do not feel a stretch and your knee will not fall farther, place the heel of your opposite foot on top of your knee and pull your thigh down farther.  Hold this stretch for __________ seconds.  Repeat __________ times. Complete this exercise __________ times per day. STRETCH Hamstrings, Supine   Lie on your back. Loop a belt or towel over the ball of your foot as shown.  Straighten your knee and slowly pull on the belt to raise your injured leg. Do not allow the knee to bend. Keep your opposite leg flat on the floor.  Raise the leg until you feel a gentle stretch behind your knee or thigh. Hold this position for __________ seconds.  Repeat __________ times. Complete this stretch __________ times per day. STRETCH - Quadriceps, Prone   Lie on your stomach on a firm surface, such as a bed or padded floor.  Bend your knee and grasp your ankle. If you are unable to reach, your ankle or pant leg, use a belt around your foot to lengthen your reach.  Gently pull your heel toward your buttocks. Your knee should not slide out to the side. You should feel a stretch in the front of your thigh and/or knee.  Hold this position for __________ seconds.  Repeat __________ times. Complete this stretch __________ times per day.   STRETCH - Adductors, Lunge  While standing, spread  your legs  Lean away from your injured leg by bending your opposite knee. You may rest your hands on your thigh for balance.  You should feel a stretch in your inner thigh. Hold for __________ seconds.  Repeat __________ times. Complete this exercise __________ times per day. Document Released: 02/16/2004 Document Revised: 04/02/2011 Document Reviewed: 04/22/2008 [facility] Patient Information 2013 [place], [place].

## 2012-04-25 NOTE — ED Provider Notes (Signed)
History    This chart was scribed for non-physician practitioner working with Olivia Mackie, MD by Frederik Pear, ED Scribe. This patient was seen in room [facility] and the patient's care was started at 2334.   CSN: [id number]  Arrival date & time 04/25/12  2159   First MD Initiated Contact with Patient 04/25/12 2334      Chief Complaint  Patient presents with  . Hip Pain    (Consider location/radiation/quality/duration/timing/severity/associated sxs/prior treatment) The history is provided by the patient and medical records. No language interpreter was used.    Gabriela [name] is a 52 y.o. female with a h/o of hypertension who presents to the Emergency Department complaining of sudden onset, stabbing, 10/10 left hip pain that radiates down her left leg and began tonight while she was laying down in bed and rapidly improved when she stood up. In ED, she rates the current pain as 5/10. She reports that she was seen today at [facility] for ongoing left hip pain for 3x months. She has been diagnosed with trochanteric bursitis and received a glucocorticoid injection. She was told to come to the ER for severe pain and the episode tonight scared her. She denies any nausea, emesis, fever, chills, or saddle paresthesia. She states that she is ambulatory with a limp at baseline and complains of long term tingling in her bilateral feet that she attributes to the 12 hour days that she works standing on her feet at her job; this has not changed any since the injection or onset of pain.  Past Medical History  Diagnosis Date  . Smoker     ONE PACK PER WEEK  . Anemia   . Hypertension   . Benign colon polyp 08/2005    adenomatous  . Dysmenorrhea   . Fibroid   . Chest pain   . Irregular heart beat     Past Surgical History  Procedure Laterality Date  . 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 Umbilical hernia repair      AS A CHILD  . Abdominal hysterectomy  06/2001   Lily Peer, M.D.  . Cardiac catheterization  2003    Dr Allyson Sabal    Family History  Problem Relation Age of Onset  . Heart disease Mother   . Hypertension Mother   . Stroke Father   . Colon cancer Neg Hx   . Stomach cancer Neg Hx     History  Substance Use Topics  . Smoking status: Former Smoker    Quit date: 08/02/2011  . Smokeless tobacco: Never Used     Comment: only smokes 1 -2 cigarettes daily.  . Alcohol Use: 1.2 oz/week    2 Glasses of wine per week    OB History   Grav Para Term Preterm Abortions TAB SAB Ect Mult Living   3 1   2     1       Review of Systems  Constitutional: Negative for fever, diaphoresis, appetite change, fatigue and unexpected weight change.  HENT: Negative for mouth sores and neck stiffness.   Eyes: Negative for visual disturbance.  Respiratory: Negative for cough, chest tightness and wheezing.   Gastrointestinal: Negative for nausea, vomiting, diarrhea and constipation.  Endocrine: Negative for polydipsia, polyphagia and polyuria.  Genitourinary: Negative for dysuria, urgency, frequency and hematuria.  Musculoskeletal: Positive for arthralgias (left hip pain). Negative for back pain.  Skin: Negative for rash.  Allergic/Immunologic: Negative for immunocompromised state.  Neurological: Negative for  syncope and light-headedness.  Hematological: Does not bruise/bleed easily.  Psychiatric/Behavioral: Negative for sleep disturbance. The patient is not nervous/anxious.   All other systems reviewed and are negative.    Allergies  Review of patient's allergies indicates no known allergies.  Home Medications   Current Outpatient Rx  Name  Route  Sig  Dispense  Refill  . Calcium Citrate-Vitamin D 500-400 MG-UNIT CHEW   Oral   Chew 1 tablet by mouth 2 (two) times daily.   60 tablet   12   . cetirizine (ZYRTEC ALLERGY) 10 MG tablet   Oral   Take 10 mg by mouth daily.           . fluticasone (FLONASE) 50 MCG/ACT nasal spray   Nasal    Place 1 spray into the nose daily as needed. For congestion         . hydrochlorothiazide (HYDRODIURIL) 25 MG tablet   Oral   Take 1 tablet (25 mg total) by mouth daily.   90 tablet   1   . Multiple Vitamin (MULTIVITAMIN) tablet   Oral   Take 1 tablet by mouth daily.           . pravastatin (PRAVACHOL) 40 MG tablet   Oral   Take 1 tablet (40 mg total) by mouth daily. For cholesterol.   30 tablet   6     BP 146/87  Pulse 79  Temp(Src) 97.9 F (36.6 C) (Oral)  Resp 16  SpO2 99%  Physical Exam  Nursing note and vitals reviewed. Constitutional: She appears well-developed and well-nourished. No distress.  HENT:  Head: Normocephalic and atraumatic.  Mouth/Throat: Oropharynx is clear and moist. No oropharyngeal exudate.  Eyes: Conjunctivae are normal. No scleral icterus.  Neck: Normal range of motion. Neck supple.  Cardiovascular: Normal rate, regular rhythm, normal heart sounds and intact distal pulses.   No murmur heard. Pulmonary/Chest: Effort normal and breath sounds normal. No respiratory distress. She has no wheezes.  Abdominal: Soft. Bowel sounds are normal. She exhibits no mass. There is no tenderness. There is no rebound and no guarding.  Musculoskeletal: Normal range of motion. She exhibits no edema.       Left hip: She exhibits tenderness. She exhibits normal range of motion, normal strength, no bony tenderness, no swelling, no crepitus, no deformity and no laceration.  Site of injection is non-tender to palpation. No infection or erythema present around injection site. No masses are present. No evidence of cellulitis.   Neurological: She is alert. She has normal reflexes. Gait abnormal.  Speech is clear and goal oriented Moves extremities without ataxia  Skin: Skin is warm and dry. She is not diaphoretic.  Psychiatric: She has a normal mood and affect.    ED Course  Procedures (including critical care time)  DIAGNOSTIC STUDIES: Oxygen Saturation is 99%  on room air, normal by my interpretation.    COORDINATION OF CARE:  23:45- Discussed planned course of treatment with the patient, including Tylenol and repeating vitals, who is agreeable at this time.   Labs Reviewed - No data to display Dg Hip Complete Left  04/25/2012  *RADIOLOGY REPORT*  Clinical Data: Left hip pain for 3 months  LEFT HIP - COMPLETE 2+ VIEW  Comparison: None.  Findings: Hips are located.  No fracture dislocation.  There is subchondral cystic change in the left femoral head.  No evidence of pelvic fracture or sacral fracture.  IMPRESSION:  Subchondral cystic change within the left acetabulum and femoral  head consistent with advanced osteoarthritis.   Original Report Authenticated By: Genevive Bi, M.D.      1. Greater trochanteric bursitis of left hip   2. Left hip pain       MDM  Gabriela [name] presents with acute pain left hip hours after injection of her bursitis. It is resolved and patient ambulates to baseline. She is afebrile, nontoxic, nonseptic appearing. Is evidence of cellulitis at the site of the injection. It is unlikely that there is a fracture as there has been no trauma, injury or fall. Do not believe she needs imaging at this time. She states the pain is under control this time. Patient given Tylenol here in the department to further alleviate her pain. Patient initially tachycardic on arrival however on repeat vitals and exam she is no longer tachycardic.  Encouraged patient to follow-up with orthopedics Monday morning or return here to the department if she develops fever, redness or swelling at the area of the injection her chart for nausea and vomiting.  I have also discussed reasons to return immediately to the ER.  Patient expresses understanding and agrees with plan.  I personally performed the services described in this documentation, which was scribed in my presence. The recorded information has been reviewed and is accurate.        Dahlia Client  Mattilyn Crites, PA-C 04/26/12 [phone]

## 2012-04-25 NOTE — ED Notes (Addendum)
Pt seen today at [facility] for L hip pain x 3 months.  Diagnosed with trochanteric bursitis and received glucocorticoid injection.  Pt reports severe pain at injection site that radiates down L leg when lying.  Denies pain while sitting in triage chair.

## 2012-04-25 NOTE — Telephone Encounter (Signed)
Gabriela [name] wanted to discuss her visit with you today.  Was not pleased with how it went.  Felt that you were abrasive in your discussion regarding the issues she wanted to see you about.

## 2012-04-25 NOTE — Assessment & Plan Note (Signed)
Referred to [facility] who saw her today. I appreciate their seeing this patient. Please see their A/P. They thought she had trochanteric bursitis of left and performed glucocorticoid injection.  Chronic, persistent, left upper leg pain.  Prior to her leaving, we discussed checking hip x-ray to rule-out fracture, avascular necrosis.  She has weak hip musculatures; hip strengthening exercises demonstrated and advised.  Pes planus. She may benefit from orthotics. She has been referred to [facility].

## 2012-04-25 NOTE — Assessment & Plan Note (Signed)
Injection April 2014 Home exercise program ITB stretching Hip Abduction strength Cushioned mat at work Followup 4-6 weeks

## 2012-04-26 NOTE — Telephone Encounter (Signed)
She was upset that I was short with her when she mentioned her toenail problems during our visit yesterday for her left leg pain.  I told her that this was not a problem I could have been able to address at that visit, however, I apologized for not having a better attitude/demeanor when I explained it.   She seemed appeased.   We also discussed her x-ray results showing severe arthritis of her hip.   PLAN:  -See how she does next few weeks following trochanteric bursitis injection.  -She has follow-up with [facility] early May.  -Consider Orthopedics referral for evaluation if her pain still significant.

## 2012-04-26 NOTE — Telephone Encounter (Signed)
LVM  I will call back later.

## 2012-04-26 NOTE — ED Provider Notes (Signed)
Medical screening examination/treatment/procedure(s) were performed by non-physician practitioner and as supervising physician I was immediately available for consultation/collaboration.  Olivia Mackie, MD 04/26/12 [phone]

## 2012-05-22 ENCOUNTER — Ambulatory Visit: Payer: PRIVATE HEALTH INSURANCE

## 2012-05-30 ENCOUNTER — Encounter: Payer: Self-pay | Admitting: Family Medicine

## 2012-05-30 ENCOUNTER — Ambulatory Visit (INDEPENDENT_AMBULATORY_CARE_PROVIDER_SITE_OTHER): Payer: PRIVATE HEALTH INSURANCE | Admitting: Family Medicine

## 2012-05-30 VITALS — BP 143/86 | HR 81 | Ht 67.0 in | Wt 200.0 lb

## 2012-05-30 DIAGNOSIS — M7062 Trochanteric bursitis, left hip: Secondary | ICD-10-CM

## 2012-05-30 DIAGNOSIS — M76899 Other specified enthesopathies of unspecified lower limb, excluding foot: Secondary | ICD-10-CM

## 2012-05-30 NOTE — Progress Notes (Signed)
  Subjective:    Patient ID: Gabriela [name], female    DOB: Jun 10, 1960, 52 y.o.   MRN: [id number]  HPI Followup left hip pain. At last office visit we had given her a greater trochanteric bursa injection in her left hip. She is 52% better. She still has pain at the end of a 12 hour shift and she does. These a week. At that she probably stems more on the left foot than the right.   Review of Systems Denies numbness in the legs. Denies fever.    Objective:   Physical Exam Vital signs are reviewed GENERAL: Well-developed female no acute distress HIPS: Bilaterally full range of motion in internal and external rotation, normal hip flexion. Very mild tenderness to palpation over the left great er trochanteric bursa. Hip abduction weakness bilaterally.       Assessment & Plan:  Greater trochanteric bursitis significantly improved after corticosteroid injection. Today I went over the home exercise program bilateral hip strengthening. I think if she's going to continue to work 10-12 hour shifts she certainly needs to get these muscles little stronger. We will see her back when necessary

## 2012-07-04 ENCOUNTER — Ambulatory Visit: Payer: PRIVATE HEALTH INSURANCE | Admitting: Family Medicine

## 2012-07-10 ENCOUNTER — Telehealth: Payer: Self-pay | Admitting: Family Medicine

## 2012-07-10 NOTE — Telephone Encounter (Signed)
Patient is calling requesting a referral for an ultrasound of her breast to be sent to [facility].

## 2012-07-10 NOTE — Telephone Encounter (Signed)
[name] returning patients call.  Roddy Bellamy, Darlyne Russian, CMA

## 2012-07-11 ENCOUNTER — Telehealth: Payer: Self-pay | Admitting: Family Medicine

## 2012-07-11 DIAGNOSIS — N644 Mastodynia: Secondary | ICD-10-CM

## 2012-07-11 NOTE — Telephone Encounter (Signed)
Order placed for diagnostic L mammogram

## 2012-07-11 NOTE — Telephone Encounter (Signed)
Patient is due for routine mammogram and called to schedule and stated she was experiencing pain in her left breast.  Because of that, an order for a diagnostic mammogram needs to be place.Pt instructed to call [facility] later today to schedule diagnostic mammogram.  Pt verbalized understanding. Order placed through [facility].  Message is an Financial planner for MD.  Radene Ou, CMA

## 2012-07-11 NOTE — Telephone Encounter (Signed)
Pt returned Kristen's call °

## 2012-07-11 NOTE — Addendum Note (Signed)
Addended by: Madolyn Frieze, Marylene Land J on: 07/11/2012 12:11 PM   Modules accepted: Orders

## 2012-07-14 ENCOUNTER — Ambulatory Visit (INDEPENDENT_AMBULATORY_CARE_PROVIDER_SITE_OTHER): Payer: PRIVATE HEALTH INSURANCE | Admitting: Family Medicine

## 2012-07-14 ENCOUNTER — Ambulatory Visit: Payer: PRIVATE HEALTH INSURANCE | Admitting: Family Medicine

## 2012-07-14 VITALS — BP 130/81 | Ht 66.0 in | Wt 197.0 lb

## 2012-07-14 DIAGNOSIS — M25552 Pain in left hip: Secondary | ICD-10-CM

## 2012-07-14 DIAGNOSIS — M25559 Pain in unspecified hip: Secondary | ICD-10-CM

## 2012-07-14 MED ORDER — HYDROCODONE-ACETAMINOPHEN 5-325 MG PO TABS: 1.0000 | ORAL_TABLET | Freq: Two times a day (BID) | ORAL | Status: DC | PRN

## 2012-07-14 NOTE — Patient Instructions (Addendum)
You have been scheduled for a MRI of your hip at [facility] at 07/18/12 at 2pm, please arrive at 1:45 pm at radiology on the 1st floor

## 2012-07-15 NOTE — Progress Notes (Signed)
  Subjective:    Patient ID: Gabriela [name], female    DOB: 1960/09/06, 52 y.o.   MRN: [id number]  HPI  Continued left hip pain. At last office visit we had given her an injection in the greater trochanteric bursa and that really seemed to help. Since then her pain has returned. It aching, present most of the time. Hurts when standing or walking. She is disappointed that the pain has come back. She works 12 hour shifts and finds the last 6 hours per shift the visible secondary to pain.  Review of Systems Denies fever. She's had no numbness in the left leg. She's noted no rash or erythema     Objective:   Physical Exam  Vital signs are reviewed GENERAL: Well-developed female no acute distress HIP: Left. Decreased range of motion external rotation there is pain noted here. Normal flexion and extension. Distally she is neurovascularly intact. IMAGING: Reviewed her x-ray from April.. formal reading:Findings: Hips are located. No fracture dislocation. There is  subchondral cystic change in the left femoral head. No evidence of  pelvic fracture or sacral fracture.  IMPRESSION:  Subchondral cystic change within the left acetabulum and femoral  head consistent with advanced osteoarthritis My interpretation is that the arthritic changes more than just been there description. I'm also a little concerned that this could be early avascular necrosis. Compared with the right hip is much worse.      Assessment & Plan:  #1. Left hip pain. Seems likely have a greater trochanteric bursitis cleared up but now we are dealing with the true joint arthritis. After reviewing her x-rays today and concern for some early avascular necrosis. I think we need to get MRI to further delineate this and we'll set that up. In the interim I will give her Vicodin 5/325 #60. No refill. One by mouth twice a day. She's cautioned about using this at work and with driving

## 2012-07-18 ENCOUNTER — Ambulatory Visit (HOSPITAL_COMMUNITY)
Admission: RE | Admit: 2012-07-18 | Discharge: 2012-07-18 | Disposition: A | Discharge status: Home / Self Care | Payer: PRIVATE HEALTH INSURANCE | Source: Ambulatory Visit | Attending: Family Medicine | Admitting: Family Medicine

## 2012-07-18 DIAGNOSIS — M25559 Pain in unspecified hip: Secondary | ICD-10-CM | POA: Insufficient documentation

## 2012-07-18 DIAGNOSIS — M1610 Unilateral primary osteoarthritis, unspecified hip: Secondary | ICD-10-CM | POA: Insufficient documentation

## 2012-07-18 DIAGNOSIS — M169 Osteoarthritis of hip, unspecified: Secondary | ICD-10-CM | POA: Insufficient documentation

## 2012-07-18 DIAGNOSIS — M25552 Pain in left hip: Secondary | ICD-10-CM

## 2012-07-21 ENCOUNTER — Telehealth: Payer: Self-pay | Admitting: Family Medicine

## 2012-07-21 NOTE — Telephone Encounter (Signed)
Gabriela [name] Please call her and tell her the MRI shows a lot of arthritic change but NO AVASCULAR NECROSIS (which is what I was worried aboout). She has more arthritis in her left hip than right-- when she is 81 -65 she may want to be evaluated for a hip replacement on that side. Until then, I would keep her on  teh vicodin or similar which she can get through her PCP. I do not NEED to see her back but if she wants to discuss further, I would be HAPPY to see her back. THANKS! Denny Levy

## 2012-07-21 NOTE — Telephone Encounter (Signed)
Spoke with pt- gave her MRI results per Dr. Jennette Kettle.  Advised her to follow up with PCP at [facility], but dr. Jennette Kettle will see her back here if she would like.

## 2012-08-14 ENCOUNTER — Ambulatory Visit
Admission: RE | Admit: 2012-08-14 | Discharge: 2012-08-14 | Disposition: A | Discharge status: Home / Self Care | Payer: PRIVATE HEALTH INSURANCE | Source: Ambulatory Visit | Attending: Family Medicine | Admitting: Family Medicine

## 2012-08-14 DIAGNOSIS — N644 Mastodynia: Secondary | ICD-10-CM

## 2012-11-28 ENCOUNTER — Ambulatory Visit (INDEPENDENT_AMBULATORY_CARE_PROVIDER_SITE_OTHER): Payer: Self-pay | Admitting: Family Medicine

## 2012-11-28 ENCOUNTER — Other Ambulatory Visit (HOSPITAL_COMMUNITY)
Admission: RE | Admit: 2012-11-28 | Discharge: 2012-11-28 | Disposition: A | Discharge status: Home / Self Care | Payer: BC Managed Care – PPO | Source: Ambulatory Visit | Attending: Family Medicine | Admitting: Family Medicine

## 2012-11-28 ENCOUNTER — Encounter: Payer: Self-pay | Admitting: Family Medicine

## 2012-11-28 VITALS — BP 130/86 | HR 66 | Temp 98.1°F | Wt 210.0 lb

## 2012-11-28 DIAGNOSIS — R3989 Other symptoms and signs involving the genitourinary system: Secondary | ICD-10-CM

## 2012-11-28 DIAGNOSIS — N390 Urinary tract infection, site not specified: Secondary | ICD-10-CM

## 2012-11-28 DIAGNOSIS — R109 Unspecified abdominal pain: Secondary | ICD-10-CM

## 2012-11-28 DIAGNOSIS — N898 Other specified noninflammatory disorders of vagina: Secondary | ICD-10-CM

## 2012-11-28 DIAGNOSIS — R399 Unspecified symptoms and signs involving the genitourinary system: Secondary | ICD-10-CM

## 2012-11-28 DIAGNOSIS — Z113 Encounter for screening for infections with a predominantly sexual mode of transmission: Secondary | ICD-10-CM | POA: Insufficient documentation

## 2012-11-28 LAB — POCT WET PREP (WET MOUNT)

## 2012-11-28 LAB — POCT URINALYSIS DIPSTICK
Nitrite, UA: NEGATIVE
Urobilinogen, UA: 1
pH, UA: 7.5

## 2012-11-28 MED ORDER — CEPHALEXIN 500 MG PO CAPS: 500.0000 mg | ORAL_CAPSULE | Freq: Three times a day (TID) | ORAL | Status: DC

## 2012-11-28 MED ORDER — FLUCONAZOLE 150 MG PO TABS: 150.0000 mg | ORAL_TABLET | Freq: Once | ORAL | Status: DC

## 2012-11-28 MED ORDER — CETIRIZINE HCL 10 MG PO TABS: 10.0000 mg | ORAL_TABLET | Freq: Every day | ORAL | Status: DC

## 2012-11-28 NOTE — Progress Notes (Signed)
Patient ID: Gabriela [name], female   DOB: [birth year], 52 y.o.   MRN: [id number]  [facility] Aletha Allebach M. Gordon Carlson, MD Phone: [phone]   Subjective: HPI: Patient is a 52 y.o. female presenting to clinic today for same day appointment. Concerns today include dysuria, hematuria  Urinary Tract Infection Patient complains of dysuria, hesitancy and urgency. She has had symptoms for 4 days. Patient also complains of stomach ache. Patient denies back pain, fever and vaginal discharge. Patient does not have a history of recurrent UTI. Patient does not have a history of pyelonephritis.   She has tried increased water, cranberry juice and AZO which did not help much.  Patient states that she was sexually active last weekend for the first time last weekend, used condom.    History Reviewed: Daily smoker. Health Maintenance: Has not had flu shot this year  ROS: Please see HPI above.  Objective: Office vital signs reviewed. BP 130/86  Pulse 66  Temp(Src) 98.1 F (36.7 C) (Oral)  Wt 210 lb (95.255 kg)  Physical Examination:  General: Awake, alert. NAD HEENT: Atraumatic, normocephalic Neck: No masses palpated. No LAD Pulm: CTAB, no wheezes Cardio: RRR, no murmurs appreciated Abdomen:+BS, soft, nontender, nondistended.  GU: External genitalia wnl. No blood in vaginal vault, scant white discharge. No adnexal tenderness Extremities: No edema Neuro: Grossly intact  Assessment: 52 y.o. female with UTI  Plan: See Problem List and After Visit Summary

## 2012-11-28 NOTE — Assessment & Plan Note (Signed)
Patient with WBC and RBC on urine. Given symptoms, will treat at UTI with Keflex and send urine for culture. Also wet prep and GC/ch have been collected. Will need follow up urine to check for clearance of hematuria.

## 2012-11-28 NOTE — Patient Instructions (Signed)
I will call you with your results if they are positive for anything. No news is good news, but you can always call and ask.  I would make a follow up appointment for [place] to discuss your other medical problems.  Take care! Lakia Gritton M. Careli Luzader, M.D.  Urinary Tract Infection A urinary tract infection (UTI) can occur any place along the urinary tract. The tract includes the kidneys, ureters, bladder, and urethra. A type of germ called bacteria often causes a UTI. UTIs are often helped with antibiotic medicine.  HOME CARE   If given, take antibiotics as told by your doctor. Finish them even if you start to feel better.  Drink enough fluids to keep your pee (urine) clear or pale yellow.  Avoid tea, drinks with caffeine, and bubbly (carbonated) drinks.  Pee often. Avoid holding your pee in for a long time.  Pee before and after having sex (intercourse).  Wipe from front to back after you poop (bowel movement) if you are a woman. Use each tissue only once. GET HELP RIGHT AWAY IF:   You have back pain.  You have lower belly (abdominal) pain.  You have chills.  You feel sick to your stomach (nauseous).  You throw up (vomit).  Your burning or discomfort with peeing does not go away.  You have a fever.  Your symptoms are not better in 3 days. MAKE SURE YOU:   Understand these instructions.  Will watch your condition.  Will get help right away if you are not doing well or get worse. Document Released: 06/27/2007 Document Revised: 10/03/2011 Document Reviewed: 08/09/2011 [facility] Patient Information 2014 [place], [place].

## 2012-12-02 ENCOUNTER — Telehealth: Payer: Self-pay | Admitting: Family Medicine

## 2012-12-02 NOTE — Telephone Encounter (Signed)
Left VM for patient to have her call us back regarding her results from last week. If she calls, please let her know:  - She DID have a urine infection and is on appropriate antibiotics for that - Her gonorrhea and chlamydia tests were negative - The microscopy showed she also has a mild bacterial vaginal infection called bacterial vaginosis. She can be treated for this with a pill by mouth twice daily for one week. However, it does not cause any problems if she does not want to treat it. If she wants the other antibiotics, please let me know.  Thanks! Zayvier Caravello M. Maripaz Mullan, M.D.

## 2012-12-02 NOTE — Telephone Encounter (Signed)
Tried calling pt again and left message to call back.  Rylan Bernard,CMA

## 2012-12-04 NOTE — Telephone Encounter (Signed)
Again unable to contact patient.  Will defer to MD for next step. Fleeger, Gabriela [name]

## 2012-12-04 NOTE — Telephone Encounter (Signed)
Thank you for your attempts to contact patient. She has been adequately treated for the UTI. If her other problems bother her, she will hopefully call us back. For now, we will consider this done.  Thanks! Abdallah Hern M. Kiahna Banghart, M.D.

## 2012-12-31 ENCOUNTER — Other Ambulatory Visit: Payer: Self-pay | Admitting: Family Medicine

## 2012-12-31 MED ORDER — HYDROCHLOROTHIAZIDE 25 MG PO TABS: 25.0000 mg | ORAL_TABLET | Freq: Every day | ORAL | Status: DC

## 2013-04-25 ENCOUNTER — Emergency Department (HOSPITAL_COMMUNITY)
Admission: EM | Admit: 2013-04-25 | Discharge: 2013-04-25 | Disposition: A | Discharge status: Home / Self Care | Payer: Self-pay | Attending: Emergency Medicine | Admitting: Emergency Medicine

## 2013-04-25 ENCOUNTER — Emergency Department (HOSPITAL_COMMUNITY): Payer: Self-pay

## 2013-04-25 ENCOUNTER — Encounter (HOSPITAL_COMMUNITY): Payer: Self-pay | Admitting: Emergency Medicine

## 2013-04-25 DIAGNOSIS — F17200 Nicotine dependence, unspecified, uncomplicated: Secondary | ICD-10-CM | POA: Insufficient documentation

## 2013-04-25 DIAGNOSIS — S79919A Unspecified injury of unspecified hip, initial encounter: Secondary | ICD-10-CM | POA: Insufficient documentation

## 2013-04-25 DIAGNOSIS — S79929A Unspecified injury of unspecified thigh, initial encounter: Principal | ICD-10-CM

## 2013-04-25 DIAGNOSIS — Z862 Personal history of diseases of the blood and blood-forming organs and certain disorders involving the immune mechanism: Secondary | ICD-10-CM | POA: Insufficient documentation

## 2013-04-25 DIAGNOSIS — Z8742 Personal history of other diseases of the female genital tract: Secondary | ICD-10-CM | POA: Insufficient documentation

## 2013-04-25 DIAGNOSIS — I10 Essential (primary) hypertension: Secondary | ICD-10-CM | POA: Insufficient documentation

## 2013-04-25 DIAGNOSIS — X500XXA Overexertion from strenuous movement or load, initial encounter: Secondary | ICD-10-CM | POA: Insufficient documentation

## 2013-04-25 DIAGNOSIS — M25552 Pain in left hip: Secondary | ICD-10-CM

## 2013-04-25 DIAGNOSIS — Y9229 Other specified public building as the place of occurrence of the external cause: Secondary | ICD-10-CM | POA: Insufficient documentation

## 2013-04-25 DIAGNOSIS — Z79899 Other long term (current) drug therapy: Secondary | ICD-10-CM | POA: Insufficient documentation

## 2013-04-25 DIAGNOSIS — Z792 Long term (current) use of antibiotics: Secondary | ICD-10-CM | POA: Insufficient documentation

## 2013-04-25 DIAGNOSIS — Y9389 Activity, other specified: Secondary | ICD-10-CM | POA: Insufficient documentation

## 2013-04-25 DIAGNOSIS — Z860100 Personal history of colon polyps, unspecified: Secondary | ICD-10-CM | POA: Insufficient documentation

## 2013-04-25 DIAGNOSIS — W010XXA Fall on same level from slipping, tripping and stumbling without subsequent striking against object, initial encounter: Secondary | ICD-10-CM | POA: Insufficient documentation

## 2013-04-25 MED ORDER — OXYCODONE-ACETAMINOPHEN 5-325 MG PO TABS: 1.0000 | ORAL_TABLET | ORAL | Status: DC | PRN

## 2013-04-25 MED ORDER — OXYCODONE-ACETAMINOPHEN 5-325 MG PO TABS
1.0000 | ORAL_TABLET | Freq: Once | ORAL | Status: AC
  Administered 2013-04-25: 1 via ORAL
  Filled 2013-04-25: qty 1

## 2013-04-25 NOTE — ED Provider Notes (Signed)
CSN: [id number]     Arrival date & time 04/25/13  2038 History  This chart was scribed for non-physician practitioner Charlann Lange, PA-C working with Neta Ehlers, MD by Zettie Pho, ED Scribe. This patient was seen in room TR11C/TR11C and the patient's care was started at 9:29 PM.    Chief Complaint  Patient presents with  . Fall   The history is provided by the patient. No language interpreter was used.   HPI Comments: Gabriela [name] is a 53 y.o. female who presents to the Emergency Department complaining of an injury to the left hip that occurred earlier today, around 3 hours ago, when she reports that she slipped on water and heard a "pop" in the area. She denies falling. Patient is complaining of a constant, gradual onset pain to the left hip secondary to the incident. She reports a history of injury to the left hip "a while back," but denies history of fracture and states her current pain is new for her. She states that the pain is exacerbated with walking/bearing weight. She denies back pain, abdominal pain, or any other injuries secondary to the incident. She denies any allergies to medications. Patient has a history of anemia and HTN.   Past Medical History  Diagnosis Date  . Smoker     ONE PACK PER WEEK  . Anemia   . Hypertension   . Benign colon polyp 08/2005    adenomatous  . Dysmenorrhea   . Fibroid   . Chest pain   . Irregular heart beat    Past Surgical History  Procedure Laterality Date  . 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 Umbilical hernia repair      AS A CHILD  . Abdominal hysterectomy  06/2001    Toney Rakes, M.D.  . Cardiac catheterization  2003    Dr Gwenlyn Found   Family History  Problem Relation Age of Onset  . Heart disease Mother   . Hypertension Mother   . Stroke Father   . Colon cancer Neg Hx   . Stomach cancer Neg Hx    History  Substance Use Topics  . Smoking status: Current Every Day Smoker -- 0.10 packs/day  . Smokeless tobacco:  Never Used     Comment: only smokes 1 -2 cigarettes daily.  . Alcohol Use: 1.2 oz/week    2 Glasses of wine per week   OB History   Grav Para Term Preterm Abortions TAB SAB Ect Mult Living   3 1   2     1      Review of Systems  Gastrointestinal: Negative for abdominal pain.  Musculoskeletal: Positive for arthralgias. Negative for back pain.  All other systems reviewed and are negative.   Allergies  Review of patient's allergies indicates no known allergies.  Home Medications   Current Outpatient Rx  Name  Route  Sig  Dispense  Refill  . Calcium Citrate-Vitamin D 500-400 MG-UNIT CHEW   Oral   Chew 1 tablet by mouth 2 (two) times daily.   60 tablet   12   . cephALEXin (KEFLEX) 500 MG capsule   Oral   Take 1 capsule (500 mg total) by mouth 3 (three) times daily.   21 capsule   0   . cetirizine (ZYRTEC ALLERGY) 10 MG tablet   Oral   Take 1 tablet (10 mg total) by mouth daily.   30 tablet   11   . fluconazole (DIFLUCAN) 150 MG tablet  Oral   Take 1 tablet (150 mg total) by mouth once.   1 tablet   0   . fluticasone (FLONASE) 50 MCG/ACT nasal spray   Nasal   Place 1 spray into the nose daily as needed. For congestion         . hydrochlorothiazide (HYDRODIURIL) 25 MG tablet   Oral   Take 1 tablet (25 mg total) by mouth daily.   90 tablet   1   . HYDROcodone-acetaminophen (NORCO/VICODIN) 5-325 MG per tablet   Oral   Take 1 tablet by mouth 2 (two) times daily as needed for pain.   60 tablet   0   . Multiple Vitamin (MULTIVITAMIN) tablet   Oral   Take 1 tablet by mouth daily.           . pravastatin (PRAVACHOL) 40 MG tablet   Oral   Take 1 tablet (40 mg total) by mouth daily. For cholesterol.   30 tablet   6    Triage Vitals: BP 142/99  Pulse 99  Temp(Src) 98.2 F (36.8 C) (Oral)  Resp 18  Ht 5\' 7"  (1.702 m)  Wt 214 lb (97.07 kg)  BMI 33.51 kg/m2  SpO2 99%  Physical Exam  Nursing note and vitals reviewed. Constitutional: She is oriented  to person, place, and time. She appears well-developed and well-nourished. No distress.  HENT:  Head: Normocephalic and atraumatic.  Eyes: Conjunctivae are normal.  Neck: Normal range of motion. Neck supple.  Pulmonary/Chest: Effort normal. No respiratory distress.  Abdominal: Soft. She exhibits no distension. There is no tenderness. There is no rebound and no guarding.  Musculoskeletal: Normal range of motion. She exhibits tenderness.  No tenderness to the lumbar region. Tenderness to palpation to the lateral hip joint on the left. Mild-moderate swelling of the left ankle without erythema, tenderness, or pain with full range of motion. Full range of motion of bilateral lower extremities. Distal pulses intact.   Neurological: She is alert and oriented to person, place, and time.  Normal strength against resistance of bilateral lower extremities. Distal sensation intact.   Skin: Skin is warm and dry.  Psychiatric: She has a normal mood and affect. Her behavior is normal.    ED Course  Procedures (including critical care time)  DIAGNOSTIC STUDIES: Oxygen Saturation is 99% on room air, normal by my interpretation.    COORDINATION OF CARE: 9:33 PM- Will order an x-ray of the left hip. Will order Percocet to manage symptoms. Discussed treatment plan with patient at bedside and patient verbalized agreement.     Labs Review Labs Reviewed - No data to display  Imaging Review Dg Hip Complete Left  04/25/2013   CLINICAL DATA:  Nearly fell, with persistent left hip pain and left leg tingling.  EXAM: LEFT HIP - COMPLETE 2+ VIEW  COMPARISON:  Left hip radiographs performed 04/25/2012, and left hip MRI performed 07/18/2012  FINDINGS: There is no evidence of fracture or dislocation. Both femoral heads are seated normally within their respective acetabula. The proximal left femur appears intact. The sclerotic pattern at the left femoral head is stable from prior studies, and on correlation with prior  MRI, appears to be largely artifactual in nature. The sacroiliac joints are unremarkable in appearance.  The visualized bowel gas pattern is grossly unremarkable in appearance. Scattered phleboliths are noted within the pelvis.  IMPRESSION: No evidence of fracture or dislocation.   Electronically Signed   By: Garald Balding M.D.   On: 04/25/2013 22:26  EKG Interpretation None      MDM   Final diagnoses:  None    1. Left hip pain  Records reviewed. She has history of left hip arthritis and bursitis. She did not fully fall, and likely strained hip while preventing fall. Will treat supportively, refer to orthopedics.   I personally performed the services described in this documentation, which was scribed in my presence. The recorded information has been reviewed and is accurate.      Dewaine Oats, PA-C 04/25/13 2316

## 2013-04-25 NOTE — ED Notes (Signed)
Patient states she was at [facility] and slipped in a puddle of water.  Caught herself from falling but felt like something popped in her left outer ankle.  Ankle swollen, + pulses good bilaterally.  Patient was wearing flip flops when this happened.

## 2013-04-25 NOTE — ED Notes (Signed)
The pt is c/o lt hip pain after she slipped and fell in some water. nonother pain

## 2013-04-25 NOTE — Discharge Instructions (Signed)
Arthralgia Arthralgia is joint pain. A joint is a place where two bones meet. Joint pain can happen for many reasons. The joint can be bruised, stiff, infected, or weak from aging. Pain usually goes away after resting and taking medicine for soreness.  HOME CARE  Rest the joint as told by your doctor.  Keep the sore joint raised (elevated) for the first 24 hours.  Put ice on the joint area.  Put ice in a plastic bag.  Place a towel between your skin and the bag.  Leave the ice on for 15-20 minutes, 03-04 times a day.  Wear your splint, casting, elastic bandage, or sling as told by your doctor.  Only take medicine as told by your doctor. Do not take aspirin.  Use crutches as told by your doctor. Do not put weight on the joint until told to by your doctor. GET HELP RIGHT AWAY IF:   You have bruising, puffiness (swelling), or more pain.  Your fingers or toes turn blue or start to lose feeling (numb).  Your medicine does not lessen the pain.  Your pain becomes severe.  You have a temperature by mouth above 102 F (38.9 C), not controlled by medicine.  You cannot move or use the joint. MAKE SURE YOU:   Understand these instructions.  Will watch your condition.  Will get help right away if you are not doing well or get worse. Document Released: 12/27/2008 Document Revised: 04/02/2011 Document Reviewed: 12/27/2008 [facility] Patient Information 2014 [place], [place]. Cryotherapy Cryotherapy means treatment with cold. Ice or gel packs can be used to reduce both pain and swelling. Ice is the most helpful within the first 24 to 48 hours after an injury or flareup from overusing a muscle or joint. Sprains, strains, spasms, burning pain, shooting pain, and aches can all be eased with ice. Ice can also be used when recovering from surgery. Ice is effective, has very few side effects, and is safe for most people to use. PRECAUTIONS  Ice is not a safe treatment option for people  with:  Raynaud's phenomenon. This is a condition affecting small blood vessels in the extremities. Exposure to cold may cause your problems to return.  Cold hypersensitivity. There are many forms of cold hypersensitivity, including:  Cold urticaria. Red, itchy hives appear on the skin when the tissues begin to warm after being iced.  Cold erythema. This is a red, itchy rash caused by exposure to cold.  Cold hemoglobinuria. Red blood cells break down when the tissues begin to warm after being iced. The hemoglobin that carry oxygen are passed into the urine because they cannot combine with blood proteins fast enough.  Numbness or altered sensitivity in the area being iced. If you have any of the following conditions, do not use ice until you have discussed cryotherapy with your caregiver:  Heart conditions, such as arrhythmia, angina, or chronic heart disease.  High blood pressure.  Healing wounds or open skin in the area being iced.  Current infections.  Rheumatoid arthritis.  Poor circulation.  Diabetes. Ice slows the blood flow in the region it is applied. This is beneficial when trying to stop inflamed tissues from spreading irritating chemicals to surrounding tissues. However, if you expose your skin to cold temperatures for too long or without the proper protection, you can damage your skin or nerves. Watch for signs of skin damage due to cold. HOME CARE INSTRUCTIONS Follow these tips to use ice and cold packs safely.  Place a  dry or damp towel between the ice and skin. A damp towel will cool the skin more quickly, so you may need to shorten the time that the ice is used.  For a more rapid response, add gentle compression to the ice.  Ice for no more than 10 to 20 minutes at a time. The bonier the area you are icing, the less time it will take to get the benefits of ice.  Check your skin after 5 minutes to make sure there are no signs of a poor response to cold or skin  damage.  Rest 20 minutes or more in between uses.  Once your skin is numb, you can end your treatment. You can test numbness by very lightly touching your skin. The touch should be so light that you do not see the skin dimple from the pressure of your fingertip. When using ice, most people will feel these normal sensations in this order: cold, burning, aching, and numbness.  Do not use ice on someone who cannot communicate their responses to pain, such as small children or people with dementia. HOW TO MAKE AN ICE PACK Ice packs are the most common way to use ice therapy. Other methods include ice massage, ice baths, and cryo-sprays. Muscle creams that cause a cold, tingly feeling do not offer the same benefits that ice offers and should not be used as a substitute unless recommended by your caregiver. To make an ice pack, do one of the following:  Place crushed ice or a bag of frozen vegetables in a sealable plastic bag. Squeeze out the excess air. Place this bag inside another plastic bag. Slide the bag into a pillowcase or place a damp towel between your skin and the bag.  Mix 3 parts water with 1 part rubbing alcohol. Freeze the mixture in a sealable plastic bag. When you remove the mixture from the freezer, it will be slushy. Squeeze out the excess air. Place this bag inside another plastic bag. Slide the bag into a pillowcase or place a damp towel between your skin and the bag. SEEK MEDICAL CARE IF:  You develop white spots on your skin. This may give the skin a blotchy (mottled) appearance.  Your skin turns blue or pale.  Your skin becomes waxy or hard.  Your swelling gets worse. MAKE SURE YOU:   Understand these instructions.  Will watch your condition.  Will get help right away if you are not doing well or get worse. Document Released: 09/04/2010 Document Revised: 04/02/2011 Document Reviewed: 09/04/2010 [facility] Patient Information 2014 [place], [place].

## 2013-04-26 NOTE — ED Provider Notes (Signed)
Medical screening examination/treatment/procedure(s) were performed by non-physician practitioner and as supervising physician I was immediately available for consultation/collaboration.   Neta Ehlers, MD 04/26/13 1047

## 2013-04-30 ENCOUNTER — Ambulatory Visit: Payer: Self-pay | Admitting: Sports Medicine

## 2013-06-08 ENCOUNTER — Ambulatory Visit (INDEPENDENT_AMBULATORY_CARE_PROVIDER_SITE_OTHER): Payer: 59 | Admitting: Family Medicine

## 2013-06-08 ENCOUNTER — Encounter: Payer: Self-pay | Admitting: Family Medicine

## 2013-06-08 VITALS — BP 125/87 | Ht 67.0 in | Wt 200.0 lb

## 2013-06-08 DIAGNOSIS — M7062 Trochanteric bursitis, left hip: Secondary | ICD-10-CM

## 2013-06-08 DIAGNOSIS — M76899 Other specified enthesopathies of unspecified lower limb, excluding foot: Secondary | ICD-10-CM

## 2013-06-08 DIAGNOSIS — S93409A Sprain of unspecified ligament of unspecified ankle, initial encounter: Secondary | ICD-10-CM | POA: Insufficient documentation

## 2013-06-08 NOTE — Assessment & Plan Note (Signed)
Grade 1 ankle sprain with some residual soft tissue swelling in sense of weakness. No effusion noted in the ankle joint today on ultrasound so I don't think there is any intra-articular pathology going on. We'll have her do home exercise program for ankle stability and strengthening and see her back 4-6 weeks. Also recommend over-the-counter compression brace for day she's going to be on her feet more than -45 hours to prevent swelling. I would continue icing.

## 2013-06-08 NOTE — Assessment & Plan Note (Signed)
Left hip pain today after a slip while she was at [employer]. I think she does strain some of the hip muscles. They'll think this is a resurgence of her greater trochanteric bursitis. Will put her on a home exercise program see her back 4-6 weeks. She's not improving will reconsider greater trochanteric bursitis.

## 2013-06-08 NOTE — Progress Notes (Signed)
Patient ID: Gabriela [name], female   DOB: Sep 02, 1960, 53 y.o.   MRN: [id number]  Gabriela [name] - 53 y.o. female MRN [id number]  Date of birth: 12/03/1960    SUBJECTIVE:     Injury April 25, 2013. Patient slipped while in a [employer]. Did not fall to the ground. Felt a pop sensation in her left hip and has immediate pain. Was seen at the emergency room. Also noted that she had some ankle pain and ankle swelling at the time. Is here today for followup. The hip pain has gotten some better. He only hurts with certain movements now and is tender she pushes on it. Ankle is also better but still swollen and occasionally feels very weak.    ROS:     No fever, sweats, chills, unusual weight change.  PERTINENT  PMH / PSH FH / / SH:  Past Medical, Surgical, Social, and Family History Reviewed & Updated per EMR.  Pertinent Historical Findings include:  Prior history of left hip pain with MRI on 07/14/2012 showing hip joint degenerative changes left greater than right.  OBJECTIVE: BP 125/87  Ht 5\' 7"  (1.702 m)  Wt 200 lb (90.719 kg)  BMI 31.32 kg/m2  Physical Exam:  Vital signs are reviewed. GENERAL: Well-developed female no acute distress HIP: Left. Tender to palpation in the muscles bilaterally but not specifically over the greater trochanteric bursa area. She has some pain with resisted abduction but has full range of motion internal/external rotation without any pain. Normal hip flexor strength. ANKLE: Left. Mild soft tissue swelling laterally. No tenderness along the joint lines. She has intact plantar and dorsiflexion. Her anterior drawer is normal but somewhat painful. ; ULTRASOUND: Ankle. No significant ankle effusion.  ASSESSMENT & PLAN:  See problem based charting & AVS for pt instructions.

## 2013-06-18 ENCOUNTER — Ambulatory Visit (INDEPENDENT_AMBULATORY_CARE_PROVIDER_SITE_OTHER): Payer: 59 | Admitting: Women's Health

## 2013-06-18 ENCOUNTER — Encounter: Payer: Self-pay | Admitting: Women's Health

## 2013-06-18 VITALS — BP 128/86 | Ht 67.0 in | Wt 211.0 lb

## 2013-06-18 DIAGNOSIS — E079 Disorder of thyroid, unspecified: Secondary | ICD-10-CM

## 2013-06-18 DIAGNOSIS — Z01419 Encounter for gynecological examination (general) (routine) without abnormal findings: Secondary | ICD-10-CM

## 2013-06-18 DIAGNOSIS — Z13220 Encounter for screening for lipoid disorders: Secondary | ICD-10-CM

## 2013-06-18 DIAGNOSIS — Z79890 Hormone replacement therapy (postmenopausal): Secondary | ICD-10-CM

## 2013-06-18 LAB — COMPREHENSIVE METABOLIC PANEL
ALT: 26 U/L (ref 0–35)
AST: 20 U/L (ref 0–37)
Albumin: 4.5 g/dL (ref 3.5–5.2)
Alkaline Phosphatase: 97 U/L (ref 39–117)
BILIRUBIN TOTAL: 0.6 mg/dL (ref 0.2–1.2)
BUN: 10 mg/dL (ref 6–23)
CO2: 29 meq/L (ref 19–32)
Calcium: 9.9 mg/dL (ref 8.4–10.5)
Chloride: 102 mEq/L (ref 96–112)
Creat: 0.78 mg/dL (ref 0.50–1.10)
Glucose, Bld: 104 mg/dL — ABNORMAL HIGH (ref 70–99)
Potassium: 4 mEq/L (ref 3.5–5.3)
SODIUM: 140 meq/L (ref 135–145)
TOTAL PROTEIN: 7.2 g/dL (ref 6.0–8.3)

## 2013-06-18 LAB — LIPID PANEL
Cholesterol: 212 mg/dL — ABNORMAL HIGH (ref 0–200)
HDL: 68 mg/dL (ref >39)
LDL Cholesterol: 131 mg/dL — ABNORMAL HIGH (ref 0–99)
Total CHOL/HDL Ratio: 3.1 Ratio
Triglycerides: 66 mg/dL (ref <150)
VLDL: 13 mg/dL (ref 0–40)

## 2013-06-18 LAB — CBC WITH DIFFERENTIAL/PLATELET
Basophils Absolute: 0 10*3/uL (ref 0.0–0.1)
Basophils Relative: 0 % (ref 0–1)
EOS ABS: 0.1 10*3/uL (ref 0.0–0.7)
Eosinophils Relative: 2 % (ref 0–5)
HCT: 38.3 % (ref 36.0–46.0)
Hemoglobin: 13.3 g/dL (ref 12.0–15.0)
Lymphocytes Relative: 43 % (ref 12–46)
Lymphs Abs: 2.5 10*3/uL (ref 0.7–4.0)
MCH: 27.2 pg (ref 26.0–34.0)
MCHC: 34.7 g/dL (ref 30.0–36.0)
MCV: 78.3 fL (ref 78.0–100.0)
Monocytes Absolute: 0.4 10*3/uL (ref 0.1–1.0)
Monocytes Relative: 7 % (ref 3–12)
NEUTROS PCT: 48 % (ref 43–77)
Neutro Abs: 2.8 10*3/uL (ref 1.7–7.7)
PLATELETS: 384 10*3/uL (ref 150–400)
RBC: 4.89 MIL/uL (ref 3.87–5.11)
RDW: 13.9 % (ref 11.5–15.5)
WBC: 5.9 10*3/uL (ref 4.0–10.5)

## 2013-06-18 MED ORDER — ESTRADIOL 0.1 MG/24HR TD PTTW: 1.0000 | MEDICATED_PATCH | TRANSDERMAL | Status: DC

## 2013-06-18 NOTE — Progress Notes (Signed)
Gabriela [name] 06/29/1960 [id number]    History:    Presents for annual exam.  2003 abdominal hysterectomy for fibroids and dysmenorrhea. Smoker, less than 5 cigarettes daily. Numerous hot flushes causing poor sleep. Hypertension and hypercholesterolemia primary care. Normal Pap and mammogram history. 2007 and 2013 colonoscopy benign tubular adenoma colon polyps.  Past medical history, past surgical history, family history and social history were all reviewed and documented in the EPIC chart. Was laid off from [employer], lost her home, is now working for [employer]. 1 son doing well.  ROS:  A  12 point ROS was performed and pertinent positives and negatives are included.  Exam:  Filed Vitals:   06/18/13 1144  BP: 128/86    General appearance:  Normal, perspiring Thyroid:  Symmetrical, normal in size, without palpable masses or nodularity. Respiratory  Auscultation:  Clear without wheezing or rhonchi Cardiovascular  Auscultation:  Regular rate, without rubs, murmurs or gallops  Edema/varicosities:  Not grossly evident Abdominal  Soft,nontender, without masses, guarding or rebound.  Liver/spleen:  No organomegaly noted  Hernia:  None appreciated  Skin  Inspection:  Grossly normal   Breasts: Examined lying and sitting.     Right: Without masses, retractions, discharge or axillary adenopathy.     Left: Without masses, retractions, discharge or axillary adenopathy. Gentitourinary   Inguinal/mons:  Normal without inguinal adenopathy  External genitalia:  Normal  BUS/Urethra/Skene's glands:  Normal  Vagina:  Normal  Cervix:  Absent  Uterus:  Absent  Adnexa/parametria:     Rt: Without masses or tenderness.   Lt: Without masses or tenderness.  Anus and perineum: Normal  Digital rectal exam: Normal sphincter tone without palpated masses or tenderness  Assessment/Plan:  53 y.o. SBF G1P1 for annual exam with numerous hot flashes causing poor rest.  2003 TAH for  fibroids Smoker Hypertension and hypercholesterolemia-primary care manages meds Menopausal symptoms  Plan: Options reviewed, will try Minivelle 0.1 patch twice weekly prescription, risk for blood clots, strokes, breast cancer reviewed. Coupon, proper use given and reviewed. Instructed to call if no relief of symptoms. SBE's, continue annual mammogram, calcium rich diet, vitamin D 2000 daily encouraged. DEXA, will schedule. Labs will have drawn here and followup with primary care. CBC, comprehensive metabolic panel, lipid panel, TSH, UA. Aware of need to decrease calories for weight loss.  Note: This dictation was prepared with Dragon/digital dictation.  Any transcriptional errors that result are unintentional. Huel Cote [facility], 1:12 PM 06/18/2013

## 2013-06-18 NOTE — Patient Instructions (Signed)
Health Recommendations for Postmenopausal Women Respected and ongoing research has looked at the most common causes of death, disability, and poor quality of life in postmenopausal women. The causes include heart disease, diseases of blood vessels, diabetes, depression, cancer, and bone loss (osteoporosis). Many things can be done to help lower the chances of developing these and other common problems: CARDIOVASCULAR DISEASE Heart Disease: A heart attack is a medical emergency. Know the signs and symptoms of a heart attack. Below are things women can do to reduce their risk for heart disease.   Do not smoke. If you smoke, quit.  Aim for a healthy weight. Being overweight causes many preventable deaths. Eat a healthy and balanced diet and drink an adequate amount of liquids.  Get moving. Make a commitment to be more physically active. Aim for 30 minutes of activity on most, if not all days of the week.  Eat for heart health. Choose a diet that is low in saturated fat and cholesterol and eliminate trans fat. Include whole grains, vegetables, and fruits. Read and understand the labels on food containers before buying.  Know your numbers. Ask your caregiver to check your blood pressure, cholesterol (total, HDL, LDL, triglycerides) and blood glucose. Work with your caregiver on improving your entire clinical picture.  High blood pressure. Limit or stop your table salt intake (try salt substitute and food seasonings). Avoid salty foods and drinks. Read labels on food containers before buying. Eating well and exercising can help control high blood pressure. STROKE  Stroke is a medical emergency. Stroke may be the result of a blood clot in a blood vessel in the brain or by a brain hemorrhage (bleeding). Know the signs and symptoms of a stroke. To lower the risk of developing a stroke:  Avoid fatty foods.  Quit smoking.  Control your diabetes, blood pressure, and irregular heart rate. THROMBOPHLEBITIS  (BLOOD CLOT) OF THE LEG  Becoming overweight and leading a stationary lifestyle may also contribute to developing blood clots. Controlling your diet and exercising will help lower the risk of developing blood clots. CANCER SCREENING  Breast Cancer: Take steps to reduce your risk of breast cancer.  You should practice "breast self-awareness." This means understanding the normal appearance and feel of your breasts and should include breast self-examination. Any changes detected, no matter how small, should be reported to your caregiver.  After age 40, you should have a clinical breast exam (CBE) every year.  Starting at age 40, you should consider having a mammogram (breast X-ray) every year.  If you have a family history of breast cancer, talk to your caregiver about genetic screening.  If you are at high risk for breast cancer, talk to your caregiver about having an MRI and a mammogram every year.  Intestinal or Stomach Cancer: Tests to consider are a rectal exam, fecal occult blood, sigmoidoscopy, and colonoscopy. Women who are high risk may need to be screened at an earlier age and more often.  Cervical Cancer:  Beginning at age 30, you should have a Pap test every 3 years as long as the past 3 Pap tests have been normal.  If you have had past treatment for cervical cancer or a condition that could lead to cancer, you need Pap tests and screening for cancer for at least 20 years after your treatment.  If you had a hysterectomy for a problem that was not cancer or a condition that could lead to cancer, then you no longer need Pap tests.    If you are between ages 65 and 70, and you have had normal Pap tests going back 10 years, you no longer need Pap tests.  If Pap tests have been discontinued, risk factors (such as a new sexual partner) need to be reassessed to determine if screening should be resumed.  Some medical problems can increase the chance of getting cervical cancer. In these  cases, your caregiver may recommend more frequent screening and Pap tests.  Uterine Cancer: If you have vaginal bleeding after reaching menopause, you should notify your caregiver.  Ovarian cancer: Other than yearly pelvic exams, there are no reliable tests available to screen for ovarian cancer at this time except for yearly pelvic exams.  Lung Cancer: Yearly chest X-rays can detect lung cancer and should be done on high risk women, such as cigarette smokers and women with chronic lung disease (emphysema).  Skin Cancer: A complete body skin exam should be done at your yearly examination. Avoid overexposure to the sun and ultraviolet light lamps. Use a strong sun block cream when in the sun. All of these things are important in lowering the risk of skin cancer. MENOPAUSE Menopause Symptoms: Hormone therapy products are effective for treating symptoms associated with menopause:  Moderate to severe hot flashes.  Night sweats.  Mood swings.  Headaches.  Tiredness.  Loss of sex drive.  Insomnia.  Other symptoms. Hormone replacement carries certain risks, especially in older women. Women who use or are thinking about using estrogen or estrogen with progestin treatments should discuss that with their caregiver. Your caregiver will help you understand the benefits and risks. The ideal dose of hormone replacement therapy is not known. The Food and Drug Administration (FDA) has concluded that hormone therapy should be used only at the lowest doses and for the shortest amount of time to reach treatment goals.  OSTEOPOROSIS Protecting Against Bone Loss and Preventing Fracture: If you use hormone therapy for prevention of bone loss (osteoporosis), the risks for bone loss must outweigh the risk of the therapy. Ask your caregiver about other medications known to be safe and effective for preventing bone loss and fractures. To guard against bone loss or fractures, the following is recommended:  If  you are less than age 50, take 1000 mg of calcium and at least 600 mg of Vitamin D per day.  If you are greater than age 50 but less than age 70, take 1200 mg of calcium and at least 600 mg of Vitamin D per day.  If you are greater than age 70, take 1200 mg of calcium and at least 800 mg of Vitamin D per day. Smoking and excessive alcohol intake increases the risk of osteoporosis. Eat foods rich in calcium and vitamin D and do weight bearing exercises several times a week as your caregiver suggests. DIABETES Diabetes Melitus: If you have Type I or Type 2 diabetes, you should keep your blood sugar under control with diet, exercise and recommended medication. Avoid too many sweets, starchy and fatty foods. Being overweight can make control more difficult. COGNITION AND MEMORY Cognition and Memory: Menopausal hormone therapy is not recommended for the prevention of cognitive disorders such as Alzheimer's disease or memory loss.  DEPRESSION  Depression may occur at any age, but is common in elderly women. The reasons may be because of physical, medical, social (loneliness), or financial problems and needs. If you are experiencing depression because of medical problems and control of symptoms, talk to your caregiver about this. Physical activity and   exercise may help with mood and sleep. Community and volunteer involvement may help your sense of value and worth. If you have depression and you feel that the problem is getting worse or becoming severe, talk to your caregiver about treatment options that are best for you. ACCIDENTS  Accidents are common and can be serious in the elderly woman. Prepare your house to prevent accidents. Eliminate throw rugs, place hand bars in the bath, shower and toilet areas. Avoid wearing high heeled shoes or walking on wet, snowy, and icy areas. Limit or stop driving if you have vision or hearing problems, or you feel you are unsteady with you movements and  reflexes. HEPATITIS C Hepatitis C is a type of viral infection affecting the liver. It is spread mainly through contact with blood from an infected person. It can be treated, but if left untreated, it can lead to severe liver damage over years. Many people who are infected do not know that the virus is in their blood. If you are a "baby-boomer", it is recommended that you have one screening test for Hepatitis C. IMMUNIZATIONS  Several immunizations are important to consider having during your senior years, including:   Tetanus, diptheria, and pertussis booster shot.  Influenza every year before the flu season begins.  Pneumonia vaccine.  Shingles vaccine.  Others as indicated based on your specific needs. Talk to your caregiver about these. Document Released: 03/02/2005 Document Revised: 12/26/2011 Document Reviewed: 10/27/2007 [employer] Patient Information 2014 [employer].  

## 2013-06-19 LAB — URINALYSIS W MICROSCOPIC + REFLEX CULTURE
Bacteria, UA: NONE SEEN
Bilirubin Urine: NEGATIVE
CRYSTALS: NONE SEEN
Casts: NONE SEEN
Glucose, UA: NEGATIVE mg/dL
Hgb urine dipstick: NEGATIVE
Ketones, ur: NEGATIVE mg/dL
Leukocytes, UA: NEGATIVE
NITRITE: NEGATIVE
Protein, ur: NEGATIVE mg/dL
SPECIFIC GRAVITY, URINE: 1.021 (ref 1.005–1.030)
Urobilinogen, UA: 0.2 mg/dL (ref 0.0–1.0)
pH: 7 (ref 5.0–8.0)

## 2013-06-19 LAB — TSH: TSH: 2.727 u[IU]/mL (ref 0.350–4.500)

## 2013-07-02 ENCOUNTER — Other Ambulatory Visit: Payer: Self-pay | Admitting: Family Medicine

## 2013-07-17 ENCOUNTER — Ambulatory Visit (INDEPENDENT_AMBULATORY_CARE_PROVIDER_SITE_OTHER): Payer: 59 | Admitting: Family Medicine

## 2013-07-17 ENCOUNTER — Encounter: Payer: Self-pay | Admitting: Family Medicine

## 2013-07-17 VITALS — BP 146/87 | Ht 67.0 in | Wt 200.0 lb

## 2013-07-17 DIAGNOSIS — Z5189 Encounter for other specified aftercare: Secondary | ICD-10-CM

## 2013-07-17 DIAGNOSIS — S93409A Sprain of unspecified ligament of unspecified ankle, initial encounter: Secondary | ICD-10-CM

## 2013-07-17 DIAGNOSIS — S7000XA Contusion of unspecified hip, initial encounter: Secondary | ICD-10-CM

## 2013-07-17 DIAGNOSIS — S7002XD Contusion of left hip, subsequent encounter: Secondary | ICD-10-CM

## 2013-07-17 NOTE — Progress Notes (Signed)
   Subjective:    Patient ID: Gabriela [name], female    DOB: November 21, 1960, 53 y.o.   MRN: [id number]  HPI Followup left hip contusion and left ankle sprain. Per previous notes: Injury April 25, 2013. Patient slipped while in a [employer]. Did not fall to the ground. Felt a pop sensation in her left hip and has immediate pain. Was seen at the emergency room. Also noted that she had some ankle pain and ankle swelling at the time Not really having any more problems with the left hip. Ankles better, is walking still has some mild pain in that swelling much every day. States did not have this prior to her injury.    Review of Systems No falls or legs giving way. No fever, sweats, chills. Mild ankle swelling as above otherwise no lower extremity edema or swelling.    Objective:   Physical Exam Vital signs are reviewed GENERAL: Well-developed female no acute distress in HIPS: Left. Nontender to palpation. Internal/external rotation is normal. Hip flexor strength is normal. ANKLE: Left. Mild soft tissue swelling over the ATF area. Very diffusely very mildly tender. Normal anterior drawer. No ecchymoses. Normal strength and range of motion and inversion eversion, dorsiflexion and plantar flexion. Sensation is intact to soft touch.       Assessment & Plan:

## 2013-07-17 NOTE — Assessment & Plan Note (Signed)
She still having some mild swelling. I don't think this is going to be a huge problem for her. On days when she stands a lot she could wear a small compression stocking or Ace wrap that she get over-the-counter at a drugstore. She can followup when necessary. I would recommend she continues to do her rehabilitation exercises until she feels like she has normal strength and stability. Her exam today is otherwise normal

## 2013-07-20 ENCOUNTER — Emergency Department (INDEPENDENT_AMBULATORY_CARE_PROVIDER_SITE_OTHER)
Admission: EM | Admit: 2013-07-20 | Discharge: 2013-07-20 | Disposition: A | Discharge status: Home / Self Care | Payer: 59 | Source: Home / Self Care

## 2013-07-20 ENCOUNTER — Encounter (HOSPITAL_COMMUNITY): Payer: Self-pay | Admitting: Emergency Medicine

## 2013-07-20 DIAGNOSIS — M25569 Pain in unspecified knee: Secondary | ICD-10-CM

## 2013-07-20 DIAGNOSIS — M25562 Pain in left knee: Secondary | ICD-10-CM

## 2013-07-20 MED ORDER — MELOXICAM 7.5 MG PO TABS: 7.5000 mg | ORAL_TABLET | Freq: Every day | ORAL | Status: DC

## 2013-07-20 NOTE — ED Provider Notes (Signed)
Medical screening examination/treatment/procedure(s) were performed by resident physician or non-physician practitioner and as supervising physician I was immediately available for consultation/collaboration.   Pauline Good MD.   Billy Fischer, MD 07/20/13 2121

## 2013-07-20 NOTE — Discharge Instructions (Signed)
Follow up with orthopedist tomorrow.  If there is any change or concern tonight such as swelling of left lower leg, loss of sensation, or increased pain go to Emergency Department to be evaluated immediately.  Cryotherapy Cryotherapy means treatment with cold. Ice or gel packs can be used to reduce both pain and swelling. Ice is the most helpful within the first 24 to 48 hours after an injury or flareup from overusing a muscle or joint. Sprains, strains, spasms, burning pain, shooting pain, and aches can all be eased with ice. Ice can also be used when recovering from surgery. Ice is effective, has very few side effects, and is safe for most people to use. PRECAUTIONS  Ice is not a safe treatment option for people with:  Raynaud's phenomenon. This is a condition affecting small blood vessels in the extremities. Exposure to cold may cause your problems to return.  Cold hypersensitivity. There are many forms of cold hypersensitivity, including:  Cold urticaria. Red, itchy hives appear on the skin when the tissues begin to warm after being iced.  Cold erythema. This is a red, itchy rash caused by exposure to cold.  Cold hemoglobinuria. Red blood cells break down when the tissues begin to warm after being iced. The hemoglobin that carry oxygen are passed into the urine because they cannot combine with blood proteins fast enough.  Numbness or altered sensitivity in the area being iced. If you have any of the following conditions, do not use ice until you have discussed cryotherapy with your caregiver:  Heart conditions, such as arrhythmia, angina, or chronic heart disease.  High blood pressure.  Healing wounds or open skin in the area being iced.  Current infections.  Rheumatoid arthritis.  Poor circulation.  Diabetes. Ice slows the blood flow in the region it is applied. This is beneficial when trying to stop inflamed tissues from spreading irritating chemicals to surrounding tissues.  However, if you expose your skin to cold temperatures for too long or without the proper protection, you can damage your skin or nerves. Watch for signs of skin damage due to cold. HOME CARE INSTRUCTIONS Follow these tips to use ice and cold packs safely.  Place a dry or damp towel between the ice and skin. A damp towel will cool the skin more quickly, so you may need to shorten the time that the ice is used.  For a more rapid response, add gentle compression to the ice.  Ice for no more than 10 to 20 minutes at a time. The bonier the area you are icing, the less time it will take to get the benefits of ice.  Check your skin after 5 minutes to make sure there are no signs of a poor response to cold or skin damage.  Rest 20 minutes or more in between uses.  Once your skin is numb, you can end your treatment. You can test numbness by very lightly touching your skin. The touch should be so light that you do not see the skin dimple from the pressure of your fingertip. When using ice, most people will feel these normal sensations in this order: cold, burning, aching, and numbness.  Do not use ice on someone who cannot communicate their responses to pain, such as small children or people with dementia. HOW TO MAKE AN ICE PACK Ice packs are the most common way to use ice therapy. Other methods include ice massage, ice baths, and cryo-sprays. Muscle creams that cause a cold, tingly feeling do  not offer the same benefits that ice offers and should not be used as a substitute unless recommended by your caregiver. To make an ice pack, do one of the following:  Place crushed ice or a bag of frozen vegetables in a sealable plastic bag. Squeeze out the excess air. Place this bag inside another plastic bag. Slide the bag into a pillowcase or place a damp towel between your skin and the bag.  Mix 3 parts water with 1 part rubbing alcohol. Freeze the mixture in a sealable plastic bag. When you remove the  mixture from the freezer, it will be slushy. Squeeze out the excess air. Place this bag inside another plastic bag. Slide the bag into a pillowcase or place a damp towel between your skin and the bag. SEEK MEDICAL CARE IF:  You develop white spots on your skin. This may give the skin a blotchy (mottled) appearance.  Your skin turns blue or pale.  Your skin becomes waxy or hard.  Your swelling gets worse. MAKE SURE YOU:   Understand these instructions.  Will watch your condition.  Will get help right away if you are not doing well or get worse. Document Released: 09/04/2010 Document Revised: 04/02/2011 Document Reviewed: 09/04/2010 [facility] Patient Information 2015 [place], [place]. This information is not intended to replace advice given to you by your health care provider. Make sure you discuss any questions you have with your health care provider.

## 2013-07-20 NOTE — ED Notes (Signed)
Pt c/o left leg pain onset today Reports she felt pain and a knot posterior of knee Pain increases w/activity Alert w/no signs of acute distress.

## 2013-07-20 NOTE — ED Provider Notes (Signed)
CSN: [id number]     Arrival date & time 07/20/13  1709 History   None    Chief Complaint  Patient presents with  . Leg Pain   (Consider location/radiation/quality/duration/timing/severity/associated sxs/prior Treatment)  HPI  The patient is a 53 year old female presenting today with reports of a sudden onset of posterior knee pain with swelling behind the knee only today after sitting at her desk for several hours. 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  Patient states any initially "felt tight" and that it was "hard to walk".  The patient denies any changes in sensation or numbness or tingling to LLE.  In addition, the patient states she has quit smoking.  Past Medical History  Diagnosis Date  . Smoker     ONE PACK PER WEEK  . Anemia   . Hypertension   . Benign colon polyp 08/2005    adenomatous  . Dysmenorrhea   . Fibroid   . Chest pain   . Irregular heart beat    Past Surgical History  Procedure Laterality Date  . 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 Umbilical hernia repair      AS A CHILD  . Abdominal hysterectomy  06/2001    Toney Rakes, M.D.  . Cardiac catheterization  2003    Dr Gwenlyn Found   Family History  Problem Relation Age of Onset  . Heart disease Mother   . Hypertension Mother   . Stroke Father   . Colon cancer Neg Hx   . Stomach cancer Neg Hx    History  Substance Use Topics  . Smoking status: Current Every Day Smoker -- 0.10 packs/day  . Smokeless tobacco: Never Used     Comment: only smokes 1 -2 cigarettes daily.  . Alcohol Use: 1.2 oz/week    2 Glasses of wine per week   OB History   Grav Para Term Preterm Abortions TAB SAB Ect Mult Living   3 1   2     1      Review of Systems  Constitutional: Negative.  Negative for fever.  HENT: Negative.   Eyes: Negative.   Respiratory: Negative.  Negative for cough, chest tightness and shortness of breath.     Cardiovascular: Negative.  Negative for chest pain and palpitations.  Gastrointestinal: Negative.   Endocrine: Negative.   Genitourinary: Negative.   Musculoskeletal: Positive for joint swelling.  Skin: Negative.  Negative for rash.  Allergic/Immunologic: Negative.   Neurological: Negative.   Hematological: Negative.   Psychiatric/Behavioral: Negative.     Allergies  Review of patient's allergies indicates no known allergies.  Home Medications   Prior to Admission medications   Medication Sig Start Date End Date Taking? Authorizing Provider  Calcium Citrate-Vitamin D 500-400 MG-UNIT CHEW Chew 1 tablet by mouth 2 (two) times daily. 10/05/11   Carolin Guernsey, MD  cephALEXin (KEFLEX) 500 MG capsule Take 1 capsule (500 mg total) by mouth 3 (three) times daily. 11/28/12   Amber Fidel Levy, MD  cetirizine (ZYRTEC ALLERGY) 10 MG tablet Take 1 tablet (10 mg total) by mouth daily. 11/28/12   Amber Fidel Levy, MD  estradiol (VIVELLE-DOT) 0.1 MG/24HR patch Place 1 patch (0.1 mg total) onto the skin 2 (two) times a week. 06/18/13   Huel Cote, NP  fluconazole (DIFLUCAN) 150 MG tablet Take 1 tablet (150 mg total) by mouth once. 11/28/12   Amber Jerilynn Mages  [place], MD  fluticasone (FLONASE) 50 MCG/ACT nasal spray Place 1 spray into the nose daily as needed. For congestion    Historical Provider, MD  hydrochlorothiazide (HYDRODIURIL) 25 MG tablet TAKE 1 TABLET (25 MG TOTAL) BY MOUTH DAILY. 07/02/13   Amber Fidel Levy, MD  HYDROcodone-acetaminophen (NORCO/VICODIN) 5-325 MG per tablet Take 1 tablet by mouth 2 (two) times daily as needed for pain. 07/14/12   Dickie La, MD  meloxicam (MOBIC) 7.5 MG tablet Take 1 tablet (7.5 mg total) by mouth daily. 07/20/13   Jacqualyn Posey, NP  Multiple Vitamin (MULTIVITAMIN) tablet Take 1 tablet by mouth daily.      Historical Provider, MD  oxyCODONE-acetaminophen (PERCOCET/ROXICET) 5-325 MG per tablet Take 1-2 tablets by mouth every 4 (four) hours as needed for severe pain.  04/25/13   Shari A Upstill, PA-C  pravastatin (PRAVACHOL) 40 MG tablet Take 1 tablet (40 mg total) by mouth daily. For cholesterol. 04/25/12   Carolin Guernsey, MD   BP 132/88  Pulse 82  Temp(Src) 97.8 F (36.6 C) (Oral)  Resp 14  SpO2 100%  Physical Exam  Nursing note and vitals reviewed. Constitutional: She appears well-developed and well-nourished. No distress.  Cardiovascular: Normal rate, regular rhythm, normal heart sounds and intact distal pulses.  Exam reveals no gallop and no friction rub.   No murmur heard. Pulmonary/Chest: Effort normal and breath sounds normal. No respiratory distress. She has no wheezes. She has no rales. She exhibits no tenderness.  Musculoskeletal: Normal range of motion. She exhibits edema and tenderness.       Left knee: She exhibits swelling. She exhibits normal range of motion, no effusion, no ecchymosis, no deformity, no laceration, no erythema, normal alignment, no LCL laxity and normal patellar mobility. Tenderness found.       Legs: Patient has band like area of tenderness and swelling with no erythema or warmth immediately superior to popliteal aspect of posterior L knee.  The swelling is not consistent with a Baker's cyst and patient denies any history of recent injury.    Patient is negative Homan's sign.  No LLE swelling apparently.  No pain or tenderness to LLE.  Full passive and active ROM present.  Bilateral 2+ pedal and posterior tibial pulses present.  CMS intact and cap refill is <3 seconds distally.   Neurological: She has normal reflexes.  Skin: She is not diaphoretic.     ED Course  Procedures (including critical care time) Labs Review Labs Reviewed - No data to display  Imaging Review No results found.    MDM   1. Knee pain, acute, left    Plan of care discussed with Dr. Juventino Slovak.  The patient to be seen by orthopedist tomorrow to evaluate further.  Discussed the signs and symptoms of deep vein thrombosis with patient patient  verbalizes understanding and agrees to return immediately to emergency department should any other signs or symptoms develop orworsen this evening.  The patient verbalizes understanding and agrees to plan of care.       Jacqualyn Posey, NP 07/20/13 1934

## 2013-07-21 ENCOUNTER — Telehealth: Payer: Self-pay | Admitting: Family Medicine

## 2013-07-21 NOTE — Telephone Encounter (Signed)
Pt called and asked that Dr. Nori Riis call her. She would not go into why. jw

## 2013-07-21 NOTE — Telephone Encounter (Signed)
Patient state that she went to [facility] yesterday for leg and calf swelling. They had informd her to follow up with her PCP. She was concerned about blood clot or other issues. Was wanting to know if she can be seen down in sports medicine. Wants to know what route she can go with following up with PCP.

## 2013-07-21 NOTE — Telephone Encounter (Signed)
LVM for patient to call back. ?

## 2013-07-23 NOTE — Telephone Encounter (Signed)
[place], can you please schedule her over at [facility]

## 2013-07-23 NOTE — Telephone Encounter (Signed)
.  she CAn f/u at [place]! Gabriela [name]

## 2013-07-27 ENCOUNTER — Telehealth: Payer: Self-pay | Admitting: *Deleted

## 2013-07-27 NOTE — Telephone Encounter (Signed)
Message copied by Johny Shears on Mon Jul 27, 2013 10:23 AM ------      Message from: Cyd Silence      Created: Thu Jul 23, 2013  4:10 PM       Got her scheduled to see Nori Riis on 08/03/13 at 3pm. Will you call her and let her know please.             thx ------

## 2013-07-28 ENCOUNTER — Other Ambulatory Visit: Payer: Self-pay

## 2013-07-28 DIAGNOSIS — Z1231 Encounter for screening mammogram for malignant neoplasm of breast: Secondary | ICD-10-CM

## 2013-08-03 ENCOUNTER — Ambulatory Visit: Payer: 59 | Admitting: Family Medicine

## 2013-08-07 ENCOUNTER — Ambulatory Visit: Payer: 59 | Admitting: Family Medicine

## 2013-08-08 ENCOUNTER — Other Ambulatory Visit: Payer: Self-pay | Admitting: Family Medicine

## 2013-08-10 ENCOUNTER — Other Ambulatory Visit: Payer: Self-pay | Admitting: Family Medicine

## 2013-08-10 NOTE — Telephone Encounter (Signed)
Please advise the patient she needs an appointment for a refill on this medication. Thanks.

## 2013-08-10 NOTE — Telephone Encounter (Signed)
LM for patient to call back.  Please assist her in making a follow up appt with her MD.  Thanks Johnney Ou

## 2013-08-15 ENCOUNTER — Emergency Department (HOSPITAL_COMMUNITY)
Admission: EM | Admit: 2013-08-15 | Discharge: 2013-08-15 | Disposition: A | Discharge status: Home / Self Care | Payer: 59 | Attending: Emergency Medicine | Admitting: Emergency Medicine

## 2013-08-15 ENCOUNTER — Encounter (HOSPITAL_COMMUNITY): Payer: Self-pay | Admitting: Emergency Medicine

## 2013-08-15 DIAGNOSIS — Z87828 Personal history of other (healed) physical injury and trauma: Secondary | ICD-10-CM | POA: Insufficient documentation

## 2013-08-15 DIAGNOSIS — R0789 Other chest pain: Secondary | ICD-10-CM | POA: Diagnosis not present

## 2013-08-15 DIAGNOSIS — Z860100 Personal history of colon polyps, unspecified: Secondary | ICD-10-CM | POA: Insufficient documentation

## 2013-08-15 DIAGNOSIS — M25472 Effusion, left ankle: Secondary | ICD-10-CM

## 2013-08-15 DIAGNOSIS — I10 Essential (primary) hypertension: Secondary | ICD-10-CM | POA: Diagnosis not present

## 2013-08-15 DIAGNOSIS — Z9889 Other specified postprocedural states: Secondary | ICD-10-CM | POA: Diagnosis not present

## 2013-08-15 DIAGNOSIS — IMO0002 Reserved for concepts with insufficient information to code with codable children: Secondary | ICD-10-CM | POA: Insufficient documentation

## 2013-08-15 DIAGNOSIS — Z792 Long term (current) use of antibiotics: Secondary | ICD-10-CM | POA: Insufficient documentation

## 2013-08-15 DIAGNOSIS — Z791 Long term (current) use of non-steroidal anti-inflammatories (NSAID): Secondary | ICD-10-CM | POA: Insufficient documentation

## 2013-08-15 DIAGNOSIS — Z79899 Other long term (current) drug therapy: Secondary | ICD-10-CM | POA: Insufficient documentation

## 2013-08-15 DIAGNOSIS — Z862 Personal history of diseases of the blood and blood-forming organs and certain disorders involving the immune mechanism: Secondary | ICD-10-CM | POA: Diagnosis not present

## 2013-08-15 DIAGNOSIS — R209 Unspecified disturbances of skin sensation: Secondary | ICD-10-CM | POA: Diagnosis not present

## 2013-08-15 DIAGNOSIS — M25476 Effusion, unspecified foot: Secondary | ICD-10-CM | POA: Diagnosis not present

## 2013-08-15 DIAGNOSIS — Z8742 Personal history of other diseases of the female genital tract: Secondary | ICD-10-CM | POA: Diagnosis not present

## 2013-08-15 DIAGNOSIS — M7989 Other specified soft tissue disorders: Secondary | ICD-10-CM

## 2013-08-15 DIAGNOSIS — M25473 Effusion, unspecified ankle: Secondary | ICD-10-CM | POA: Diagnosis not present

## 2013-08-15 DIAGNOSIS — F17200 Nicotine dependence, unspecified, uncomplicated: Secondary | ICD-10-CM | POA: Diagnosis not present

## 2013-08-15 DIAGNOSIS — M79609 Pain in unspecified limb: Secondary | ICD-10-CM

## 2013-08-15 DIAGNOSIS — M25579 Pain in unspecified ankle and joints of unspecified foot: Secondary | ICD-10-CM | POA: Diagnosis present

## 2013-08-15 NOTE — ED Provider Notes (Signed)
Medical screening examination/treatment/procedure(s) were performed by non-physician practitioner and as supervising physician I was immediately available for consultation/collaboration.   EKG Interpretation None        Malvin Johns, MD 08/15/13 2345

## 2013-08-15 NOTE — ED Notes (Signed)
Pt in c/o left ankle swelling, states she had an injury a few months ago- told it was a sprain- pt states she is having pain to her left calf, states it feels like a tightness, CMS intact

## 2013-08-15 NOTE — ED Notes (Signed)
PT undressed from waist down for doppler study.

## 2013-08-15 NOTE — Progress Notes (Signed)
VASCULAR LAB PRELIMINARY  PRELIMINARY  PRELIMINARY  PRELIMINARY  Left lower extremity venous Doppler completed.    Preliminary report:  There is no DVT or SVT noted in the left lower extremity.   Jovaun Levene, RVT 08/15/2013, 6:38 PM

## 2013-08-15 NOTE — ED Provider Notes (Signed)
CSN: [id number]     Arrival date & time 08/15/13  1633 History  This chart was scribed for non-physician practitioner working with Malvin Johns, MD by Mercy Moore, ED Scribe. This patient was seen in room TR10C/TR10C and the patient's care was started at 5:25 PM.   Chief Complaint  Patient presents with  . Leg Pain     The history is provided by the patient. No language interpreter was used.   HPI Comments: Gabriela [name] is a 53 y.o. female who presents to the Emergency Department complaining of left ankle and left leg and swelling, ongoing for two days. Patient reports injuring her left hip at work last year and having subsequent issues. This year in April, patient reports a left ankle sprain. She denies extensive swelling similar to her current symptoms as result of her sprain. Currently patient reports tingling and swelling in her left leg and swelling in her ankle. She denies true pain. She reports treatment with ibuprofen. Patient denies participation in any unusual activity, recent injury, history of back pain, recent travel or extended car rides.   At a previous job, patient reports standing for 12 hours each work day and having a to move heavy machinery which initiated her hip pain. Now the patient reports prolonged sitting at work.  Past Medical History  Diagnosis Date  . Smoker     ONE PACK PER WEEK  . Anemia   . Hypertension   . Benign colon polyp 08/2005    adenomatous  . Dysmenorrhea   . Fibroid   . Chest pain   . Irregular heart beat    Past Surgical History  Procedure Laterality Date  . 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 Umbilical hernia repair      AS A CHILD  . Abdominal hysterectomy  06/2001    Toney Rakes, M.D.  . Cardiac catheterization  2003    Dr Gwenlyn Found   Family History  Problem Relation Age of Onset  . Heart disease Mother   . Hypertension Mother   . Stroke Father   . Colon cancer Neg Hx   . Stomach cancer Neg Hx    History  Substance  Use Topics  . Smoking status: Current Every Day Smoker -- 0.10 packs/day  . Smokeless tobacco: Never Used     Comment: only smokes 1 -2 cigarettes daily.  . Alcohol Use: 1.2 oz/week    2 Glasses of wine per week   OB History   Grav Para Term Preterm Abortions TAB SAB Ect Mult Living   3 1   2     1      Review of Systems  Constitutional: Negative for fever and chills.  Respiratory: Negative for chest tightness.   Cardiovascular: Negative for chest pain.  Musculoskeletal: Positive for arthralgias and joint swelling. Negative for back pain.  Neurological: Negative for weakness and numbness.      Allergies  Review of patient's allergies indicates no known allergies.  Home Medications   Prior to Admission medications   Medication Sig Start Date End Date Taking? Authorizing Provider  Calcium Citrate-Vitamin D 500-400 MG-UNIT CHEW Chew 1 tablet by mouth 2 (two) times daily. 10/05/11   Carolin Guernsey, MD  cephALEXin (KEFLEX) 500 MG capsule Take 1 capsule (500 mg total) by mouth 3 (three) times daily. 11/28/12   Amber Fidel Levy, MD  cetirizine (ZYRTEC ALLERGY) 10 MG tablet Take 1 tablet (10 mg total) by mouth daily. 11/28/12  Amber Fidel Levy, MD  estradiol (VIVELLE-DOT) 0.1 MG/24HR patch Place 1 patch (0.1 mg total) onto the skin 2 (two) times a week. 06/18/13   Huel Cote, NP  fluconazole (DIFLUCAN) 150 MG tablet Take 1 tablet (150 mg total) by mouth once. 11/28/12   Amber Fidel Levy, MD  fluticasone (FLONASE) 50 MCG/ACT nasal spray Place 1 spray into the nose daily as needed. For congestion    Historical Provider, MD  hydrochlorothiazide (HYDRODIURIL) 25 MG tablet TAKE 1 TABLET (25 MG TOTAL) BY MOUTH DAILY.    Leone Haven, MD  HYDROcodone-acetaminophen (NORCO/VICODIN) 5-325 MG per tablet Take 1 tablet by mouth 2 (two) times daily as needed for pain. 07/14/12   Dickie La, MD  meloxicam (MOBIC) 7.5 MG tablet Take 1 tablet (7.5 mg total) by mouth daily. 07/20/13   Jacqualyn Posey,  NP  Multiple Vitamin (MULTIVITAMIN) tablet Take 1 tablet by mouth daily.      Historical Provider, MD  oxyCODONE-acetaminophen (PERCOCET/ROXICET) 5-325 MG per tablet Take 1-2 tablets by mouth every 4 (four) hours as needed for severe pain. 04/25/13   Shari A Upstill, PA-C  pravastatin (PRAVACHOL) 40 MG tablet Take 1 tablet (40 mg total) by mouth daily. For cholesterol. 04/25/12   Carolin Guernsey, MD   Triage Vitals: BP 151/74  Pulse 79  Temp(Src) 98.5 F (36.9 C) (Oral)  Resp 18  SpO2 98% Physical Exam  Nursing note and vitals reviewed. Constitutional: She is oriented to person, place, and time. She appears well-developed and well-nourished. No distress.  HENT:  Head: Normocephalic and atraumatic.  Eyes: EOM are normal.  Neck: Neck supple.  Cardiovascular: Normal rate.   Pulmonary/Chest: Effort normal. No respiratory distress.  Musculoskeletal: She exhibits tenderness.  Mild tenderness over lateral distal left leg, just above the ankle joint. No tenderness of her ankle joint of foot. Full range of motion of the ankle and foot with no pain. No pedal pulses intact. Cap refill less than 2 seconds in all toes. There is maybe minimal swelling over her left foot, trace pitting edema in both legs bilaterally. There is no Tenderness, negative Homans sign. Sensation equal bilaterally, however patient reports tingling sensation with palpation of the left lateral lower leg.  Neurological: She is alert and oriented to person, place, and time.  Skin: Skin is warm and dry.  Psychiatric: She has a normal mood and affect. Her behavior is normal.    ED Course  Procedures (including critical care time) COORDINATION OF CARE: 5:35 PM- Will order ultrasound. Discussed treatment plan with patient at bedside and patient agreed to plan.   Labs Review Labs Reviewed - No data to display  Imaging Review No results found.   EKG Interpretation None      MDM   Final diagnoses:  Left ankle swelling     Patient with chest tightness, tingling sensation over left lower leg, possible mild ankle and foot swelling. Examination unremarkable, patient denies any true pain, reports " the leg does not feel normal." Venous Doppler performed to rule out a blood clot and is negative. She is neurovascularly intact. No recent injuries, no x-rays are indicated. No signs of infection. Followup with primary care Dr., instructed to keep legs elevated, avoid salt, surgeon exercises. Return if any new concerning symptoms.   Filed Vitals:   08/15/13 1655  BP: 151/74  Pulse: 79  Temp: 98.5 F (36.9 C)  TempSrc: Oral  Resp: 18  SpO2: 98%    I personally performed  the services described in this documentation, which was scribed in my presence. The recorded information has been reviewed and is accurate.    Renold Genta, PA-C 08/15/13 1913

## 2013-08-15 NOTE — Discharge Instructions (Signed)
Keep legs elevated. Watch salt intake. Follow up with primary care doctor for recheck. Return if fever, increased pain, numbness, any new concerning symptoms.    Edema Edema is an abnormal buildup of fluids in your bodytissues. Edema is somewhatdependent on gravity to pull the fluid to the lowest place in your body. That makes the condition more common in the legs and thighs (lower extremities). Painless swelling of the feet and ankles is common and becomes more likely as you get older. It is also common in looser tissues, like around your eyes.  When the affected area is squeezed, the fluid may move out of that spot and leave a dent for a few moments. This dent is called pitting.  CAUSES  There are many possible causes of edema. Eating too much salt and being on your feet or sitting for a long time can cause edema in your legs and ankles. Hot weather may make edema worse. Common medical causes of edema include:  Heart failure.  Liver disease.  Kidney disease.  Weak blood vessels in your legs.  Cancer.  An injury.  Pregnancy.  Some medications.  Obesity. SYMPTOMS  Edema is usually painless.Your skin may look swollen or shiny.  DIAGNOSIS  Your health care provider may be able to diagnose edema by asking about your medical history and doing a physical exam. You may need to have tests such as X-rays, an electrocardiogram, or blood tests to check for medical conditions that may cause edema.  TREATMENT  Edema treatment depends on the cause. If you have heart, liver, or kidney disease, you need the treatment appropriate for these conditions. General treatment may include:  Elevation of the affected body part above the level of your heart.  Compression of the affected body part. Pressure from elastic bandages or support stockings squeezes the tissues and forces fluid back into the blood vessels. This keeps fluid from entering the tissues.  Restriction of fluid and salt  intake.  Use of a water pill (diuretic). These medications are appropriate only for some types of edema. They pull fluid out of your body and make you urinate more often. This gets rid of fluid and reduces swelling, but diuretics can have side effects. Only use diuretics as directed by your health care provider. HOME CARE INSTRUCTIONS   Keep the affected body part above the level of your heart when you are lying down.   Do not sit still or stand for prolonged periods.   Do not put anything directly under your knees when lying down.  Do not wear constricting clothing or garters on your upper legs.   Exercise your legs to work the fluid back into your blood vessels. This may help the swelling go down.   Wear elastic bandages or support stockings to reduce ankle swelling as directed by your health care provider.   Eat a low-salt diet to reduce fluid if your health care provider recommends it.   Only take medicines as directed by your health care provider. SEEK MEDICAL CARE IF:   Your edema is not responding to treatment.  You have heart, liver, or kidney disease and notice symptoms of edema.  You have edema in your legs that does not improve after elevating them.   You have sudden and unexplained weight gain. SEEK IMMEDIATE MEDICAL CARE IF:   You develop shortness of breath or chest pain.   You cannot breathe when you lie down.  You develop pain, redness, or warmth in the swollen areas.  You have heart, liver, or kidney disease and suddenly get edema.  You have a fever and your symptoms suddenly get worse. MAKE SURE YOU:   Understand these instructions.  Will watch your condition.  Will get help right away if you are not doing well or get worse. Document Released: 01/08/2005 Document Revised: 05/25/2013 Document Reviewed: 10/31/2012 [facility] Patient Information 2015 [place], [place]. This information is not intended to replace advice given to you by your health  care provider. Make sure you discuss any questions you have with your health care provider.

## 2013-08-18 ENCOUNTER — Ambulatory Visit
Admission: RE | Admit: 2013-08-18 | Discharge: 2013-08-18 | Disposition: A | Discharge status: Home / Self Care | Payer: 59 | Source: Ambulatory Visit

## 2013-08-18 DIAGNOSIS — Z1231 Encounter for screening mammogram for malignant neoplasm of breast: Secondary | ICD-10-CM

## 2013-08-25 ENCOUNTER — Other Ambulatory Visit: Payer: Self-pay | Admitting: Family Medicine

## 2013-08-25 ENCOUNTER — Encounter: Payer: Self-pay | Admitting: Family Medicine

## 2013-08-25 ENCOUNTER — Ambulatory Visit (INDEPENDENT_AMBULATORY_CARE_PROVIDER_SITE_OTHER): Payer: 59 | Admitting: Family Medicine

## 2013-08-25 VITALS — BP 120/90 | HR 78 | Temp 98.1°F | Ht 67.0 in | Wt 205.0 lb

## 2013-08-25 DIAGNOSIS — R1011 Right upper quadrant pain: Secondary | ICD-10-CM

## 2013-08-25 DIAGNOSIS — E785 Hyperlipidemia, unspecified: Secondary | ICD-10-CM

## 2013-08-25 DIAGNOSIS — I10 Essential (primary) hypertension: Secondary | ICD-10-CM

## 2013-08-25 MED ORDER — FLUTICASONE PROPIONATE 50 MCG/ACT NA SUSP: 1.0000 | Freq: Every day | NASAL | Status: DC | PRN

## 2013-08-25 MED ORDER — HYDROCHLOROTHIAZIDE 25 MG PO TABS: ORAL_TABLET | ORAL | Status: DC

## 2013-08-25 MED ORDER — HYDROXYZINE HCL 10 MG PO TABS: 10.0000 mg | ORAL_TABLET | Freq: Three times a day (TID) | ORAL | Status: DC | PRN

## 2013-08-25 MED ORDER — CALCIUM CITRATE-VITAMIN D 500-400 MG-UNIT PO CHEW: 1.0000 | CHEWABLE_TABLET | Freq: Two times a day (BID) | ORAL | Status: DC

## 2013-08-25 NOTE — Progress Notes (Signed)
Patient ID: Gabriela [name], female   DOB: 1960-05-14, 53 y.o.   MRN: [id number]  Tommi Rumps, MD Phone: [phone]  Gabriela [name] is a 53 y.o. female who presents today for f/u.  HYPERTENSION Disease Monitoring Home BP Monitoring not checking Chest pain- no    Dyspnea- no Medications Compliance-  Taking HCTZ.  Edema- no  HYPERLIPIDEMIA Symptoms Chest pain on exertion:  no   Leg claudication:   no Medications: Compliance- not taking statin anymore  Muscle aches- no  RUQ pain: notes one episode of sharp pain lasting for ~1 minute that occurred last week. Has not had this before. Has not had this since. She was sitting in the car when it happened. She notes it felt like it was in her stomach. It was not in her ribs. It did not radiate. She had not eaten recently. She can not pin point a precipitating factor.  Patient is a former smoker.   ROS: Per HPI   Physical Exam Filed Vitals:   08/25/13 1100  BP: 120/90  Pulse: 78  Temp: 98.1 F (36.7 C)    Gen: Well NAD HEENT: PERRL,  MMM Lungs: CTABL Nl WOB Heart: RRR no MRG Abd: NABS, NT, ND Exts: Non edematous BL  LE, warm and well perfused.    Assessment/Plan: Please see individual problem list.  # Healthcare maintenance: reports had pap smear done in May at [facility]

## 2013-08-25 NOTE — Patient Instructions (Addendum)
Nice to meet you. If that pain in your belly returns please let us know. For your dark eyes please try cold compresses in the morning. Please try to get more sleep. You can try an eye cream from the pharmacy as well. Please increase your exercise. Start walking for 30 minutes 5 days a week.  Please follow the diet provided.  Diet Recommendations  Starchy (carb) foods include: Bread, rice, pasta, potatoes, corn, crackers, bagels, muffins, all baked goods.   Protein foods include: Meat, fish, poultry, eggs, dairy foods, and beans such as pinto and kidney beans (beans also provide carbohydrate).   1. Eat at least 3 meals and 1-2 snacks per day. Never go more than 4-5 hours while awake without eating.  2. Limit starchy foods to TWO per meal and ONE per snack. ONE portion of a starchy  food is equal to the following:   - ONE slice of bread (or its equivalent, such as half of a hamburger bun).   - 1/2 cup of a "scoopable" starchy food such as potatoes or rice.   - 1 OUNCE (28 grams) of starchy snack foods such as crackers or pretzels (look on label).   - 15 grams of carbohydrate as shown on food label.  3. Both lunch and dinner should include a protein food, a carb food, and vegetables.   - Obtain twice as many veg's as protein or carbohydrate foods for both lunch and dinner.   - Try to keep frozen veg's on hand for a quick vegetable serving.     - Fresh or frozen veg's are best.  4. Breakfast should always include protein.

## 2013-08-26 ENCOUNTER — Encounter: Payer: Self-pay | Admitting: Family Medicine

## 2013-08-26 DIAGNOSIS — R1011 Right upper quadrant pain: Secondary | ICD-10-CM | POA: Insufficient documentation

## 2013-08-26 NOTE — Assessment & Plan Note (Signed)
Controlled on current medication. Will continue HCTZ at current dosing. F/u 3 months.

## 2013-08-26 NOTE — Assessment & Plan Note (Signed)
No longer taking statin. Lipids check at [facility] and noted today. Patient with 10 year risk of 2.5% meaning that she does not require a statin at this time. She is to work on diet and exercise. F/u 1 year.

## 2013-08-26 NOTE — Assessment & Plan Note (Signed)
One episode of brief sharp pain. Consideration of gall bladder pathology though this seems unlikely given the nature of the pain and single episode. Could be MSK cause as well. Patients exam is benign today. Will monitor for recurrence and patient to return to care if pain recurs.

## 2013-09-22 ENCOUNTER — Encounter: Payer: Self-pay | Admitting: *Deleted

## 2013-09-23 ENCOUNTER — Encounter (HOSPITAL_COMMUNITY): Payer: Self-pay | Admitting: *Deleted

## 2013-09-23 ENCOUNTER — Encounter: Payer: Self-pay | Admitting: Cardiovascular Disease

## 2013-09-23 ENCOUNTER — Ambulatory Visit (INDEPENDENT_AMBULATORY_CARE_PROVIDER_SITE_OTHER): Payer: 59 | Admitting: Cardiovascular Disease

## 2013-09-23 VITALS — BP 138/72 | HR 66 | Ht 67.0 in | Wt 209.0 lb

## 2013-09-23 DIAGNOSIS — I10 Essential (primary) hypertension: Secondary | ICD-10-CM

## 2013-09-23 DIAGNOSIS — R072 Precordial pain: Secondary | ICD-10-CM

## 2013-09-23 DIAGNOSIS — R079 Chest pain, unspecified: Secondary | ICD-10-CM | POA: Insufficient documentation

## 2013-09-23 DIAGNOSIS — Z79899 Other long term (current) drug therapy: Secondary | ICD-10-CM

## 2013-09-23 DIAGNOSIS — E785 Hyperlipidemia, unspecified: Secondary | ICD-10-CM

## 2013-09-23 MED ORDER — PRAVASTATIN SODIUM 40 MG PO TABS: 40.0000 mg | ORAL_TABLET | Freq: Every evening | ORAL | Status: DC

## 2013-09-23 NOTE — Patient Instructions (Signed)
Dr Gwenlyn Found has recommended making the following medication changes:  START Pravastatin (Pravachol) 40 mg - take 1 tablet daily  Your physician has ordered you to have blood work done FASTING in 2-3 months.  Your physician has requested that you have an exercise tolerance test. For further information please visit [email or web address]. Please also follow instruction sheet, as given.  Dr Gwenlyn Found wants you to follow-up in 1 year. You will receive a reminder letter in the mail two months in advance. If you don't receive a letter, please call our office to schedule the follow-up appointment.

## 2013-09-23 NOTE — Progress Notes (Signed)
09/23/2013 Gabriela [name]   [birth year]  [id number]  Primary Physician Tommi Rumps, MD Primary Cardiologist: Lorretta Harp MD Renae Gloss   HPI:  Ms. Staunton is a 53 year old moderately overweight single African American female mother of one child, grandmother and 2 grandchildren who I last saw in the office 08/01/09. Her cardiac risk factors include family history with a mother who died myocardial infarction at age 31, hypertension, hyperlipidemia and continued tobacco abuse. She did have a heart catheterization which I performed 07/06/04 which wasn't normal. She does complain of occasional chest pain which is new over the last 6 months occurring several times a week.   Current Outpatient Prescriptions  Medication Sig Dispense Refill  . calcium citrate-vitamin D 500-400 MG-UNIT chewable tablet Chew 1 tablet by mouth 2 (two) times daily.  60 tablet  12  . cetirizine (ZYRTEC ALLERGY) 10 MG tablet Take 1 tablet (10 mg total) by mouth daily.  30 tablet  11  . estradiol (VIVELLE-DOT) 0.1 MG/24HR patch Place 1 patch (0.1 mg total) onto the skin 2 (two) times a week.  8 patch  12  . fluticasone (FLONASE) 50 MCG/ACT nasal spray Place 1 spray into both nostrils daily as needed for allergies. For congestion  16 g  1  . hydrochlorothiazide (HYDRODIURIL) 25 MG tablet TAKE 1 TABLET (25 MG TOTAL) BY MOUTH DAILY.  30 tablet  5  . HYDROcodone-acetaminophen (NORCO/VICODIN) 5-325 MG per tablet Take 1 tablet by mouth 2 (two) times daily as needed for pain.  60 tablet  0  . hydrOXYzine (ATARAX/VISTARIL) 10 MG tablet Take 1 tablet (10 mg total) by mouth 3 (three) times daily as needed for itching.  30 tablet  0  . meloxicam (MOBIC) 7.5 MG tablet Take 1 tablet (7.5 mg total) by mouth daily.  30 tablet  0  . Multiple Vitamin (MULTIVITAMIN) tablet Take 1 tablet by mouth daily.        . pravastatin (PRAVACHOL) 40 MG tablet Take 1 tablet (40 mg total) by mouth every evening.  30 tablet  11   No  current facility-administered medications for this visit.    No Known Allergies  History   Social History  . Marital Status: Single    Spouse Name: N/A    Number of Children: N/A  . Years of Education: N/A   Occupational History  . Unemployed     Social History Main Topics  . Smoking status: Former Smoker -- 0.10 packs/day    Quit date: 07/25/2013  . Smokeless tobacco: Never Used     Comment: only smokes 1 -2 cigarettes daily.  . Alcohol Use: 1.2 oz/week    2 Glasses of wine per week  . Drug Use: No  . Sexual Activity: Not Currently   Other Topics Concern  . Not on file   Social History Narrative  . No narrative on file     Review of Systems: General: negative for chills, fever, night sweats or weight changes.  Cardiovascular: negative for chest pain, dyspnea on exertion, edema, orthopnea, palpitations, paroxysmal nocturnal dyspnea or shortness of breath Dermatological: negative for rash Respiratory: negative for cough or wheezing Urologic: negative for hematuria Abdominal: negative for nausea, vomiting, diarrhea, bright red blood per rectum, melena, or hematemesis Neurologic: negative for visual changes, syncope, or dizziness All other systems reviewed and are otherwise negative except as noted above.    Blood pressure 138/72, pulse 66, height 5\' 7"  (1.702 m), weight 209 lb (94.802 kg).  General  appearance: alert and no distress Neck: no adenopathy, no carotid bruit, no JVD, supple, symmetrical, trachea midline and thyroid not enlarged, symmetric, no tenderness/mass/nodules Lungs: clear to auscultation bilaterally Heart: regular rate and rhythm, S1, S2 normal, no murmur, click, rub or gallop Extremities: extremities normal, atraumatic, no cyanosis or edema  EKG normal sinus rhythm at 66 without ST or T wave changes  ASSESSMENT AND PLAN:   HYPERLIPIDEMIA Currently not on statin therapy. Her most recent lipid profile performed 06/18/13 revealed a total  cholesterol 212, LDL 131 and HDL of 68. I'm going to begin her on Pravachol 40 mg a day and will recheck a lipid and liver profile  Hypertension Controlled on current medications  Chest pain Patient relates chest pain occurring 2 times a week. It is usually fairly chronic in nature. She does with late some left upper extremity radiation. This has occurred over the last 6 months. I did perform a cardiac catheterization on her 07/06/04 which was completely normal. Risk factors include hyperlipidemia, tobacco abuse, hypertension and family history with a mother who died of a massive myocardial infarction at age 5. I'm going to get a routine Bruce protocol exercise stress test      Lorretta Harp MD [facility], [facility] 09/23/2013 11:22 AM

## 2013-09-23 NOTE — Assessment & Plan Note (Signed)
Patient relates chest pain occurring 2 times a week. It is usually fairly chronic in nature. She does with late some left upper extremity radiation. This has occurred over the last 6 months. I did perform a cardiac catheterization on her 07/06/04 which was completely normal. Risk factors include hyperlipidemia, tobacco abuse, hypertension and family history with a mother who died of a massive myocardial infarction at age 53. I'm going to get a routine Bruce protocol exercise stress test

## 2013-09-23 NOTE — Assessment & Plan Note (Signed)
Currently not on statin therapy. Her most recent lipid profile performed 06/18/13 revealed a total cholesterol 212, LDL 131 and HDL of 68. I'm going to begin her on Pravachol 40 mg a day and will recheck a lipid and liver profile

## 2013-09-23 NOTE — Assessment & Plan Note (Signed)
Controlled on current medications 

## 2013-09-24 ENCOUNTER — Encounter (HOSPITAL_COMMUNITY): Payer: Self-pay | Admitting: Emergency Medicine

## 2013-09-24 ENCOUNTER — Emergency Department (HOSPITAL_COMMUNITY): Payer: 59

## 2013-09-24 ENCOUNTER — Ambulatory Visit (HOSPITAL_BASED_OUTPATIENT_CLINIC_OR_DEPARTMENT_OTHER)
Admission: RE | Admit: 2013-09-24 | Discharge: 2013-09-24 | Disposition: A | Discharge status: Home / Self Care | Payer: 59 | Source: Ambulatory Visit | Attending: Cardiovascular Disease | Admitting: Cardiovascular Disease

## 2013-09-24 ENCOUNTER — Emergency Department (HOSPITAL_COMMUNITY)
Admission: EM | Admit: 2013-09-24 | Discharge: 2013-09-24 | Disposition: A | Discharge status: Home / Self Care | Payer: 59 | Attending: Emergency Medicine | Admitting: Emergency Medicine

## 2013-09-24 DIAGNOSIS — Z860100 Personal history of colon polyps, unspecified: Secondary | ICD-10-CM | POA: Insufficient documentation

## 2013-09-24 DIAGNOSIS — Z90710 Acquired absence of both cervix and uterus: Secondary | ICD-10-CM | POA: Insufficient documentation

## 2013-09-24 DIAGNOSIS — Z862 Personal history of diseases of the blood and blood-forming organs and certain disorders involving the immune mechanism: Secondary | ICD-10-CM | POA: Insufficient documentation

## 2013-09-24 DIAGNOSIS — E785 Hyperlipidemia, unspecified: Secondary | ICD-10-CM | POA: Diagnosis not present

## 2013-09-24 DIAGNOSIS — R071 Chest pain on breathing: Secondary | ICD-10-CM | POA: Insufficient documentation

## 2013-09-24 DIAGNOSIS — I10 Essential (primary) hypertension: Secondary | ICD-10-CM | POA: Diagnosis not present

## 2013-09-24 DIAGNOSIS — Z8742 Personal history of other diseases of the female genital tract: Secondary | ICD-10-CM | POA: Diagnosis not present

## 2013-09-24 DIAGNOSIS — R1011 Right upper quadrant pain: Secondary | ICD-10-CM | POA: Insufficient documentation

## 2013-09-24 DIAGNOSIS — Z87891 Personal history of nicotine dependence: Secondary | ICD-10-CM | POA: Insufficient documentation

## 2013-09-24 DIAGNOSIS — Z79899 Other long term (current) drug therapy: Secondary | ICD-10-CM | POA: Diagnosis not present

## 2013-09-24 DIAGNOSIS — Z9889 Other specified postprocedural states: Secondary | ICD-10-CM | POA: Diagnosis not present

## 2013-09-24 DIAGNOSIS — R079 Chest pain, unspecified: Secondary | ICD-10-CM

## 2013-09-24 LAB — COMPREHENSIVE METABOLIC PANEL
ALT: 25 U/L (ref 0–35)
AST: 20 U/L (ref 0–37)
Albumin: 3.8 g/dL (ref 3.5–5.2)
Alkaline Phosphatase: 136 U/L — ABNORMAL HIGH (ref 39–117)
Anion gap: 14 (ref 5–15)
BILIRUBIN TOTAL: 0.2 mg/dL — AB (ref 0.3–1.2)
BUN: 14 mg/dL (ref 6–23)
CALCIUM: 9.8 mg/dL (ref 8.4–10.5)
CO2: 26 mEq/L (ref 19–32)
CREATININE: 1.16 mg/dL — AB (ref 0.50–1.10)
Chloride: 100 mEq/L (ref 96–112)
GFR calc non Af Amer: 53 mL/min — ABNORMAL LOW (ref >90)
GFR, EST AFRICAN AMERICAN: 61 mL/min — AB (ref >90)
GLUCOSE: 132 mg/dL — AB (ref 70–99)
Potassium: 3.4 mEq/L — ABNORMAL LOW (ref 3.7–5.3)
SODIUM: 140 meq/L (ref 137–147)
Total Protein: 7.5 g/dL (ref 6.0–8.3)

## 2013-09-24 LAB — CBC
HCT: 38.5 % (ref 36.0–46.0)
HEMOGLOBIN: 12.7 g/dL (ref 12.0–15.0)
MCH: 27.3 pg (ref 26.0–34.0)
MCHC: 33 g/dL (ref 30.0–36.0)
MCV: 82.6 fL (ref 78.0–100.0)
Platelets: 318 10*3/uL (ref 150–400)
RBC: 4.66 MIL/uL (ref 3.87–5.11)
RDW: 13.7 % (ref 11.5–15.5)
WBC: 7.6 10*3/uL (ref 4.0–10.5)

## 2013-09-24 LAB — URINALYSIS, ROUTINE W REFLEX MICROSCOPIC
BILIRUBIN URINE: NEGATIVE
GLUCOSE, UA: NEGATIVE mg/dL
Hgb urine dipstick: NEGATIVE
KETONES UR: NEGATIVE mg/dL
Leukocytes, UA: NEGATIVE
Nitrite: NEGATIVE
PH: 6 (ref 5.0–8.0)
Protein, ur: NEGATIVE mg/dL
Specific Gravity, Urine: 1.019 (ref 1.005–1.030)
Urobilinogen, UA: 1 mg/dL (ref 0.0–1.0)

## 2013-09-24 LAB — LIPASE, BLOOD: Lipase: 20 U/L (ref 11–59)

## 2013-09-24 LAB — D-DIMER, QUANTITATIVE: D-Dimer, Quant: <0.27 ug/mL-FEU (ref 0.00–0.48)

## 2013-09-24 LAB — I-STAT TROPONIN, ED: TROPONIN I, POC: 0 ng/mL (ref 0.00–0.08)

## 2013-09-24 MED ORDER — TRAMADOL HCL 50 MG PO TABS: 50.0000 mg | ORAL_TABLET | Freq: Four times a day (QID) | ORAL | Status: DC | PRN

## 2013-09-24 MED ORDER — TRAMADOL HCL 50 MG PO TABS
50.0000 mg | ORAL_TABLET | Freq: Once | ORAL | Status: AC
  Administered 2013-09-24: 50 mg via ORAL
  Filled 2013-09-24: qty 1

## 2013-09-24 NOTE — ED Notes (Signed)
Pt. On cardiac monitor. 

## 2013-09-24 NOTE — Discharge Instructions (Signed)

## 2013-09-24 NOTE — ED Provider Notes (Signed)
CSN: [id number]     Arrival date & time 09/24/13  0038 History   First MD Initiated Contact with Patient 09/24/13 0158     Chief Complaint  Patient presents with  . Abdominal Pain     (Consider location/radiation/quality/duration/timing/severity/associated sxs/prior Treatment) HPI 53 year old female presents to the emergency apartment with complaint of chest pain and abdominal pain.  Patient reports over the last 2-3 weeks she has had a sharp pain just under her left breast with some left neck pain.  She was seen yesterday by her cardiologist and is scheduled for an outpatient stress test today.  Around 11 PM, patient had onset of right upper quadrant/right lower chest pain that was sharp lasting seconds and radiating up into her right neck.  She denies any nausea vomiting diaphoresis or shortness of breath.  She reports symptoms lasted for about an hour.  Patient reports she still gets slight twinges of pain here and there.  Patient denies previous history of same Past Medical History  Diagnosis Date  . Smoker     ONE PACK PER WEEK  . Anemia   . Hypertension   . Benign colon polyp 08/2005    adenomatous  . Dysmenorrhea   . Fibroid   . Chest pain   . Irregular heart beat   . Hyperlipidemia    Past Surgical History  Procedure Laterality Date  . 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 Umbilical hernia repair      AS A CHILD  . Abdominal hysterectomy  06/2001    Toney Rakes, M.D.  . Cardiac catheterization  2003    Dr Gwenlyn Found  . Transthoracic echocardiogram  07/03/04    TRACE MR,TRACE TR,TRACE PVR  . Cardiolite myocardial perfusion  06/07/03    NO PERFUSION ABNORMALITY. EF% 68%.  . Lower ext arterial  07/28/08    NORMAL   Family History  Problem Relation Age of Onset  . Heart disease Mother   . Hypertension Mother   . Stroke Father   . Colon cancer Neg Hx   . Stomach cancer Neg Hx    History  Substance Use Topics  . Smoking status: Former Smoker -- 0.10 packs/day   Quit date: 07/25/2013  . Smokeless tobacco: Never Used     Comment: only smokes 1 -2 cigarettes daily.  . Alcohol Use: 1.2 oz/week    2 Glasses of wine per week   OB History   Grav Para Term Preterm Abortions TAB SAB Ect Mult Living   3 1   2     1      Review of Systems   See History of Present Illness; otherwise all other systems are reviewed and negative  Allergies  Review of patient's allergies indicates no known allergies.  Home Medications   Prior to Admission medications   Medication Sig Start Date End Date Taking? Authorizing Provider  cholecalciferol (VITAMIN D) 1000 UNITS tablet Take 1,000 Units by mouth daily.   Yes Historical Provider, MD  estradiol (VIVELLE-DOT) 0.1 MG/24HR patch Place 1 patch (0.1 mg total) onto the skin 2 (two) times a week. 06/18/13  Yes Huel Cote, NP  hydrochlorothiazide (HYDRODIURIL) 25 MG tablet Take 25 mg by mouth daily.   Yes Historical Provider, MD  hydrOXYzine (ATARAX/VISTARIL) 10 MG tablet Take 1 tablet (10 mg total) by mouth 3 (three) times daily as needed for itching. 08/25/13  Yes Leone Haven, MD  pravastatin (PRAVACHOL) 40 MG tablet Take 40 mg by mouth daily.  Yes Historical Provider, MD  traMADol (ULTRAM) 50 MG tablet Take 1 tablet (50 mg total) by mouth every 6 (six) hours as needed for moderate pain or severe pain. 09/24/13   Kalman Drape, MD   BP 148/76  Pulse 71  Temp(Src) 98 F (36.7 C) (Oral)  Resp 20  SpO2 100% Physical Exam  Nursing note and vitals reviewed. Constitutional: She is oriented to person, place, and time. She appears well-developed and well-nourished.  HENT:  Head: Normocephalic and atraumatic.  Right Ear: External ear normal.  Left Ear: External ear normal.  Nose: Nose normal.  Mouth/Throat: Oropharynx is clear and moist.  Eyes: Conjunctivae and EOM are normal. Pupils are equal, round, and reactive to light.  Neck: Normal range of motion. Neck supple. No JVD present. No tracheal deviation present. No  thyromegaly present.  Cardiovascular: Normal rate, regular rhythm, normal heart sounds and intact distal pulses.  Exam reveals no gallop and no friction rub.   No murmur heard. Pulmonary/Chest: Effort normal and breath sounds normal. No stridor. No respiratory distress. She has no wheezes. She has no rales. She exhibits tenderness (ttp over lower chest wall without crepitus).  Abdominal: Soft. Bowel sounds are normal. She exhibits no distension and no mass. There is no tenderness. There is no rebound and no guarding.  Musculoskeletal: Normal range of motion. She exhibits no edema and no tenderness.  Lymphadenopathy:    She has no cervical adenopathy.  Neurological: She is oriented to person, place, and time. She has normal reflexes. No cranial nerve deficit. She exhibits normal muscle tone. Coordination normal.  Skin: Skin is dry. No rash noted. No erythema. No pallor.  Psychiatric: She has a normal mood and affect. Her behavior is normal. Judgment and thought content normal.    ED Course  Procedures (including critical care time) Labs Review Labs Reviewed  COMPREHENSIVE METABOLIC PANEL - Abnormal; Notable for the following:    Potassium 3.4 (*)    Glucose, Bld 132 (*)    Creatinine, Ser 1.16 (*)    Alkaline Phosphatase 136 (*)    Total Bilirubin 0.2 (*)    GFR calc non Af Amer 53 (*)    GFR calc Af Amer 61 (*)    All other components within normal limits  URINALYSIS, ROUTINE W REFLEX MICROSCOPIC - Abnormal; Notable for the following:    APPearance CLOUDY (*)    All other components within normal limits  CBC  LIPASE, BLOOD  D-DIMER, QUANTITATIVE  I-STAT TROPOININ, ED    Imaging Review Dg Chest Port 1 View  09/24/2013   CLINICAL DATA:  Right upper abdominal pain radiating to the chest and neck.  EXAM: PORTABLE CHEST - 1 VIEW  COMPARISON:  12/08/2011  FINDINGS: Heart size and mediastinal contours within normal range. Lungs are clear. No pleural effusion or pneumothorax. No acute  osseous finding.  IMPRESSION: No radiographic evidence of active cardiopulmonary disease.   Electronically Signed   By: Carlos Levering M.D.   On: 09/24/2013 01:14     EKG Interpretation   Date/Time:  Thursday September 24 2013 00:51:03 EDT Ventricular Rate:  82 PR Interval:  153 QRS Duration: 90 QT Interval:  372 QTC Calculation: 434 R Axis:   47 Text Interpretation:  Sinus rhythm LAE, consider biatrial enlargement  Confirmed by Janelly Switalski  MD, Brinson Tozzi ([id number]) on 09/24/2013 1:05:38 AM      MDM   Final diagnoses:  Chest pain, unspecified chest pain type    53 year old female with diffuse pains  across her chest.  Patient also having some mild right upper quadrant pain, none now.  Symptoms seem atypical for biliary colic.  D-dimer is negative no shortness of breath or tachycardia.  Silva Bandy is a PE.  Patient has followup with cardiology scheduled later today for stress test.  We'll start her on Ultram for her pain I suspect is musculoskeletal in origin.    Kalman Drape, MD 09/24/13 [phone]

## 2013-09-24 NOTE — ED Notes (Signed)
Pt c/o abd pain that radiates to her left side. Pt states she saw her cardiologist today and ekg was done. Pt states she is due for a stress test in the am. Pt denies any sob, diaphoresis. Pt does c/o of pressure on left side of neck.

## 2013-09-24 NOTE — Procedures (Signed)
Exercise Treadmill Test   Test  Exercise Tolerance Test Ordering MD: Quay Burow, MD  Interpreting MD:   Unique Test No:1  Treadmill:  1  Indication for ETT: chest pain - rule out ischemia  Contraindication to ETT: No   Stress Modality: exercise - treadmill  Cardiac Imaging Performed: non   Protocol: standard Bruce - maximal  Max BP:  198/66  Max MPHR (bpm):167 85% MPR (bpm): 142  MPHR obtained (bpm):  166 % MPHR obtained: 99  Reached 85% MPHR (min:sec): 6:45 Total Exercise Time (min-sec): 9:02  Workload in METS:  10.10 Borg Scale:   Reason ETT Terminated:  fatigue    ST Segment Analysis At Rest: normal ST segments - no evidence of significant ST depression With Exercise: no evidence of significant ST depression  Other Information Arrhythmia:  No Angina during ETT:  absent (0) Quality of ETT:  diagnostic  ETT Interpretation:  normal - no evidence of ischemia by ST analysis  Comments: Hypertensive response to exercise. Good exercise tolerance Duke Treadmill Score of +9  Pixie Casino, MD, [facility] Attending Cardiologist [place]

## 2013-09-24 NOTE — ED Notes (Signed)
EKG given to EDP Otter for review 

## 2013-09-25 ENCOUNTER — Encounter: Payer: Self-pay | Admitting: *Deleted

## 2013-11-23 ENCOUNTER — Encounter (HOSPITAL_COMMUNITY): Payer: Self-pay | Admitting: Emergency Medicine

## 2014-02-02 ENCOUNTER — Encounter: Payer: Self-pay | Admitting: Family Medicine

## 2014-02-02 ENCOUNTER — Ambulatory Visit (INDEPENDENT_AMBULATORY_CARE_PROVIDER_SITE_OTHER): Payer: BC Managed Care – PPO | Admitting: Family Medicine

## 2014-02-02 VITALS — BP 167/95 | HR 86 | Ht 67.0 in | Wt 215.0 lb

## 2014-02-02 DIAGNOSIS — R1084 Generalized abdominal pain: Secondary | ICD-10-CM

## 2014-02-02 DIAGNOSIS — R109 Unspecified abdominal pain: Secondary | ICD-10-CM | POA: Insufficient documentation

## 2014-02-02 MED ORDER — POLYETHYLENE GLYCOL 3350 17 GM/SCOOP PO POWD: 17.0000 g | Freq: Two times a day (BID) | ORAL | Status: DC | PRN

## 2014-02-02 NOTE — Patient Instructions (Signed)
Diet and Irritable Bowel Syndrome  No cure has been found for irritable bowel syndrome (IBS). Many options are available to treat the symptoms. Your caregiver will give you the best treatments available for your symptoms. He or she will also encourage you to manage stress and to make changes to your diet. You need to work with your caregiver and Registered Dietician to find the best combination of medicine, diet, counseling, and support to control your symptoms. The following are some diet suggestions. FOODS THAT MAKE IBS WORSE  Fatty foods, such as [place] fries.  Milk products, such as cheese or ice cream.  Chocolate.  Alcohol.  Caffeine (found in coffee and some sodas).  Carbonated drinks, such as soda. If certain foods cause symptoms, you should eat less of them or stop eating them. FOOD JOURNAL   Keep a journal of the foods that seem to cause distress. Write down:  What you are eating during the day and when.  What problems you are having after eating.  When the symptoms occur in relation to your meals.  What foods always make you feel badly.  Take your notes with you to your caregiver to see if you should stop eating certain foods. FOODS THAT MAKE IBS BETTER Fiber reduces IBS symptoms, especially constipation, because it makes stools soft, bulky, and easier to pass. Fiber is found in bran, bread, cereal, beans, fruit, and vegetables. Examples of foods with fiber include:  Apples.  Peaches.  Pears.  Berries.  Figs.  Broccoli, raw.  Cabbage.  Carrots.  Raw peas.  Kidney beans.  Lima beans.  Whole-grain bread.  Whole-grain cereal. Add foods with fiber to your diet a little at a time. This will let your body get used to them. Too much fiber at once might cause gas and swelling of your abdomen. This can trigger symptoms in a person with IBS. Caregivers usually recommend a diet with enough fiber to produce soft, painless bowel movements. High fiber diets may  cause gas and bloating. However, these symptoms often go away within a few weeks, as your body adjusts. In many cases, dietary fiber may lessen IBS symptoms, particularly constipation. However, it may not help pain or diarrhea. High fiber diets keep the colon mildly enlarged (distended) with the added fiber. This may help prevent spasms in the colon. Some forms of fiber also keep water in the stool, thereby preventing hard stools that are difficult to pass.  Besides telling you to eat more foods with fiber, your caregiver may also tell you to get more fiber by taking a fiber pill or drinking water mixed with a special high fiber powder. An example of this is a natural fiber laxative containing psyllium seed.  TIPS  Large meals can cause cramping and diarrhea in people with IBS. If this happens to you, try eating 4 or 5 small meals a day, or try eating less at each of your usual 3 meals. It may also help if your meals are low in fat and high in carbohydrates. Examples of carbohydrates are pasta, rice, whole-grain breads and cereals, fruits, and vegetables.  If dairy products cause your symptoms to flare up, you can try eating less of those foods. You might be able to handle yogurt better than other dairy products, because it contains bacteria that helps with digestion. Dairy products are an important source of calcium and other nutrients. If you need to avoid dairy products, be sure to talk with a Registered Dietitian about getting these nutrients  through other food sources.  Drink enough water and fluids to keep your urine clear or pale yellow. This is important, especially if you have diarrhea. FOR MORE INFORMATION  International Foundation for Functional Gastrointestinal Disorders: www.iffgd.org  National Digestive Diseases Information Clearinghouse: digestive.AmenCredit.is Document Released: 03/31/2003 Document Revised: 04/02/2011 Document Reviewed: 04/10/2013 [facility] Patient Information 2015  [place], [place]. This information is not intended to replace advice given to you by your health care provider. Make sure you discuss any questions you have with your health care provider.

## 2014-02-02 NOTE — Assessment & Plan Note (Signed)
Likely functional abdominal pain/IBS, high stress level, relieved with BMs, some constipation with it, exam benign - trial of IBS diet recs and miralax - referral to GI (patient has seen them in the past and needs re-referral to be seen again soon)

## 2014-02-02 NOTE — Progress Notes (Signed)
   Subjective:    Patient ID: Gabriela [name], female    DOB: 1960/11/22, 54 y.o.   MRN: [id number]  HPI Pt presents for f/u of chronic abdominal pain. Reports she has had this off and on for years but this time started about 1 month ago. Dull ache throughout stomach, feels like "gas pain." Reports it gets better after BM but comes right back. Stools every 2-3 days and somewhat hard. Reports her whole family has "stomach issues" and she is having a lot of stress at work right now which seems to be linked. No fever, bloody stool, n/v.   Review of Systems See HPI    Objective:   Physical Exam  Constitutional: She is oriented to person, place, and time. She appears well-developed and well-nourished. No distress.  HENT:  Head: Normocephalic and atraumatic.  Eyes: Conjunctivae are normal. Right eye exhibits no discharge. Left eye exhibits no discharge. No scleral icterus.  Cardiovascular: Normal rate.   Pulmonary/Chest: Effort normal.  Abdominal: Soft. Bowel sounds are normal. She exhibits no distension and no mass. There is no tenderness. There is no rebound and no guarding.  Neurological: She is alert and oriented to person, place, and time.  Skin: Skin is warm and dry. No rash noted. She is not diaphoretic.  Psychiatric: She has a normal mood and affect. Her behavior is normal.  Nursing note and vitals reviewed.         Assessment & Plan:

## 2014-03-17 ENCOUNTER — Ambulatory Visit: Payer: BC Managed Care – PPO

## 2014-03-28 ENCOUNTER — Other Ambulatory Visit: Payer: Self-pay | Admitting: Family Medicine

## 2014-03-29 NOTE — Telephone Encounter (Signed)
Refills given. Patient needs follow-up for blood pressure.

## 2014-03-29 NOTE — Telephone Encounter (Signed)
Letter mailed informing patient that she needs a follow up appt. Jazmin Hartsell,CMA

## 2014-04-30 ENCOUNTER — Other Ambulatory Visit: Payer: Self-pay | Admitting: *Deleted

## 2014-04-30 MED ORDER — PRAVASTATIN SODIUM 40 MG PO TABS: 40.0000 mg | ORAL_TABLET | Freq: Every day | ORAL | Status: DC

## 2014-05-19 ENCOUNTER — Other Ambulatory Visit: Payer: Self-pay | Admitting: Women's Health

## 2014-05-19 ENCOUNTER — Telehealth: Payer: Self-pay | Admitting: *Deleted

## 2014-05-19 MED ORDER — ESTRADIOL 0.05 MG/24HR TD PTTW: 1.0000 | MEDICATED_PATCH | TRANSDERMAL | Status: DC

## 2014-05-19 NOTE — Telephone Encounter (Signed)
Pt informed with the below note, rx sen to [facility] per pt request

## 2014-05-19 NOTE — Telephone Encounter (Signed)
Pt was prescribed vivelle dot patch 0.1 mg on May 2015, took for about 4 months and stopped because it made her breast sore. Patient is not taking now, was trying Black cohosh but this didn't work as well. She asked if you have any recommendations? Please advise

## 2014-05-19 NOTE — Telephone Encounter (Signed)
Have her try Vivelle patch 0.05 twice weekly, her annual exam is due next month can evaluate then. Reviewed to place on lower abdomen or hips never near breasts.

## 2014-06-02 ENCOUNTER — Ambulatory Visit (INDEPENDENT_AMBULATORY_CARE_PROVIDER_SITE_OTHER): Payer: PRIVATE HEALTH INSURANCE | Admitting: Family Medicine

## 2014-06-02 ENCOUNTER — Encounter: Payer: Self-pay | Admitting: Family Medicine

## 2014-06-02 VITALS — BP 132/87 | HR 91 | Temp 98.6°F | Ht 67.0 in | Wt 208.1 lb

## 2014-06-02 DIAGNOSIS — I10 Essential (primary) hypertension: Secondary | ICD-10-CM | POA: Diagnosis not present

## 2014-06-02 DIAGNOSIS — F320 Major depressive disorder, single episode, mild: Secondary | ICD-10-CM | POA: Diagnosis not present

## 2014-06-02 DIAGNOSIS — M25473 Effusion, unspecified ankle: Secondary | ICD-10-CM

## 2014-06-02 DIAGNOSIS — R609 Edema, unspecified: Secondary | ICD-10-CM

## 2014-06-02 LAB — COMPREHENSIVE METABOLIC PANEL
ALBUMIN: 4.1 g/dL (ref 3.5–5.2)
ALT: 23 U/L (ref 0–35)
AST: 15 U/L (ref 0–37)
Alkaline Phosphatase: 89 U/L (ref 39–117)
BUN: 16 mg/dL (ref 6–23)
CHLORIDE: 107 meq/L (ref 96–112)
CO2: 32 mEq/L (ref 19–32)
CREATININE: 0.77 mg/dL (ref 0.50–1.10)
Calcium: 10.2 mg/dL (ref 8.4–10.5)
GLUCOSE: 58 mg/dL — AB (ref 70–99)
Potassium: 4.7 mEq/L (ref 3.5–5.3)
SODIUM: 143 meq/L (ref 135–145)
Total Bilirubin: 0.4 mg/dL (ref 0.2–1.2)
Total Protein: 6.9 g/dL (ref 6.0–8.3)

## 2014-06-02 LAB — CBC
HCT: 38.3 % (ref 36.0–46.0)
Hemoglobin: 12.8 g/dL (ref 12.0–15.0)
MCH: 27.5 pg (ref 26.0–34.0)
MCHC: 33.4 g/dL (ref 30.0–36.0)
MCV: 82.2 fL (ref 78.0–100.0)
MPV: 9 fL (ref 8.6–12.4)
PLATELETS: 394 10*3/uL (ref 150–400)
RBC: 4.66 MIL/uL (ref 3.87–5.11)
RDW: 14.2 % (ref 11.5–15.5)
WBC: 8.6 10*3/uL (ref 4.0–10.5)

## 2014-06-02 NOTE — Patient Instructions (Signed)
Nice to see you. We will check blood work to evaluate for a cause of your edema.  If you develop shortness of breath, difficulty breathing when laying down, worsening swelling, chest pain, or swelling in one calf but not the other please seek medical attention.  If you start to feel depressed or like you are going to hurt yourself or someone else please seek medical attention.

## 2014-06-03 LAB — TSH: TSH: 1.339 u[IU]/mL (ref 0.350–4.500)

## 2014-06-04 ENCOUNTER — Encounter: Payer: Self-pay | Admitting: Family Medicine

## 2014-06-04 DIAGNOSIS — M25473 Effusion, unspecified ankle: Secondary | ICD-10-CM | POA: Insufficient documentation

## 2014-06-04 NOTE — Assessment & Plan Note (Signed)
Apparent exacerbation for a couple of days last week. Denies symptoms now. Discussed monitoring this and if recurs to let us know. No SI or HI. Advised of return precautions.

## 2014-06-04 NOTE — Progress Notes (Signed)
Patient ID: [name], female   DOB: 1960/12/28, 54 y.o.   MRN: [id number]  Tommi Rumps, MD Phone: [phone]  Gabriela [name] is a 54 y.o. female who presents today for f/u.  HYPERTENSION Disease Monitoring Home BP Monitoring not checking Chest pain- no    Dyspnea- no Medications Compliance-  Taking HCTZ.  Edema- yes, see below  LE edema: notes mild ankle swelling for the past several months intermittent in nature. L>R. No orthopnea, dyspnea, chest pain, pain or swelling in calves, or prior ankle injury. No history of DVT. No history of thyroid issues or renal issues per her knowledge. Notes this improves with propping feet up. No recent surgeries or long trips.  Depression: notes feeling depressed lask week for a couple of days. Now feels ok. No issues at this time. Has a history of depression though has not previously been on medications. No SI or HI. PHQ9 3.    PMH: smoker.   ROS: Per HPI   Physical Exam Filed Vitals:   06/02/14 1507  BP: 132/87  Pulse: 91  Temp: 98.6 F (37 C)    Gen: Well NAD HEENT: PERRL,  MMM, normal thyroid, no cervical LAD Lungs: CTABL Nl WOB Heart: RRR, no murmur appreciated, no JVD MSK: no calf tenderness or swelling, bilateral calves 40 cm Exts: Non edematous BL  LE, warm and well perfused.    Assessment/Plan: Please see individual problem list.  Tommi Rumps, MD [place] PGY-3

## 2014-06-04 NOTE — Assessment & Plan Note (Signed)
No apparent edema today. No signs of CHF and no symptoms at this time. Calves same diameter and no history of clot making DVT unlikely. Improves with elevation so could be venous insufficiency. Last Cr elevated, so will need to check for persistent renal dysfunction. Check CBC and TSH as well for additional causes. Given return precautions.

## 2014-06-04 NOTE — Assessment & Plan Note (Signed)
At goal. Continue HCTZ. Check CMET today.  

## 2014-06-07 ENCOUNTER — Telehealth: Payer: Self-pay | Admitting: Family Medicine

## 2014-06-07 NOTE — Telephone Encounter (Signed)
Will forward to PCP for lab results. Adalynd Donahoe, CMA.

## 2014-06-07 NOTE — Telephone Encounter (Signed)
Attempted to call patient to inform of lab results. No answer. Left message advising to call office. Labs results were normal with exception of glucose which was 58 which is low. Patient does not appear to be on diabetic medications. If she calls back could you please confirm that she is not on any diabetic medications. Thanks.

## 2014-06-07 NOTE — Telephone Encounter (Signed)
Would like results from blood work done last wednesday

## 2014-06-08 NOTE — Telephone Encounter (Signed)
Spoke with patient and she is aware of labs.  She is not taking any diabetic medication and would like to know what you think caused this low number.  Patient was not fasting for these labs. Lorelle Macaluso,CMA

## 2014-06-10 NOTE — Telephone Encounter (Signed)
Called patient to discuss blood glucose. Asked if she had any sweating, jitteriness, or shakiness the day of the appointment and she denied this. Notes no symptoms like this. She wonders if taking cinnamon has anything to do with this. Does note feeling more stressed out recently with not having a job. Advised that I would like for her to come back in to recheck her glucose and offered an appointment for tomorrow, though she declined this stating that her insurance does not kick in until June and would prefer to come in at that time. Advised of return precautions and scheduled appointment for June 2nd for follow-up and recheck of glucose.

## 2014-06-19 ENCOUNTER — Emergency Department (HOSPITAL_COMMUNITY)
Admission: EM | Admit: 2014-06-19 | Discharge: 2014-06-19 | Disposition: A | Discharge status: Home / Self Care | Payer: PRIVATE HEALTH INSURANCE | Attending: Emergency Medicine | Admitting: Emergency Medicine

## 2014-06-19 ENCOUNTER — Encounter (HOSPITAL_COMMUNITY): Payer: Self-pay | Admitting: *Deleted

## 2014-06-19 DIAGNOSIS — Z720 Tobacco use: Secondary | ICD-10-CM | POA: Diagnosis not present

## 2014-06-19 DIAGNOSIS — F419 Anxiety disorder, unspecified: Secondary | ICD-10-CM | POA: Insufficient documentation

## 2014-06-19 DIAGNOSIS — Z86010 Personal history of colonic polyps: Secondary | ICD-10-CM | POA: Insufficient documentation

## 2014-06-19 DIAGNOSIS — Z9889 Other specified postprocedural states: Secondary | ICD-10-CM | POA: Insufficient documentation

## 2014-06-19 DIAGNOSIS — Z8742 Personal history of other diseases of the female genital tract: Secondary | ICD-10-CM | POA: Insufficient documentation

## 2014-06-19 DIAGNOSIS — R002 Palpitations: Secondary | ICD-10-CM

## 2014-06-19 DIAGNOSIS — E785 Hyperlipidemia, unspecified: Secondary | ICD-10-CM | POA: Insufficient documentation

## 2014-06-19 DIAGNOSIS — Z793 Long term (current) use of hormonal contraceptives: Secondary | ICD-10-CM | POA: Insufficient documentation

## 2014-06-19 DIAGNOSIS — R208 Other disturbances of skin sensation: Secondary | ICD-10-CM | POA: Diagnosis present

## 2014-06-19 DIAGNOSIS — Z79899 Other long term (current) drug therapy: Secondary | ICD-10-CM | POA: Diagnosis not present

## 2014-06-19 DIAGNOSIS — Z86018 Personal history of other benign neoplasm: Secondary | ICD-10-CM | POA: Insufficient documentation

## 2014-06-19 DIAGNOSIS — I10 Essential (primary) hypertension: Secondary | ICD-10-CM | POA: Diagnosis not present

## 2014-06-19 DIAGNOSIS — Z862 Personal history of diseases of the blood and blood-forming organs and certain disorders involving the immune mechanism: Secondary | ICD-10-CM | POA: Diagnosis not present

## 2014-06-19 LAB — I-STAT CHEM 8, ED
BUN: 15 mg/dL (ref 6–20)
CALCIUM ION: 1.09 mmol/L — AB (ref 1.12–1.23)
Chloride: 101 mmol/L (ref 101–111)
Creatinine, Ser: 0.8 mg/dL (ref 0.44–1.00)
Glucose, Bld: 109 mg/dL — ABNORMAL HIGH (ref 65–99)
HEMATOCRIT: 43 % (ref 36.0–46.0)
Hemoglobin: 14.6 g/dL (ref 12.0–15.0)
Potassium: 3.7 mmol/L (ref 3.5–5.1)
Sodium: 140 mmol/L (ref 135–145)
TCO2: 23 mmol/L (ref 0–100)

## 2014-06-19 LAB — CBG MONITORING, ED: Glucose-Capillary: 110 mg/dL — ABNORMAL HIGH (ref 65–99)

## 2014-06-19 NOTE — ED Notes (Signed)
The pt feels like her heart is beating fast

## 2014-06-19 NOTE — ED Provider Notes (Addendum)
CSN: [id number]     Arrival date & time 06/19/14  1554 History   First MD Initiated Contact with Patient 06/19/14 1612     Chief Complaint  Patient presents with  . nervous jittery      (Consider location/radiation/quality/duration/timing/severity/associated sxs/prior Treatment) HPI Patient became nervous and felt as if her heart was beating fast onset one hour ago. Symptoms lasted 5-10 minutes and resolve spontaneously. She is presently asymptomatic no treatment prior to coming here. Nothing made symptoms better or worse. She denies any other associated symptoms no chest pain no lightheadedness no abdominal pain no headache. Denies caffeine use Past Medical History  Diagnosis Date  . Smoker     ONE PACK PER WEEK  . Anemia   . Hypertension   . Benign colon polyp 08/2005    adenomatous  . Dysmenorrhea   . Fibroid   . Chest pain   . Irregular heart beat   . Hyperlipidemia    Past Surgical History  Procedure Laterality Date  . 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 Umbilical hernia repair      AS A CHILD  . Abdominal hysterectomy  06/2001    Toney Rakes, M.D.  . Cardiac catheterization  2003    Dr Gwenlyn Found  . Transthoracic echocardiogram  07/03/04    TRACE MR,TRACE TR,TRACE PVR  . Cardiolite myocardial perfusion  06/07/03    NO PERFUSION ABNORMALITY. EF% 68%.  . Lower ext arterial  07/28/08    NORMAL   Family History  Problem Relation Age of Onset  . Heart disease Mother   . Hypertension Mother   . Stroke Father   . Colon cancer Neg Hx   . Stomach cancer Neg Hx    History  Substance Use Topics  . Smoking status: Current Every Day Smoker -- 0.10 packs/day    Last Attempt to Quit: 07/25/2013  . Smokeless tobacco: Never Used     Comment: only smokes 1 -2 cigarettes daily.  . Alcohol Use: 1.2 oz/week    2 Glasses of wine per week   OB History    Gravida Para Term Preterm AB TAB SAB Ectopic Multiple Living   3 1   2     1      Review of Systems  Constitutional:  Negative.   HENT: Negative.   Respiratory: Negative.   Cardiovascular: Positive for palpitations.  Gastrointestinal: Negative.   Musculoskeletal: Negative.   Skin: Negative.   Neurological: Negative.   Psychiatric/Behavioral: The patient is nervous/anxious.   All other systems reviewed and are negative.     Allergies  Review of patient's allergies indicates no known allergies.  Home Medications   Prior to Admission medications   Medication Sig Start Date End Date Taking? Authorizing Provider  cholecalciferol (VITAMIN D) 1000 UNITS tablet Take 1,000 Units by mouth daily.    Historical Provider, MD  estradiol (VIVELLE-DOT) 0.05 MG/24HR patch Place 1 patch (0.05 mg total) onto the skin 2 (two) times a week. 05/19/14   Huel Cote, NP  hydrochlorothiazide (HYDRODIURIL) 25 MG tablet TAKE 1 TABLET BY MOUTH DAILY. 03/29/14   Leone Haven, MD  hydrOXYzine (ATARAX/VISTARIL) 10 MG tablet Take 1 tablet (10 mg total) by mouth 3 (three) times daily as needed for itching. 08/25/13   Leone Haven, MD  polyethylene glycol powder (GLYCOLAX/MIRALAX) powder Take 17 g by mouth 2 (two) times daily as needed. 02/02/14   Frazier Richards, MD  pravastatin (PRAVACHOL) 40 MG tablet Take 1  tablet (40 mg total) by mouth daily. 04/30/14   Lorretta Harp, MD  traMADol (ULTRAM) 50 MG tablet Take 1 tablet (50 mg total) by mouth every 6 (six) hours as needed for moderate pain or severe pain. 09/24/13   Linton Flemings, MD   BP 150/84 mmHg  Pulse 97  Temp(Src) 98 F (36.7 C) (Oral)  Resp 18  Ht 5\' 6"  (1.676 m)  Wt 209 lb 8 oz (95.029 kg)  BMI 33.83 kg/m2  SpO2 100% Physical Exam  Constitutional: She appears well-developed and well-nourished.  HENT:  Head: Normocephalic and atraumatic.  Eyes: Conjunctivae are normal. Pupils are equal, round, and reactive to light.  Neck: Neck supple. No tracheal deviation present. No thyromegaly present.  Cardiovascular: Normal rate and regular rhythm.   No murmur  heard. Pulmonary/Chest: Effort normal and breath sounds normal.  Abdominal: Soft. Bowel sounds are normal. She exhibits no distension. There is no tenderness.  Musculoskeletal: Normal range of motion. She exhibits no edema or tenderness.  Neurological: She is alert. Coordination normal.  Skin: Skin is warm and dry. No rash noted.  Psychiatric: She has a normal mood and affect.  Nursing note and vitals reviewed.   ED Course  Procedures (including critical care time) Labs Review Labs Reviewed - No data to display  Imaging Review No results found.   EKG Interpretation   Date/Time:  Saturday Jun 19 2014 16:14:28 EDT Ventricular Rate:  86 PR Interval:  140 QRS Duration: 78 QT Interval:  376 QTC Calculation: 449 R Axis:   48 Text Interpretation:  Normal sinus rhythm Biatrial enlargement Abnormal  ECG No significant change since last tracing Confirmed by Winfred Leeds  MD,  Sinclaire Artiga [phone]) on 06/19/2014 4:52:26 PM     5 PM remains asymptomatic. Results for orders placed or performed during the hospital encounter of 06/19/14  I-Stat Chem 8, ED  Result Value Ref Range   Sodium 140 135 - 145 mmol/L   Potassium 3.7 3.5 - 5.1 mmol/L   Chloride 101 101 - 111 mmol/L   BUN 15 6 - 20 mg/dL   Creatinine, Ser 0.80 0.44 - 1.00 mg/dL   Glucose, Bld 109 (H) 65 - 99 mg/dL   Calcium, Ion 1.09 (L) 1.12 - 1.23 mmol/L   TCO2 23 0 - 100 mmol/L   Hemoglobin 14.6 12.0 - 15.0 g/dL   HCT 43.0 36.0 - 46.0 %  CBG monitoring, ED  Result Value Ref Range   Glucose-Capillary 110 (H) 65 - 99 mg/dL   Comment 1 Notify RN    No results found.  MDM  Plan follow-up family practice Center next week Diagnosis palpitations Final diagnoses:  None        Orlie Dakin, MD 06/19/14 [place], MD 06/19/14 1712

## 2014-06-19 NOTE — Discharge Instructions (Signed)
Palpitations Call the family practice Center on Tuesday, 06/22/2014 to schedule an office visit. Tell office staff that you were seen here when scheduling the appointment. Return if you feel worse for any reason. A palpitation is the feeling that your heartbeat is irregular. It may feel like your heart is fluttering or skipping a beat. It may also feel like your heart is beating faster than normal. This is usually not a serious problem. In some cases, you may need more medical tests. HOME CARE  Avoid:  Caffeine in coffee, tea, soft drinks, diet pills, and energy drinks.  Chocolate.  Alcohol.  Stop smoking if you smoke.  Reduce your stress and anxiety. Try:  A method that measures bodily functions so you can learn to control them (biofeedback).  Yoga.  Meditation.  Physical activity such as swimming, jogging, or walking.  Get plenty of rest and sleep. GET HELP IF:  Your fast or irregular heartbeat continues after 24 hours.  Your palpitations occur more often. GET HELP RIGHT AWAY IF:   You have chest pain.  You feel short of breath.  You have a very bad headache.  You feel dizzy or pass out (faint). MAKE SURE YOU:   Understand these instructions.  Will watch your condition.  Will get help right away if you are not doing well or get worse. Document Released: 10/18/2007 Document Revised: 05/25/2013 Document Reviewed: 03/09/2011 [facility] Patient Information 2015 [place], [place]. This information is not intended to replace advice given to you by your health care provider. Make sure you discuss any questions you have with your health care provider.

## 2014-06-19 NOTE — ED Notes (Signed)
Pt remains monitored by blood pressure and pulse ox.  

## 2014-06-19 NOTE — ED Notes (Signed)
The pt is c/o being nervous and jittery for one hour.  No nausea  lmp  None.  She last ate approx 1-2 hours ago

## 2014-06-22 ENCOUNTER — Encounter: Payer: Self-pay | Admitting: Women's Health

## 2014-06-24 ENCOUNTER — Ambulatory Visit: Payer: PRIVATE HEALTH INSURANCE | Admitting: Family Medicine

## 2014-07-08 ENCOUNTER — Encounter: Payer: Self-pay | Admitting: Women's Health

## 2014-07-09 ENCOUNTER — Other Ambulatory Visit: Payer: Self-pay

## 2014-07-09 DIAGNOSIS — N644 Mastodynia: Secondary | ICD-10-CM

## 2014-07-12 ENCOUNTER — Ambulatory Visit (INDEPENDENT_AMBULATORY_CARE_PROVIDER_SITE_OTHER): Payer: 59 | Admitting: Gynecology

## 2014-07-12 ENCOUNTER — Encounter: Payer: Self-pay | Admitting: Gynecology

## 2014-07-12 VITALS — BP 132/88

## 2014-07-12 DIAGNOSIS — N644 Mastodynia: Secondary | ICD-10-CM | POA: Insufficient documentation

## 2014-07-12 NOTE — Progress Notes (Signed)
   Patient is a 54 year old who presented to the office today complaining of several months of left upper outer quadrant tenderness. Patient denies palpating any masses. She denied any recent trauma or injury. She denies any nipple discharge. She denies any family history of breast cancer. She smokes on and off at times. Patient several years ago had left breast cyst aspiration. She had a normal mammogram in 2015. Patient is no longer hormone replacement therapy she's buy over-the-counter at the health food store Black cohosh. She denies any increase in caffeine intake.  Breast exam: Both breasts examined in sitting and supine position. Left breast is slightly larger than the right but according to the patient this is normal for her. There was no skin discoloration or nipple inversion no skin retraction no supraclavicular axillary lymphadenopathy or any palpable masses on either breast.  Assessment/plan: Patient with pendulous breasts should try to find some other form of brought support may be the reason for her tenderness on the left breast. I did not palpate any masses. I would recommend that when she has her screening mammogram to request a three-dimensional mammogram. Also have offered her to buy over-the-counter vitamin D 600 units that she can take 1 by mouth daily. We discussed also the detrimental effects of smoking which also be contributory of breast tenderness as well as well as lung cancer. She is due for her annual exam later this month.

## 2014-07-12 NOTE — Patient Instructions (Signed)

## 2014-07-20 ENCOUNTER — Encounter: Payer: Self-pay | Admitting: Women's Health

## 2014-08-16 ENCOUNTER — Other Ambulatory Visit: Payer: Self-pay | Admitting: *Deleted

## 2014-08-17 MED ORDER — HYDROCHLOROTHIAZIDE 25 MG PO TABS: 25.0000 mg | ORAL_TABLET | Freq: Every day | ORAL | Status: DC

## 2014-09-21 ENCOUNTER — Encounter: Payer: Self-pay | Admitting: Family Medicine

## 2014-09-21 ENCOUNTER — Ambulatory Visit (INDEPENDENT_AMBULATORY_CARE_PROVIDER_SITE_OTHER): Payer: 59 | Admitting: Family Medicine

## 2014-09-21 VITALS — BP 148/87 | HR 74 | Temp 98.1°F | Ht 66.0 in | Wt 205.6 lb

## 2014-09-21 DIAGNOSIS — G4700 Insomnia, unspecified: Secondary | ICD-10-CM | POA: Diagnosis not present

## 2014-09-21 DIAGNOSIS — I10 Essential (primary) hypertension: Secondary | ICD-10-CM | POA: Diagnosis not present

## 2014-09-21 MED ORDER — ZALEPLON 5 MG PO CAPS: 5.0000 mg | ORAL_CAPSULE | Freq: Every evening | ORAL | Status: DC | PRN

## 2014-09-21 NOTE — Patient Instructions (Signed)
Insomnia Insomnia is frequent trouble falling and/or staying asleep. Insomnia can be a long term problem or a short term problem. Both are common. Insomnia can be a short term problem when the wakefulness is related to a certain stress or worry. Long term insomnia is often related to ongoing stress during waking hours and/or poor sleeping habits. Overtime, sleep deprivation itself can make the problem worse. Every little thing feels more severe because you are overtired and your ability to cope is decreased. CAUSES   Stress, anxiety, and depression.  Poor sleeping habits.  Distractions such as TV in the bedroom.  Naps close to bedtime.  Engaging in emotionally charged conversations before bed.  Technical reading before sleep.  Alcohol and other sedatives. They may make the problem worse. They can hurt normal sleep patterns and normal dream activity.  Stimulants such as caffeine for several hours prior to bedtime.  Pain syndromes and shortness of breath can cause insomnia.  Exercise late at night.  Changing time zones may cause sleeping problems (jet lag). It is sometimes helpful to have someone observe your sleeping patterns. They should look for periods of not breathing during the night (sleep apnea). They should also look to see how long those periods last. If you live alone or observers are uncertain, you can also be observed at a sleep clinic where your sleep patterns will be professionally monitored. Sleep apnea requires a checkup and treatment. Give your caregivers your medical history. Give your caregivers observations your family has made about your sleep.  SYMPTOMS   Not feeling rested in the morning.  Anxiety and restlessness at bedtime.  Difficulty falling and staying asleep. TREATMENT   Your caregiver may prescribe treatment for an underlying medical disorders. Your caregiver can give advice or help if you are using alcohol or other drugs for self-medication. Treatment  of underlying problems will usually eliminate insomnia problems.  Medications can be prescribed for short time use. They are generally not recommended for lengthy use.  Over-the-counter sleep medicines are not recommended for lengthy use. They can be habit forming.  You can promote easier sleeping by making lifestyle changes such as:  Using relaxation techniques that help with breathing and reduce muscle tension.  Exercising earlier in the day.  Changing your diet and the time of your last meal. No night time snacks.  Establish a regular time to go to bed.  Counseling can help with stressful problems and worry.  Soothing music and white noise may be helpful if there are background noises you cannot remove.  Stop tedious detailed work at least one hour before bedtime. HOME CARE INSTRUCTIONS   Keep a diary. Inform your caregiver about your progress. This includes any medication side effects. See your caregiver regularly. Take note of:  Times when you are asleep.  Times when you are awake during the night.  The quality of your sleep.  How you feel the next day. This information will help your caregiver care for you.  Get out of bed if you are still awake after 15 minutes. Read or do some quiet activity. Keep the lights down. Wait until you feel sleepy and go back to bed.  Keep regular sleeping and waking hours. Avoid naps.  Exercise regularly.  Avoid distractions at bedtime. Distractions include watching television or engaging in any intense or detailed activity like attempting to balance the household checkbook.  Develop a bedtime ritual. Keep a familiar routine of bathing, brushing your teeth, climbing into bed at the same   time each night, listening to soothing music. Routines increase the success of falling to sleep faster.  Use relaxation techniques. This can be using breathing and muscle tension release routines. It can also include visualizing peaceful scenes. You can  also help control troubling or intruding thoughts by keeping your mind occupied with boring or repetitive thoughts like the old concept of counting sheep. You can make it more creative like imagining planting one beautiful flower after another in your backyard garden.  During your day, work to eliminate stress. When this is not possible use some of the previous suggestions to help reduce the anxiety that accompanies stressful situations. MAKE SURE YOU:   Understand these instructions.  Will watch your condition.  Will get help right away if you are not doing well or get worse. Document Released: 01/06/2000 Document Revised: 04/02/2011 Document Reviewed: 02/05/2007 [employer] Patient Information 2015 [employer]. This information is not intended to replace advice given to you by your health care provider. Make sure you discuss any questions you have with your health care provider.  

## 2014-09-24 DIAGNOSIS — G4700 Insomnia, unspecified: Secondary | ICD-10-CM | POA: Insufficient documentation

## 2014-09-24 NOTE — Progress Notes (Signed)
Subjective:     Patient ID: Gabriela [name], female   DOB: 05-Sep-1960, 54 y.o.   MRN: [id number]  HPI [name] is a 54yo female presenting with sleep difficulties. - Reports 1-78month history of difficulty sleeping - Difficulty falling asleep, not waking up multiple times during night - States she has a lot on her mind and this always keeps her up at night. Current stressor is temporary job - Has been using hydroxyzine for allergies which also helps with sleep when she uses it, but this leaves her drowsy in the morning - Uninterested in melatonin - Would like another medication to help with sleep that would not leave her drowsy in the morning - Has been working on sleep hygiene without effect. Tries to get in bed early with plenty of time to unwind and fall asleep, but unable to do so. Does not watch tv or use her phone prior to bed. Keeps similar routine. Has tried moving bed time to better help her get to sleep, even moving it as early at 6:30pm so she would have more time to fall asleep. - Hypertension stable  Review of Systems Per HPI    Objective:   Physical Exam  HENT:  Head: Normocephalic and atraumatic.  Mouth/Throat: Oropharynx is clear and moist.  Cardiovascular: Normal rate and regular rhythm.  Exam reveals no gallop and no friction rub.   No murmur heard. Pulmonary/Chest: Effort normal. No respiratory distress. She has no wheezes. She has no rales.  Abdominal: Soft. She exhibits no distension. There is no tenderness. There is no rebound.      Assessment and Plan:     Insomnia - No improvement with change in sleep hygiene - Not interested in melatonin or OTC medications - Trial of Sonata given, #30. Discussed that this may not be a long term medication. Short half-life should limit drowsiness in morning - Counseled against driving while drowsy or after taking Sonata. - Follow up with PCP  Hypertension - Stable. Continue to monitor.

## 2014-09-24 NOTE — Assessment & Plan Note (Signed)
Stable.       - Continue to monitor

## 2014-09-24 NOTE — Assessment & Plan Note (Addendum)
-   No improvement with change in sleep hygiene - Not interested in melatonin or OTC medications - Trial of Sonata given, #30. Discussed that this may not be a long term medication. Short half-life should limit drowsiness in morning - Counseled against driving while drowsy or after taking Sonata. - Follow up with PCP

## 2014-10-06 ENCOUNTER — Ambulatory Visit (INDEPENDENT_AMBULATORY_CARE_PROVIDER_SITE_OTHER): Payer: 59 | Admitting: Family Medicine

## 2014-10-06 ENCOUNTER — Encounter: Payer: Self-pay | Admitting: Family Medicine

## 2014-10-06 VITALS — BP 126/79 | HR 94 | Temp 97.9°F | Ht 67.0 in | Wt 206.1 lb

## 2014-10-06 DIAGNOSIS — I10 Essential (primary) hypertension: Secondary | ICD-10-CM

## 2014-10-06 NOTE — Patient Instructions (Signed)
Thanks for coming in today.   Congratulations on your health!  You are doing a great job with persevering through a tough time.   Let us know if there is anything we can do for you.   Thanks for letting us take care of you.   Sincerely,  Paula Compton, MD Family Medicine - PGY 2

## 2014-10-12 ENCOUNTER — Encounter: Payer: Self-pay | Admitting: Cardiovascular Disease

## 2014-10-12 ENCOUNTER — Ambulatory Visit (INDEPENDENT_AMBULATORY_CARE_PROVIDER_SITE_OTHER): Payer: 59 | Admitting: Cardiovascular Disease

## 2014-10-12 VITALS — BP 138/82 | HR 70 | Ht 67.0 in | Wt 207.7 lb

## 2014-10-12 DIAGNOSIS — Z720 Tobacco use: Secondary | ICD-10-CM

## 2014-10-12 DIAGNOSIS — I10 Essential (primary) hypertension: Secondary | ICD-10-CM

## 2014-10-12 DIAGNOSIS — Z87891 Personal history of nicotine dependence: Secondary | ICD-10-CM | POA: Diagnosis not present

## 2014-10-12 NOTE — Progress Notes (Signed)
10/12/2014 Gabriela [name]   January 22, 1961  [id number]  Primary Physician Paula Compton, MD Primary Cardiologist: Lorretta Harp MD Renae Gloss   HPI:  Gabriela [name] is a 54 year old moderately overweight single African American female mother of one child, grandmother and 2 grandchildren who I last saw in the office 09/23/13. Her cardiac risk factors include family history with a mother who died myocardial infarction at age 80, hypertension, hyperlipidemia and continued tobacco abuse. She did have a heart catheterization which I performed 07/06/04 which wasn't normal. She does complain of occasional chest pain occurring every several months. These have not changed in frequency or severity.   Current Outpatient Prescriptions  Medication Sig Dispense Refill  . Black Cohosh (BLACK COHOSH HOT FLASH RELIEF) 40 MG CAPS Take 80 mg by mouth daily.    . cholecalciferol (VITAMIN D) 1000 UNITS tablet Take 1,000 Units by mouth daily.    . hydrochlorothiazide (HYDRODIURIL) 25 MG tablet Take 1 tablet (25 mg total) by mouth daily. 30 tablet 3  . hydrOXYzine (ATARAX/VISTARIL) 10 MG tablet Take 1 tablet (10 mg total) by mouth 3 (three) times daily as needed for itching. 30 tablet 0  . zaleplon (SONATA) 5 MG capsule Take 1 capsule (5 mg total) by mouth at bedtime as needed for sleep. 30 capsule 0   No current facility-administered medications for this visit.    No Known Allergies  Social History   Social History  . Marital Status: Single    Spouse Name: N/A  . Number of Children: N/A  . Years of Education: N/A   Occupational History  . Unemployed     Social History Main Topics  . Smoking status: Current Every Day Smoker -- 0.10 packs/day    Last Attempt to Quit: 07/25/2013  . Smokeless tobacco: Never Used     Comment: only smokes 1 -2 cigarettes daily.  . Alcohol Use: 1.2 oz/week    2 Glasses of wine per week  . Drug Use: No  . Sexual Activity: Not Currently   Other Topics  Concern  . Not on file   Social History Narrative     Review of Systems: General: negative for chills, fever, night sweats or weight changes.  Cardiovascular: negative for chest pain, dyspnea on exertion, edema, orthopnea, palpitations, paroxysmal nocturnal dyspnea or shortness of breath Dermatological: negative for rash Respiratory: negative for cough or wheezing Urologic: negative for hematuria Abdominal: negative for nausea, vomiting, diarrhea, bright red blood per rectum, melena, or hematemesis Neurologic: negative for visual changes, syncope, or dizziness All other systems reviewed and are otherwise negative except as noted above.    Blood pressure 138/82, pulse 70, height 5\' 7"  (1.702 m), weight 207 lb 11.2 oz (94.212 kg).  General appearance: alert and no distress Neck: no adenopathy, no carotid bruit, no JVD, supple, symmetrical, trachea midline and thyroid not enlarged, symmetric, no tenderness/mass/nodules Lungs: clear to auscultation bilaterally Heart: regular rate and rhythm, S1, S2 normal, no murmur, click, rub or gallop Extremities: extremities normal, atraumatic, no cyanosis or edema Pulses: 2+ and symmetric Skin: Skin color, texture, turgor normal. No rashes or lesions Neurologic: Grossly normal  EKG normal sinus rhythm at 70 with nonspecific ST and T-wave changes. I personally reviewed this EKG  ASSESSMENT AND PLAN:   Hypertension History of hypertension with pressure measurement today at 138/82. She is on hydrochlorothiazide. Continue current meds at current dosing  HYPERLIPIDEMIA History of hyperlipidemia on pravastatin previously she no longer takes because of financial constraints. We  will recheck a lipid and liver profile. She does admit to dietary indiscretion  Tobacco abuse History of continued tobacco abuse recalcitrant to risk factor modification  Chest pain History of atypical chest pain with a normal cardiac Catheterization which I performed  07/06/04.      Lorretta Harp MD FACP,FACC,FAHA, [facility] 10/12/2014 10:16 AM

## 2014-10-12 NOTE — Assessment & Plan Note (Signed)
History of atypical chest pain with a normal cardiac Catheterization which I performed 07/06/04.

## 2014-10-12 NOTE — Assessment & Plan Note (Signed)
History of continued tobacco abuse recalcitrant to risk factor modification 

## 2014-10-12 NOTE — Assessment & Plan Note (Signed)
History of hypertension with pressure measurement today at 138/82. She is on hydrochlorothiazide. Continue current meds at current dosing

## 2014-10-12 NOTE — Assessment & Plan Note (Signed)
History of hyperlipidemia on pravastatin previously she no longer takes because of financial constraints. We will recheck a lipid and liver profile. She does admit to dietary indiscretion

## 2014-10-12 NOTE — Patient Instructions (Addendum)
Medication Instructions:  Your physician recommends that you continue on your current medications as directed. Please refer to the Current Medication list given to you today.   Labwork: Your physician recommends that you return for lab work in: FASTING (Lipids/Liver) The lab can be found on the FIRST FLOOR of out building in [address]   Testing/Procedures: NONE  Follow-Up: Your physician wants you to follow-up in: 12 months with Dr. Gwenlyn Found. You will receive a reminder letter in the mail two months in advance. If you don't receive a letter, please call our office to schedule the follow-up appointment.    Any Other Special Instructions Will Be Listed Below (If Applicable).

## 2014-10-13 LAB — HEPATIC FUNCTION PANEL
ALBUMIN: 4.3 g/dL (ref 3.6–5.1)
ALT: 25 U/L (ref 6–29)
AST: 18 U/L (ref 10–35)
Alkaline Phosphatase: 108 U/L (ref 33–130)
Bilirubin, Direct: 0.1 mg/dL (ref <=0.2)
Indirect Bilirubin: 0.4 mg/dL (ref 0.2–1.2)
Total Bilirubin: 0.5 mg/dL (ref 0.2–1.2)
Total Protein: 6.9 g/dL (ref 6.1–8.1)

## 2014-10-13 LAB — LIPID PANEL
CHOLESTEROL: 224 mg/dL — AB (ref 125–200)
HDL: 75 mg/dL (ref >=46)
LDL Cholesterol: 136 mg/dL — ABNORMAL HIGH (ref <130)
Total CHOL/HDL Ratio: 3 Ratio (ref <=5.0)
Triglycerides: 66 mg/dL (ref <150)
VLDL: 13 mg/dL (ref <30)

## 2014-10-18 ENCOUNTER — Telehealth: Payer: Self-pay

## 2014-10-18 MED ORDER — COENZYME Q10 30 MG PO CAPS: ORAL_CAPSULE | ORAL | Status: DC

## 2014-10-18 MED ORDER — ATORVASTATIN CALCIUM 20 MG PO TABS: 20.0000 mg | ORAL_TABLET | Freq: Every day | ORAL | Status: DC

## 2014-10-18 NOTE — Telephone Encounter (Signed)
-----   Message from Lorretta Harp, MD sent at 10/14/2014 11:35 AM EDT ----- Start atorvastatin 20 mg a day and recheck in 2 months. Also start Co Q 10 200 mg

## 2014-10-18 NOTE — Telephone Encounter (Signed)
Orders for medication sent to pharmacy, [address].

## 2014-10-19 ENCOUNTER — Telehealth: Payer: Self-pay | Admitting: *Deleted

## 2014-10-20 NOTE — Telephone Encounter (Signed)
Spoke with pt she is going to come by [employer] to pick up coupons for medication.  Coupons printed for 30 days and 90 days to help with cost from [facility].  Pt agrees to give it a try and will call our office if she has any other problems.

## 2014-10-25 NOTE — Progress Notes (Signed)
Patient ID: Gabriela [name], female   DOB: 02-17-1960, 54 y.o.   MRN: [id number]   [facility] Aquilla Hacker, MD Phone: [phone]  Subjective:   # Follow Up HTN - Pt. Here for routine follow up - no concerns at this time.  - blood pressure well controlled on current regimen.  - no chest pain, palpitations, lower extremity edema, or SOB - no issues with meds.   Otherwise doing well with no complaints.   All relevant systems were reviewed and were negative unless otherwise noted in the HPI  Past Medical History Reviewed problem list.  Medications- reviewed and updated Current Outpatient Prescriptions  Medication Sig Dispense Refill  . atorvastatin (LIPITOR) 20 MG tablet Take 1 tablet (20 mg total) by mouth daily. 90 tablet 3  . Black Cohosh (BLACK COHOSH HOT FLASH RELIEF) 40 MG CAPS Take 80 mg by mouth daily.    . cholecalciferol (VITAMIN D) 1000 UNITS tablet Take 1,000 Units by mouth daily.    Marland Kitchen co-enzyme Q-10 30 MG capsule Take 200 mg by mouth daily    . hydrochlorothiazide (HYDRODIURIL) 25 MG tablet Take 1 tablet (25 mg total) by mouth daily. 30 tablet 3  . hydrOXYzine (ATARAX/VISTARIL) 10 MG tablet Take 1 tablet (10 mg total) by mouth 3 (three) times daily as needed for itching. 30 tablet 0  . zaleplon (SONATA) 5 MG capsule Take 1 capsule (5 mg total) by mouth at bedtime as needed for sleep. 30 capsule 0   No current facility-administered medications for this visit.   Chief complaint-noted No additions to family history Social history- patient is a former smoker  Objective: BP 126/79 mmHg  Pulse 94  Temp(Src) 97.9 F (36.6 C) (Oral)  Ht 5\' 7"  (1.702 m)  Wt 206 lb 1 oz (93.469 kg)  BMI 32.27 kg/m2 Gen: NAD, alert, cooperative with exam HEENT: NCAT, EOMI, PERRL Neck: FROM, supple CV: RRR, good S1/S2, no murmur Resp: CTABL, no wheezes, non-labored Abd: SNTND, BS present, no guarding or organomegaly Ext: No edema, warm, normal tone, moves  UE/LE spontaneously Neuro: Alert and oriented, No gross deficits Skin: no rashes no lesions  Assessment/Plan:  HTN: well controlled on current regimen, no warning signs / symptoms at this time.  - continue current regimen - creatinine appropriate.  - follow up in 6 months.

## 2014-11-16 ENCOUNTER — Other Ambulatory Visit: Payer: Self-pay | Admitting: Family Medicine

## 2014-11-16 MED ORDER — HYDROXYZINE HCL 10 MG PO TABS: 10.0000 mg | ORAL_TABLET | Freq: Three times a day (TID) | ORAL | Status: DC | PRN

## 2014-11-16 NOTE — Telephone Encounter (Signed)
Need refill on her hydroxyzine.  Send to [facility] on [address]

## 2014-11-23 ENCOUNTER — Ambulatory Visit: Payer: PRIVATE HEALTH INSURANCE | Admitting: Cardiovascular Disease

## 2014-12-20 ENCOUNTER — Telehealth: Payer: Self-pay | Admitting: Family Medicine

## 2014-12-20 NOTE — Telephone Encounter (Signed)
Pt called and would like a referral to go to the breast center. jw

## 2014-12-20 NOTE — Telephone Encounter (Signed)
Will forward to PCP for review of referral to breast center. Aarini Slee, CMA.

## 2014-12-21 NOTE — Telephone Encounter (Signed)
Is having problems with her breast and needs the referral sent asap

## 2014-12-21 NOTE — Telephone Encounter (Signed)
Will forward to MD.  Patient may need to come in and be evaluated first.  [facility]

## 2014-12-23 ENCOUNTER — Other Ambulatory Visit: Payer: Self-pay | Admitting: Family Medicine

## 2014-12-23 NOTE — Telephone Encounter (Signed)
Could we clarify why she needs a referral to the breast center? I can't find any reason in the chart to make this referral at this time. If it is unclear, have her come in to the office for a visit for evaluation. Thanks   CGM MD

## 2014-12-23 NOTE — Telephone Encounter (Signed)
Pt called and needs a refill on her BP medication. She is out. Gabriela [name]

## 2014-12-23 NOTE — Telephone Encounter (Signed)
LM for patient to call back.  Please assist patient in making an appt to have her breast concerns evaluated and then we can make a referral for the breast center and what imaging she would need based off that appt. Thanks [employer]

## 2014-12-24 MED ORDER — HYDROCHLOROTHIAZIDE 25 MG PO TABS: 25.0000 mg | ORAL_TABLET | Freq: Every day | ORAL | Status: DC

## 2014-12-29 ENCOUNTER — Ambulatory Visit: Payer: PRIVATE HEALTH INSURANCE | Admitting: Family Medicine

## 2015-01-05 ENCOUNTER — Encounter: Payer: Self-pay | Admitting: Family Medicine

## 2015-01-05 ENCOUNTER — Ambulatory Visit (INDEPENDENT_AMBULATORY_CARE_PROVIDER_SITE_OTHER): Payer: 59 | Admitting: Family Medicine

## 2015-01-05 VITALS — BP 139/76 | HR 92 | Temp 98.2°F | Wt 204.3 lb

## 2015-01-05 DIAGNOSIS — N644 Mastodynia: Secondary | ICD-10-CM | POA: Diagnosis not present

## 2015-01-05 DIAGNOSIS — B351 Tinea unguium: Secondary | ICD-10-CM

## 2015-01-05 MED ORDER — TERBINAFINE HCL 250 MG PO TABS: 250.0000 mg | ORAL_TABLET | Freq: Every day | ORAL | Status: DC

## 2015-01-05 NOTE — Patient Instructions (Addendum)
Thanks for letting us take care of you.   You should call to schedule your mammogram when possible. Be sure to schedule a 3D mammogram.   You will need a mammogram yearly.   I have called in Terbinafine for you to take for your nail fungus. It is unfurtunately, a 12 week course of medication. You will take this daily. It will help to remove the fungus from your nails.   Return in 6 months for follow up of your blood pressure and to make sure things are going ok  Thanks for letting us take care of you.  Sincerely,  Paula Compton, MD Family Medicine - PGY 2

## 2015-01-07 ENCOUNTER — Telehealth: Payer: Self-pay | Admitting: Family Medicine

## 2015-01-07 NOTE — Telephone Encounter (Signed)
Pt called and needs a referral to the breast center. jw

## 2015-01-10 ENCOUNTER — Other Ambulatory Visit: Payer: Self-pay | Admitting: Gynecology

## 2015-01-10 ENCOUNTER — Other Ambulatory Visit: Payer: Self-pay

## 2015-01-10 DIAGNOSIS — N644 Mastodynia: Secondary | ICD-10-CM

## 2015-01-10 NOTE — Telephone Encounter (Signed)
Will forward to MD to place referral for patient's mammogram. Constance Hackenberg,CMA

## 2015-01-12 ENCOUNTER — Ambulatory Visit: Payer: PRIVATE HEALTH INSURANCE | Admitting: Family Medicine

## 2015-01-13 ENCOUNTER — Encounter (HOSPITAL_COMMUNITY): Payer: Self-pay | Admitting: Emergency Medicine

## 2015-01-13 ENCOUNTER — Emergency Department (INDEPENDENT_AMBULATORY_CARE_PROVIDER_SITE_OTHER)
Admission: EM | Admit: 2015-01-13 | Discharge: 2015-01-13 | Disposition: A | Discharge status: Home / Self Care | Payer: 59 | Source: Home / Self Care | Attending: Family Medicine | Admitting: Family Medicine

## 2015-01-13 DIAGNOSIS — J069 Acute upper respiratory infection, unspecified: Secondary | ICD-10-CM | POA: Diagnosis not present

## 2015-01-13 MED ORDER — GUAIFENESIN ER 600 MG PO TB12: 1200.0000 mg | ORAL_TABLET | Freq: Two times a day (BID) | ORAL | Status: DC

## 2015-01-13 NOTE — Discharge Instructions (Signed)
It was a pleasure to see you today.   For the symptoms of your upper respiratory infection, I recommend the following:  1. Guaifenesin 600mg  tablets, 1 to 2 tablets by mouth every 12 hours as needed to thin mucus/secretions.  2. Acetaminophen 500mg  tablets, take 1-2 tablets by mouth every 6 hours as needed for pains and aches.   3. Vaporizer/steam for symptoms.   If you develop fevers/chills, worsening cough/facial pain, or other symptoms, please follow up with your primary doctor or the urgent care center.

## 2015-01-13 NOTE — ED Notes (Signed)
C/o cold sx onset x3 days associated w/HA, congestion, runny nose, BA, prod cough Taking OTC cold meds w/no relief Denies fevers A&O x4... No acute distress.

## 2015-01-13 NOTE — ED Provider Notes (Signed)
CSN: [id number]     Arrival date & time 01/13/15  1813 History   First MD Initiated Contact with Patient 01/13/15 1843     Chief Complaint  Patient presents with  . URI   (Consider location/radiation/quality/duration/timing/severity/associated sxs/prior Treatment) Patient is a 54 y.o. female presenting with URI. The history is provided by the patient. No language interpreter was used.  URI Presenting symptoms: congestion, cough and fatigue   Presenting symptoms: no ear pain, no fever and no sore throat   Patient presents with complaint of chills, aches and dry cough that started on 12/20. Has also had a mild L sided headache, without photophobia.  No measured fevers. Taking Tylenol 325mg  with minimal relief.  Has had nasal congestion as well. Several co-workers sick at [employer] where she works M-F.   Social Hx; Smokes cigarettes occasionally, none since becoming ill.   Past Medical History  Diagnosis Date  . Smoker     ONE PACK PER WEEK  . Anemia   . Hypertension   . Benign colon polyp 08/2005    adenomatous  . Dysmenorrhea   . Fibroid   . Chest pain   . Irregular heart beat   . Hyperlipidemia    Past Surgical History  Procedure Laterality Date  . 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 Umbilical hernia repair      AS A CHILD  . Abdominal hysterectomy  06/2001    Toney Rakes, M.D.  . Cardiac catheterization  2003    Dr Gwenlyn Found  . Transthoracic echocardiogram  07/03/04    TRACE MR,TRACE TR,TRACE PVR  . Cardiolite myocardial perfusion  06/07/03    NO PERFUSION ABNORMALITY. EF% 68%.  . Lower ext arterial  07/28/08    NORMAL   Family History  Problem Relation Age of Onset  . Heart disease Mother   . Hypertension Mother   . Stroke Father   . Colon cancer Neg Hx   . Stomach cancer Neg Hx    Social History  Substance Use Topics  . Smoking status: Current Every Day Smoker -- 0.10 packs/day    Last Attempt to Quit: 07/25/2013  . Smokeless tobacco: Never Used      Comment: only smokes 1 -2 cigarettes daily.  . Alcohol Use: 1.2 oz/week    2 Glasses of wine per week   OB History    Gravida Para Term Preterm AB TAB SAB Ectopic Multiple Living   3 1   2     1      Review of Systems  Constitutional: Positive for chills and fatigue. Negative for fever and diaphoresis.  HENT: Positive for congestion and postnasal drip. Negative for ear discharge, ear pain, facial swelling, nosebleeds, sore throat and trouble swallowing.   Respiratory: Positive for cough. Negative for chest tightness.   Cardiovascular: Negative for chest pain.  All other systems reviewed and are negative.   Allergies  Review of patient's allergies indicates no known allergies.  Home Medications   Prior to Admission medications   Medication Sig Start Date End Date Taking? Authorizing Provider  hydrochlorothiazide (HYDRODIURIL) 25 MG tablet Take 1 tablet (25 mg total) by mouth daily. 12/24/14  Yes Aquilla Hacker, MD  atorvastatin (LIPITOR) 20 MG tablet Take 1 tablet (20 mg total) by mouth daily. 10/18/14   Lorretta Harp, MD  Black Cohosh (BLACK COHOSH HOT FLASH RELIEF) 40 MG CAPS Take 80 mg by mouth daily.    Historical Provider, MD  cholecalciferol (VITAMIN D)  1000 UNITS tablet Take 1,000 Units by mouth daily.    Historical Provider, MD  co-enzyme Q-10 30 MG capsule Take 200 mg by mouth daily 10/18/14   Lorretta Harp, MD  guaiFENesin (MUCINEX) 600 MG 12 hr tablet Take 2 tablets (1,200 mg total) by mouth 2 (two) times daily. 01/13/15   Willeen Niece, MD  hydrOXYzine (ATARAX/VISTARIL) 10 MG tablet Take 1 tablet (10 mg total) by mouth 3 (three) times daily as needed for itching. 11/16/14   York Ram Melancon, MD  terbinafine (LAMISIL) 250 MG tablet Take 1 tablet (250 mg total) by mouth daily. 01/05/15   Aquilla Hacker, MD  zaleplon (SONATA) 5 MG capsule Take 1 capsule (5 mg total) by mouth at bedtime as needed for sleep. 09/21/14   Lorna Few, DO   Meds Ordered and  Administered this Visit  Medications - No data to display  BP 152/82 mmHg  Pulse 90  Temp(Src) 99 F (37.2 C) (Oral)  SpO2 100% No data found.   Physical Exam  Constitutional: She appears well-developed and well-nourished. No distress.  HENT:  Head: Normocephalic and atraumatic.  TMs normal, with evidence of serous fluid behind bilateral TMs.   Nasal mucosa boggy and with watery rhinorrhea.   Cobblestoning oropharynx. No exudates.   Eyes: Conjunctivae and EOM are normal. Pupils are equal, round, and reactive to light. Right eye exhibits no discharge. Left eye exhibits no discharge.  No photophobia with eye exam.   Neck: Neck supple.  Cardiovascular: Normal rate, regular rhythm and normal heart sounds.   Pulmonary/Chest: Effort normal and breath sounds normal. No respiratory distress. She has no wheezes. She has no rales. She exhibits no tenderness.  Lymphadenopathy:    She has no cervical adenopathy.  Skin: She is not diaphoretic.    ED Course  Procedures (including critical care time)  Labs Review Labs Reviewed - No data to display  Imaging Review No results found.   Visual Acuity Review  Right Eye Distance:   Left Eye Distance:   Bilateral Distance:    Right Eye Near:   Left Eye Near:    Bilateral Near:         MDM   1. Upper respiratory infection, viral    Patient with acute viral upper respiratory infection, supportive care and indications for return visit to her primary doctor or the [facility].     Willeen Niece, MD 01/13/15 [phone]

## 2015-01-14 ENCOUNTER — Ambulatory Visit
Admission: RE | Admit: 2015-01-14 | Discharge: 2015-01-14 | Disposition: A | Discharge status: Home / Self Care | Payer: 59 | Source: Ambulatory Visit | Attending: Gynecology | Admitting: Gynecology

## 2015-01-14 DIAGNOSIS — N644 Mastodynia: Secondary | ICD-10-CM

## 2015-01-18 NOTE — Telephone Encounter (Signed)
Done in the office.   CGM MD

## 2015-01-18 NOTE — Progress Notes (Signed)
Patient ID: Gabriela [name], female   DOB: 11/25/1960, 54 y.o.   MRN: [id number]   [facility] Aquilla Hacker, MD Phone: [phone]  Subjective:   # Mammogram referral - pt. Here for mammogram referral.  - She says that she has had some pain in her left breast.  - no frank lumps palpated on self-exam - no weight change, malaise, fatigue, or decreased appetite.  - pt. Has had regular mammograms which have been negative to date.  - no breast discharge, no assymetry.  - no skin changes.  - no family history of early breast cancer.  - pt. Desires referral for 3 d mammogram.   All relevant systems were reviewed and were negative unless otherwise noted in the HPI  Past Medical History Reviewed problem list.  Medications- reviewed and updated Current Outpatient Prescriptions  Medication Sig Dispense Refill  . atorvastatin (LIPITOR) 20 MG tablet Take 1 tablet (20 mg total) by mouth daily. 90 tablet 3  . Black Cohosh (BLACK COHOSH HOT FLASH RELIEF) 40 MG CAPS Take 80 mg by mouth daily.    . cholecalciferol (VITAMIN D) 1000 UNITS tablet Take 1,000 Units by mouth daily.    Marland Kitchen co-enzyme Q-10 30 MG capsule Take 200 mg by mouth daily    . guaiFENesin (MUCINEX) 600 MG 12 hr tablet Take 2 tablets (1,200 mg total) by mouth 2 (two) times daily. 20 tablet 0  . hydrochlorothiazide (HYDRODIURIL) 25 MG tablet Take 1 tablet (25 mg total) by mouth daily. 30 tablet 3  . hydrOXYzine (ATARAX/VISTARIL) 10 MG tablet Take 1 tablet (10 mg total) by mouth 3 (three) times daily as needed for itching. 30 tablet 0  . terbinafine (LAMISIL) 250 MG tablet Take 1 tablet (250 mg total) by mouth daily. 84 tablet 0  . zaleplon (SONATA) 5 MG capsule Take 1 capsule (5 mg total) by mouth at bedtime as needed for sleep. 30 capsule 0   No current facility-administered medications for this visit.   Chief complaint-noted No additions to family history Social history- patient is a non  smoker  Objective: BP 139/76 mmHg  Pulse 92  Temp(Src) 98.2 F (36.8 C) (Oral)  Wt 204 lb 4.8 oz (92.67 kg) Gen: NAD, alert, cooperative with exam HEENT: NCAT, EOMI, PERRL, TMs nml Neck: FROM, supple CV: RRR, good S1/S2, no murmur Resp: CTABL, no wheezes, non-labored Abd: SNTND, BS present, no guarding or organomegaly GYN: clinical breast exam performed, no palpable masses on either side, no assymetry, no lymphadenopathy in either axilla, no skin changes, breasts are rather dense.  Ext: No edema, warm, normal tone, moves UE/LE spontaneously, onychomycosis of left large toe.  Neuro: Alert and oriented, No gross deficits Skin: no rashes no lesions  Assessment/Plan:  Mammogram - pt. Requires referral for mammogram due to insurance.  - will set her up for 3 d mammogram given her pain and breast density.  - pt. Without warning signs / symptoms at this time.  - she will follow up after getting evaluation.  - s/p menopause.   Onychomycosis  - terbinafine tx.

## 2015-02-01 ENCOUNTER — Ambulatory Visit (INDEPENDENT_AMBULATORY_CARE_PROVIDER_SITE_OTHER): Payer: BLUE CROSS/BLUE SHIELD | Admitting: Student

## 2015-02-01 ENCOUNTER — Encounter: Payer: Self-pay | Admitting: Student

## 2015-02-01 VITALS — BP 139/83 | HR 74 | Temp 98.4°F | Wt 204.9 lb

## 2015-02-01 DIAGNOSIS — J069 Acute upper respiratory infection, unspecified: Secondary | ICD-10-CM

## 2015-02-01 MED ORDER — CETIRIZINE HCL 5 MG/5ML PO SYRP: 5.0000 mg | ORAL_SOLUTION | Freq: Every day | ORAL | Status: DC

## 2015-02-01 NOTE — Patient Instructions (Signed)
Follow up in 2 weeks with PCP for cold symptoms If you have questions or concerns, call the office at [phone]

## 2015-02-01 NOTE — Progress Notes (Signed)
   Subjective:    Patient ID: Gabriela [name], female    DOB: 27-Sep-1960, 55 y.o.   MRN: [id number]   CC: congestion/post nasal drip  HPI: 55 y/o presenting for continued congestion and post nasal drip  Congestion/nasal drip - reports symptoms since 01/13/2015  - she has tried mucinex with little relief - denies fever, N/V/D - she has had sore throat 2 days ago which resolved - she has occasional headache, but none currently - she did have ear pain 3 days ago after putting a Q-tip in her ear but it has since resolved     Review of Systems ROS  Denies changes in vision, chest pain, SOB, weakness, does report some general fatigue  Past Medical, Surgical, Social, and Family History Reviewed & Updated per EMR.   Objective:  BP 139/83 mmHg  Pulse 74  Temp(Src) 98.4 F (36.9 C) (Oral)  Wt 204 lb 14.4 oz (92.942 kg) Vitals and nursing note reviewed  General: NAD HEENT: normal TMs bilaterally, normal oropharynx, no lymphadenopathy Cardiac: RRR Respiratory: CTAB, normal effort Skin: warm and dry, no rashes noted Neuro: alert and oriented, no focal deficits   Assessment & Plan:    No problem-specific assessment & plan notes found for this encounter.    Gabriela [name] A. Lincoln Brigham MD, [facility] Resident PGY-2 Pager [phone]

## 2015-02-01 NOTE — Assessment & Plan Note (Signed)
History and physical exam consistent with viral URI. She does have a history of allergic rhinitis but does not take anything for this and it is likely contributing to her current symptoms - Will start cetirizine  - otherwise continue conservative therapy - return precautions reviewed

## 2015-02-03 ENCOUNTER — Encounter (HOSPITAL_COMMUNITY): Payer: Self-pay

## 2015-02-03 DIAGNOSIS — I10 Essential (primary) hypertension: Secondary | ICD-10-CM | POA: Diagnosis not present

## 2015-02-03 DIAGNOSIS — F17210 Nicotine dependence, cigarettes, uncomplicated: Secondary | ICD-10-CM | POA: Diagnosis not present

## 2015-02-03 DIAGNOSIS — Z9889 Other specified postprocedural states: Secondary | ICD-10-CM | POA: Diagnosis not present

## 2015-02-03 DIAGNOSIS — Z862 Personal history of diseases of the blood and blood-forming organs and certain disorders involving the immune mechanism: Secondary | ICD-10-CM | POA: Diagnosis not present

## 2015-02-03 DIAGNOSIS — Z79899 Other long term (current) drug therapy: Secondary | ICD-10-CM | POA: Diagnosis not present

## 2015-02-03 DIAGNOSIS — E785 Hyperlipidemia, unspecified: Secondary | ICD-10-CM | POA: Insufficient documentation

## 2015-02-03 DIAGNOSIS — Z86018 Personal history of other benign neoplasm: Secondary | ICD-10-CM | POA: Diagnosis not present

## 2015-02-03 DIAGNOSIS — M79604 Pain in right leg: Secondary | ICD-10-CM | POA: Diagnosis present

## 2015-02-03 DIAGNOSIS — Z86010 Personal history of colonic polyps: Secondary | ICD-10-CM | POA: Diagnosis not present

## 2015-02-03 DIAGNOSIS — Z8742 Personal history of other diseases of the female genital tract: Secondary | ICD-10-CM | POA: Insufficient documentation

## 2015-02-03 MED ORDER — OXYCODONE-ACETAMINOPHEN 5-325 MG PO TABS
1.0000 | ORAL_TABLET | Freq: Once | ORAL | Status: AC
  Administered 2015-02-03: 1 via ORAL

## 2015-02-03 MED ORDER — OXYCODONE-ACETAMINOPHEN 5-325 MG PO TABS
ORAL_TABLET | ORAL | Status: AC
  Filled 2015-02-03: qty 1

## 2015-02-03 NOTE — ED Notes (Signed)
Pt here for pain to her right leg started Tuesday morning. Wondering if she has poor circulation to the legs. Wants to make sure there isn't a blood clot. Denies any SOB or long trips.

## 2015-02-04 ENCOUNTER — Emergency Department (HOSPITAL_COMMUNITY)
Admission: EM | Admit: 2015-02-04 | Discharge: 2015-02-04 | Disposition: A | Discharge status: Home / Self Care | Payer: BLUE CROSS/BLUE SHIELD | Attending: Emergency Medicine | Admitting: Emergency Medicine

## 2015-02-04 DIAGNOSIS — M79604 Pain in right leg: Secondary | ICD-10-CM

## 2015-02-04 LAB — I-STAT CHEM 8, ED
BUN: 15 mg/dL (ref 6–20)
CHLORIDE: 101 mmol/L (ref 101–111)
CREATININE: 0.8 mg/dL (ref 0.44–1.00)
Calcium, Ion: 1.28 mmol/L — ABNORMAL HIGH (ref 1.12–1.23)
GLUCOSE: 139 mg/dL — AB (ref 65–99)
HCT: 41 % (ref 36.0–46.0)
Hemoglobin: 13.9 g/dL (ref 12.0–15.0)
POTASSIUM: 3.3 mmol/L — AB (ref 3.5–5.1)
Sodium: 143 mmol/L (ref 135–145)
TCO2: 29 mmol/L (ref 0–100)

## 2015-02-04 LAB — CBC WITH DIFFERENTIAL/PLATELET
BASOS ABS: 0 10*3/uL (ref 0.0–0.1)
Basophils Relative: 0 %
Eosinophils Absolute: 0.3 10*3/uL (ref 0.0–0.7)
Eosinophils Relative: 3 %
HEMATOCRIT: 41.2 % (ref 36.0–46.0)
Hemoglobin: 12.9 g/dL (ref 12.0–15.0)
LYMPHS ABS: 3.8 10*3/uL (ref 0.7–4.0)
LYMPHS PCT: 44 %
MCH: 27.2 pg (ref 26.0–34.0)
MCHC: 31.3 g/dL (ref 30.0–36.0)
MCV: 86.7 fL (ref 78.0–100.0)
MONO ABS: 0.6 10*3/uL (ref 0.1–1.0)
Monocytes Relative: 7 %
NEUTROS ABS: 4 10*3/uL (ref 1.7–7.7)
Neutrophils Relative %: 46 %
Platelets: 351 10*3/uL (ref 150–400)
RBC: 4.75 MIL/uL (ref 3.87–5.11)
RDW: 13.8 % (ref 11.5–15.5)
WBC: 8.7 10*3/uL (ref 4.0–10.5)

## 2015-02-04 LAB — D-DIMER, QUANTITATIVE: D-Dimer, Quant: <0.27 ug/mL-FEU (ref 0.00–0.50)

## 2015-02-04 MED ORDER — IBUPROFEN 800 MG PO TABS: 800.0000 mg | ORAL_TABLET | Freq: Three times a day (TID) | ORAL | Status: DC

## 2015-02-04 NOTE — ED Provider Notes (Signed)
CSN: [id number]     Arrival date & time 02/03/15  2313 History   First MD Initiated Contact with Patient 02/04/15 [phone]     Chief Complaint  Patient presents with  . Leg Pain   HPI  Gabriela [name] is a 55 y.o. F PMH significant for HTN, HLD presenting with right leg pain since Tuesday morning. She states the pain has improved since then but she has been concerned about a blood clot. She describes her pain as 7/10 pain scale, intermittent, sharp, non-radiating, popliteal in location. She denies fevers, chills, CP, SOB, palpitations, history of blood clots.   Past Medical History  Diagnosis Date  . Smoker     ONE PACK PER WEEK  . Anemia   . Hypertension   . Benign colon polyp 08/2005    adenomatous  . Dysmenorrhea   . Fibroid   . Chest pain   . Irregular heart beat   . Hyperlipidemia    Past Surgical History  Procedure Laterality Date  . 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 Umbilical hernia repair      AS A CHILD  . Abdominal hysterectomy  06/2001    Toney Rakes, M.D.  . Cardiac catheterization  2003    Dr Gwenlyn Found  . Transthoracic echocardiogram  07/03/04    TRACE MR,TRACE TR,TRACE PVR  . Cardiolite myocardial perfusion  06/07/03    NO PERFUSION ABNORMALITY. EF% 68%.  . Lower ext arterial  07/28/08    NORMAL   Family History  Problem Relation Age of Onset  . Heart disease Mother   . Hypertension Mother   . Stroke Father   . Colon cancer Neg Hx   . Stomach cancer Neg Hx    Social History  Substance Use Topics  . Smoking status: Current Every Day Smoker -- 0.10 packs/day    Last Attempt to Quit: 07/25/2013  . Smokeless tobacco: Never Used     Comment: only smokes 1 -2 cigarettes daily.  . Alcohol Use: 1.2 oz/week    2 Glasses of wine per week   OB History    Gravida Para Term Preterm AB TAB SAB Ectopic Multiple Living   3 1   2     1      Review of Systems  Ten systems are reviewed and are negative for acute change except as noted in the HPI  Allergies   Review of patient's allergies indicates no known allergies.  Home Medications   Prior to Admission medications   Medication Sig Start Date End Date Taking? Authorizing Provider  atorvastatin (LIPITOR) 20 MG tablet Take 1 tablet (20 mg total) by mouth daily. 10/18/14   Lorretta Harp, MD  Black Cohosh (BLACK COHOSH HOT FLASH RELIEF) 40 MG CAPS Take 80 mg by mouth daily.    Historical Provider, MD  cetirizine HCl (ZYRTEC) 5 MG/5ML SYRP Take 5 mLs (5 mg total) by mouth daily. 02/01/15   Veatrice Bourbon, MD  cholecalciferol (VITAMIN D) 1000 UNITS tablet Take 1,000 Units by mouth daily.    Historical Provider, MD  co-enzyme Q-10 30 MG capsule Take 200 mg by mouth daily 10/18/14   Lorretta Harp, MD  guaiFENesin (MUCINEX) 600 MG 12 hr tablet Take 2 tablets (1,200 mg total) by mouth 2 (two) times daily. 01/13/15   Willeen Niece, MD  hydrochlorothiazide (HYDRODIURIL) 25 MG tablet Take 1 tablet (25 mg total) by mouth daily. 12/24/14   Aquilla Hacker, MD  hydrOXYzine (ATARAX/VISTARIL)  10 MG tablet Take 1 tablet (10 mg total) by mouth 3 (three) times daily as needed for itching. 11/16/14   York Ram Melancon, MD  terbinafine (LAMISIL) 250 MG tablet Take 1 tablet (250 mg total) by mouth daily. 01/05/15   Aquilla Hacker, MD  zaleplon (SONATA) 5 MG capsule Take 1 capsule (5 mg total) by mouth at bedtime as needed for sleep. 09/21/14   Halsey N Rumley, DO   BP 134/66 mmHg  Pulse 60  Temp(Src) 97.3 F (36.3 C) (Oral)  Resp 16  Ht 5\' 6"  (1.676 m)  Wt 94.756 kg  BMI 33.73 kg/m2  SpO2 100% Physical Exam  Constitutional: She appears well-developed and well-nourished. No distress.  HENT:  Head: Normocephalic and atraumatic.  Eyes: Conjunctivae are normal. Right eye exhibits no discharge. Left eye exhibits no discharge. No scleral icterus.  Neck: No tracheal deviation present.  Cardiovascular: Normal rate, regular rhythm, normal heart sounds and intact distal pulses.  Exam reveals no gallop and no  friction rub.   No murmur heard. Pulmonary/Chest: Effort normal and breath sounds normal. No respiratory distress. She has no wheezes. She has no rales. She exhibits no tenderness.  Abdominal: Soft. Bowel sounds are normal. She exhibits no distension and no mass. There is no tenderness. There is no rebound and no guarding.  Musculoskeletal: Normal range of motion. She exhibits no edema or tenderness.  Neurovascularly intact BL  Lymphadenopathy:    She has no cervical adenopathy.  Neurological: She is alert. Coordination normal.  Skin: Skin is warm and dry. No rash noted. She is not diaphoretic. No erythema.  Psychiatric: She has a normal mood and affect. Her behavior is normal.  Nursing note and vitals reviewed.  ED Course  Procedures  Labs Review Labs Reviewed  I-STAT CHEM 8, ED - Abnormal; Notable for the following:    Potassium 3.3 (*)    Glucose, Bld 139 (*)    Calcium, Ion 1.28 (*)    All other components within normal limits  CBC WITH DIFFERENTIAL/PLATELET  D-DIMER, QUANTITATIVE (NOT AT [facility])   MDM   Final diagnoses:  Pain of right lower extremity   Patient non-toxic appearing. Hypertensive on exam. Based on patient history and physical exam, most likely etiologies are MSK vs DVT. Less likely etiologies are fracture, septic joint.  Hypokalemia of 3.3, hyperglycemia of 139, ca of 1.28. CBC and d-dimer unremarkable.  Conservative therapy discussed. Patient may be safely discharged home. Discussed reasons for return. Patient to follow-up with primary care provider within one week. Patient in understanding and agreement with the plan.  Merom Lions, PA-C 02/06/15 [id number]  April Palumbo, MD 02/07/15 0010

## 2015-02-04 NOTE — Discharge Instructions (Signed)
Ms. Gabriela [name],  Nice meeting you! Please follow-up with your primary care provider. Return to the emergency department if you notice color changes in your legs, develop chest pain, shortness of breath. Feel better soon!  S. Wendie Simmer, PA-C Musculoskeletal Pain Musculoskeletal pain is muscle and boney aches and pains. These pains can occur in any part of the body. Your caregiver may treat you without knowing the cause of the pain. They may treat you if blood or urine tests, X-rays, and other tests were normal.  CAUSES There is often not a definite cause or reason for these pains. These pains may be caused by a type of germ (virus). The discomfort may also come from overuse. Overuse includes working out too hard when your body is not fit. Boney aches also come from weather changes. Bone is sensitive to atmospheric pressure changes. HOME CARE INSTRUCTIONS   Ask when your test results will be ready. Make sure you get your test results.  Only take over-the-counter or prescription medicines for pain, discomfort, or fever as directed by your caregiver. If you were given medications for your condition, do not drive, operate machinery or power tools, or sign legal documents for 24 hours. Do not drink alcohol. Do not take sleeping pills or other medications that may interfere with treatment.  Continue all activities unless the activities cause more pain. When the pain lessens, slowly resume normal activities. Gradually increase the intensity and duration of the activities or exercise.  During periods of severe pain, bed rest may be helpful. Lay or sit in any position that is comfortable.  Putting ice on the injured area.  Put ice in a bag.  Place a towel between your skin and the bag.  Leave the ice on for 15 to 20 minutes, 3 to 4 times a day.  Follow up with your caregiver for continued problems and no reason can be found for the pain. If the pain becomes worse or does not go away, it may  be necessary to repeat tests or do additional testing. Your caregiver may need to look further for a possible cause. SEEK IMMEDIATE MEDICAL CARE IF:  You have pain that is getting worse and is not relieved by medications.  You develop chest pain that is associated with shortness or breath, sweating, feeling sick to your stomach (nauseous), or throw up (vomit).  Your pain becomes localized to the abdomen.  You develop any new symptoms that seem different or that concern you. MAKE SURE YOU:   Understand these instructions.  Will watch your condition.  Will get help right away if you are not doing well or get worse.   This information is not intended to replace advice given to you by your health care provider. Make sure you discuss any questions you have with your health care provider.   Document Released: 01/08/2005 Document Revised: 04/02/2011 Document Reviewed: 09/12/2012 Elsevier Interactive Patient Education 2016 [place] [employer] therapy can help ease sore, stiff, injured, and tight muscles and joints. Heat relaxes your muscles, which may help ease your pain.  RISKS AND COMPLICATIONS If you have any of the following conditions, do not use heat therapy unless your health care provider has approved:  Poor circulation.  Healing wounds or scarred skin in the area being treated.  Diabetes, heart disease, or high blood pressure.  Not being able to feel (numbness) the area being treated.  Unusual swelling of the area being treated.  Active infections.  Blood  clots.  Cancer.  Inability to communicate pain. This may include young children and people who have problems with their brain function (dementia).  Pregnancy. Heat therapy should only be used on old, pre-existing, or long-lasting (chronic) injuries. Do not use heat therapy on new injuries unless directed by your health care provider. HOW TO USE HEAT THERAPY There are several different kinds of heat  therapy, including:  Moist heat pack.  Warm water bath.  Hot water bottle.  Electric heating pad.  Heated gel pack.  Heated wrap.  Electric heating pad. Use the heat therapy method suggested by your health care provider. Follow your health care provider's instructions on when and how to use heat therapy. GENERAL HEAT THERAPY RECOMMENDATIONS  Do not sleep while using heat therapy. Only use heat therapy while you are awake.  Your skin may turn pink while using heat therapy. Do not use heat therapy if your skin turns red.  Do not use heat therapy if you have new pain.  High heat or long exposure to heat can cause burns. Be careful when using heat therapy to avoid burning your skin.  Do not use heat therapy on areas of your skin that are already irritated, such as with a rash or sunburn. SEEK MEDICAL CARE IF:  You have blisters, redness, swelling, or numbness.  You have new pain.  Your pain is worse. MAKE SURE YOU:  Understand these instructions.  Will watch your condition.  Will get help right away if you are not doing well or get worse.   This information is not intended to replace advice given to you by your health care provider. Make sure you discuss any questions you have with your health care provider.   Document Released: 04/02/2011 Document Revised: 01/29/2014 Document Reviewed: 03/03/2013 Elsevier Interactive Patient Education [employer].

## 2015-03-31 ENCOUNTER — Ambulatory Visit (INDEPENDENT_AMBULATORY_CARE_PROVIDER_SITE_OTHER): Payer: BLUE CROSS/BLUE SHIELD | Admitting: Podiatry

## 2015-03-31 ENCOUNTER — Encounter: Payer: Self-pay | Admitting: Podiatry

## 2015-03-31 VITALS — BP 134/85 | HR 80 | Resp 12

## 2015-03-31 DIAGNOSIS — L603 Nail dystrophy: Secondary | ICD-10-CM | POA: Diagnosis not present

## 2015-03-31 DIAGNOSIS — Q828 Other specified congenital malformations of skin: Secondary | ICD-10-CM

## 2015-03-31 NOTE — Progress Notes (Signed)
   Subjective:    Patient ID: Gabriela [name], female    DOB: 1960/05/09, 55 y.o.   MRN: [id number]  HPI: She presents today as a 55 year old female with a chief complaint of discoloration and thickening to her hallux nails bilaterally. She states that her primary provider has recently placed her on Lamisil less than 1 month ago and she sees no results as of yet. She states that no tests were performed. She is also explained that her left hallux nail is thicker and discolored. The right hallux nail has just stopped growing completely area she denies trauma.    Review of Systems  Eyes: Positive for redness.  Skin: Positive for color change.       Objective:   Physical Exam: Vital signs are stable she is alert and oriented 3. distress. I have reviewed her past medical history medications allergies surgery social history and review of systems. Pulses are strongly palpable neurologic sensorium is intact versus once the monofilament. Deep tendon reflexes are intact. Muscle strength is 5 over 5 dorsiflexors plantar flexors and inverters everters onto the musculature is intact. Orthopedic evaluation demonstrates all joints distal to the ankle level fall range of motion without crepitation. She has a normal cutis bilateral with now dystrophies to the hallux nail and second nail plate right as well as the hallux nail left. There does appear to be some discoloration with subungual debris possible fungus. She also has multiple porokeratosis plantar aspect of the bilateral foot that are painful.        Assessment & Plan:  Assessment: Nail dystrophy will possible onychomycosis. Porokeratosis.  Plan: Debridement of porokeratotic lesions plantar aspect of the bilateral foot. I also took a sample of the toenails to be sent for pathologic evaluation. Currently she is already taking Lamisil and should her biopsy come back with a dermatophyte Lamisil is susceptible to it we should just leave her on the Lamisil  prescribed by her primary care provider. Otherwise we will discuss her options with her.

## 2015-04-21 ENCOUNTER — Telehealth: Payer: Self-pay | Admitting: *Deleted

## 2015-04-21 NOTE — Telephone Encounter (Addendum)
Dr. Milinda Pointer reviewed 03/31/2015 fungal culture as negative, states no treatment need.  Left message requesting pt call for results.  04/22/2015-Pt returned call for results, informed the culture was negative for fungus no treatment needed.

## 2015-04-22 ENCOUNTER — Telehealth: Payer: Self-pay | Admitting: *Deleted

## 2015-04-22 NOTE — Telephone Encounter (Signed)
Informed pt fungal culture was negative.

## 2015-04-27 ENCOUNTER — Ambulatory Visit: Payer: BLUE CROSS/BLUE SHIELD | Admitting: Obstetrics and Gynecology

## 2015-04-29 ENCOUNTER — Encounter: Payer: Self-pay | Admitting: Podiatry

## 2015-05-05 ENCOUNTER — Ambulatory Visit (INDEPENDENT_AMBULATORY_CARE_PROVIDER_SITE_OTHER): Payer: BLUE CROSS/BLUE SHIELD | Admitting: Family Medicine

## 2015-05-05 ENCOUNTER — Encounter: Payer: Self-pay | Admitting: Family Medicine

## 2015-05-05 VITALS — BP 146/92 | HR 58 | Temp 97.6°F | Wt 207.3 lb

## 2015-05-05 DIAGNOSIS — R5382 Chronic fatigue, unspecified: Secondary | ICD-10-CM

## 2015-05-05 DIAGNOSIS — Z86010 Personal history of colonic polyps: Secondary | ICD-10-CM | POA: Diagnosis not present

## 2015-05-05 DIAGNOSIS — R1012 Left upper quadrant pain: Secondary | ICD-10-CM | POA: Diagnosis not present

## 2015-05-05 DIAGNOSIS — R5383 Other fatigue: Secondary | ICD-10-CM | POA: Insufficient documentation

## 2015-05-05 DIAGNOSIS — K5900 Constipation, unspecified: Secondary | ICD-10-CM | POA: Diagnosis not present

## 2015-05-05 MED ORDER — POLYETHYLENE GLYCOL 3350 17 GM/SCOOP PO POWD: 17.0000 g | Freq: Every day | ORAL | Status: DC | PRN

## 2015-05-05 MED ORDER — DICYCLOMINE HCL 10 MG PO CAPS
10.0000 mg | ORAL_CAPSULE | Freq: Three times a day (TID) | ORAL | Status: DC
Start: 2015-05-05 — End: 2015-07-07

## 2015-05-05 NOTE — Assessment & Plan Note (Signed)
Suspect underlying constipation and bowel spasms with possible prior history of IBS per chart. Seems functional, no GI red flag symptoms, benign abdomen today (actually pain free), not suggestive of other acute abdominal etiology (unlikely PUD, cholelithiasis, pancreatitis, appendicitis), unlikely GI malignancy (prior adenomatous polyp, last colonoscopy 2013, due 04/2016)  Plan: 1. Start Miralax 17g daily for few days, titrate as advised 2. Routine constipation care, improve hydration, fiber, activity 3. Given rx Dicyclomine 10mg  PRN meals and QHS, may try for additional relief 4. No further labs or imaging today 5. Follow-up 4 weeks, see progress, consider referral to GI per patient request at that point if needed (may want to switch from [facility] to [place] by patient's preference), more emergent return criteria given

## 2015-05-05 NOTE — Assessment & Plan Note (Signed)
Likely multifactorial, but probably related to new job with variable shifts 2nd/3rd. Currently functioning regular and working. No red flag symptoms. No evidence anemia, hypothyroid, metabolic problems on last labs within past 1 year.  Plan: 1. Reassurance, briefly reviewed basic sleep hygiene 2. Follow-up as needed, anticipate may need to consider changing job shifts in future if can

## 2015-05-05 NOTE — Assessment & Plan Note (Signed)
Unlikely related to current abdominal pain / symptoms. Advised follow-up colonoscopy 04/2016

## 2015-05-05 NOTE — Patient Instructions (Signed)
Thank you for coming in to clinic today.  1. Your abdominal pain seems to be more functional, in the sense that I do not think there is an acute serious problem causing your pain, it does not seem consistent with kidney stone, urine infection, pancreatitis, appendicitis, gall bladder problem. - Most likely it is related to constipation and also could be IBS with some spasms - Try miralax powder 17g (1 capful) daily for about 3 days, goal is for at 1-2 soft stools daily, if not reaching this can increase after few days to 17g twice daily (morning and night, or x 2 at once in morning), may continue miralax for a few weeks if helping, then taper off - increase fluid intake and hydration to help constipation - Stop taking over the counter stool softener at the moment - Also sent Dicyclomine (Bentyl) for bowel spasms, maybe causing the pain, you can take this with meals and bedtime (may try if the  miralax is not resolving the pain), do not take with benadryl, hydroxyzine  If your abdominal pain does not improve, worsens, becomes more frequent, get nausea, vomiting, fevers or new concerns, rectal bleeding, then call us and we will refer you to [facility] (if you prefer to change), due next colonoscopy in 04/2016  2. Fatigue, most likely from your current job and shift work changing schedule, should gradually improve, some people never adjust to the late shift schedule.  No labs today, next due general screening labs at next Annual Physical in 09/2015  Please schedule a follow-up appointment with Dr Minda Ditto (or new primary doctor after 07/2015) for Abdominal Pain Follow-up in 4 weeks  If you have any other questions or concerns, please feel free to call the clinic to contact me. You may also schedule an earlier appointment if necessary.  However, if your symptoms get significantly worse, please go to the Emergency Department to seek immediate medical attention.  Nobie Putnam, [place]

## 2015-05-05 NOTE — Progress Notes (Signed)
Subjective:    Patient ID: Gabriela [name], female    DOB: Sep 27, 1960, 55 y.o.   MRN: [id number]  Gabriela [name] is a 55 y.o. female presenting on 05/05/2015 for Flank Pain   HPI   LEFT ABDOMINAL PAIN, INTERMITTENT: - Reports symptoms started about 1 month ago with Left upper abdominal, seem to occur every day with a constant dull LUQ 5/10 for hours, sometimes worse after eating, also experiences sharp LUQ pain 10/10 lasting only seconds, symptoms not worsening but not improving. Constipation with movements normally every 2-3 days (sometimes hard stools), since using laxative has had more regular BMs. - Tried OTC "general laxative pill" does not know name, tried 1 dose weekly for 2 weeks with some improvement - Establishd with [employer], last colonoscopy 04/2011 (adenomatous polyps, next due repeat 5 years, 04/2016) - Last imaging abdomen CT 2013 for abdominal pain, no etiology identified, also no comments on gall bladder or gall stones - Admits intermittent nausea (without vomiting), fatigued (decreased energy) for few months - admits to prior loss of job x 2 last year, currently working new job for past 5 months (variable hours 8-10 hours, often switches shifts to 2nd / 3rd shifts) - Admits heartburns / GERD (takes tums and rolaids, no rx PPI) - Denies any fevers/chills, chest pain, difficulty breathing, dysuria, hematuria, urinary frequency  FATIGUE: - Chronic problem for past few months, describes decreased energy, but denies increased sleepiness. Admits to prior loss of job x 2 last year, currently working new job for past 5 months (variable hours 8-10 hours, often switches shifts to 2nd / 3rd shifts) - Requesting labs, but has had BMET, CBC, TSH all within past 1 year and unremarkable, discussed results - Denies insomnia (sleep onset or latency problems), depression or mood problems  Surgical history: - Abdominal hysterectomy, umbilical hernia repair  Social History    Substance Use Topics  . Smoking status: Current Every Day Smoker -- 0.10 packs/day    Last Attempt to Quit: 07/25/2013  . Smokeless tobacco: Never Used     Comment: only smokes 1 -2 cigarettes daily.  . Alcohol Use: 1.2 oz/week    2 Glasses of wine per week    Review of Systems Per HPI unless specifically indicated above     Objective:    BP 146/92 mmHg  Pulse 58  Temp(Src) 97.6 F (36.4 C) (Oral)  Wt 207 lb 4.8 oz (94.031 kg)  Wt Readings from Last 3 Encounters:  05/05/15 207 lb 4.8 oz (94.031 kg)  02/03/15 208 lb 14.4 oz (94.756 kg)  02/01/15 204 lb 14.4 oz (92.942 kg)    Physical Exam  Constitutional: She appears well-developed and well-nourished. No distress.  Well-appearing, comfortable, cooperative  HENT:  Head: Normocephalic and atraumatic.  Mouth/Throat: Oropharynx is clear and moist.  Neck: Normal range of motion. No thyromegaly present.  Cardiovascular: Normal rate, regular rhythm, normal heart sounds and intact distal pulses.   No murmur heard. Pulmonary/Chest: Effort normal and breath sounds normal. No respiratory distress. She has no wheezes. She has no rales.  Abdominal: Soft. Bowel sounds are normal. She exhibits no distension and no mass. There is tenderness (mild discomfort without clear tenderness LLQ). There is no rebound and no guarding.  Negative Murphy's sign, normal liver margins  Musculoskeletal: She exhibits no edema.  Neurological: She is alert.  Skin: Skin is warm and dry. No rash noted. She is not diaphoretic.  Nursing note and vitals reviewed.  Results for orders placed or  performed during the hospital encounter of 02/04/15  CBC with Differential/Platelet  Result Value Ref Range   WBC 8.7 4.0 - 10.5 K/uL   RBC 4.75 3.87 - 5.11 MIL/uL   Hemoglobin 12.9 12.0 - 15.0 g/dL   HCT 41.2 36.0 - 46.0 %   MCV 86.7 78.0 - 100.0 fL   MCH 27.2 26.0 - 34.0 pg   MCHC 31.3 30.0 - 36.0 g/dL   RDW 13.8 11.5 - 15.5 %   Platelets 351 150 - 400 K/uL    Neutrophils Relative % 46 %   Neutro Abs 4.0 1.7 - 7.7 K/uL   Lymphocytes Relative 44 %   Lymphs Abs 3.8 0.7 - 4.0 K/uL   Monocytes Relative 7 %   Monocytes Absolute 0.6 0.1 - 1.0 K/uL   Eosinophils Relative 3 %   Eosinophils Absolute 0.3 0.0 - 0.7 K/uL   Basophils Relative 0 %   Basophils Absolute 0.0 0.0 - 0.1 K/uL  D-dimer, quantitative (not at [facility])  Result Value Ref Range   D-Dimer, Quant <0.27 0.00 - 0.50 ug/mL-FEU  I-stat chem 8, ed  Result Value Ref Range   Sodium 143 135 - 145 mmol/L   Potassium 3.3 (L) 3.5 - 5.1 mmol/L   Chloride 101 101 - 111 mmol/L   BUN 15 6 - 20 mg/dL   Creatinine, Ser 0.80 0.44 - 1.00 mg/dL   Glucose, Bld 139 (H) 65 - 99 mg/dL   Calcium, Ion 1.28 (H) 1.12 - 1.23 mmol/L   TCO2 29 0 - 100 mmol/L   Hemoglobin 13.9 12.0 - 15.0 g/dL   HCT 41.0 36.0 - 46.0 %      Assessment & Plan:   Problem List Items Addressed This Visit    Hx of adenomatous colonic polyps    Unlikely related to current abdominal pain / symptoms. Advised follow-up colonoscopy 04/2016      Fatigue    Likely multifactorial, but probably related to new job with variable shifts 2nd/3rd. Currently functioning regular and working. No red flag symptoms. No evidence anemia, hypothyroid, metabolic problems on last labs within past 1 year.  Plan: 1. Reassurance, briefly reviewed basic sleep hygiene 2. Follow-up as needed, anticipate may need to consider changing job shifts in future if can      Constipation   Abdominal pain - Primary    Suspect underlying constipation and bowel spasms with possible prior history of IBS per chart. Seems functional, no GI red flag symptoms, benign abdomen today (actually pain free), not suggestive of other acute abdominal etiology (unlikely PUD, cholelithiasis, pancreatitis, appendicitis), unlikely GI malignancy (prior adenomatous polyp, last colonoscopy 2013, due 04/2016)  Plan: 1. Start Miralax 17g daily for few days, titrate as advised 2. Routine  constipation care, improve hydration, fiber, activity 3. Given rx Dicyclomine 10mg  PRN meals and QHS, may try for additional relief 4. No further labs or imaging today 5. Follow-up 4 weeks, see progress, consider referral to GI per patient request at that point if needed (may want to switch from [facility] to [place] by patient's preference), more emergent return criteria given      Relevant Medications   polyethylene glycol powder (GLYCOLAX/MIRALAX) powder   dicyclomine (BENTYL) 10 MG capsule      Meds ordered this encounter  Medications  . polyethylene glycol powder (GLYCOLAX/MIRALAX) powder    Sig: Take 17 g by mouth daily as needed. May increase to twice daily if not improved.    Dispense:  3350 g    Refill:  0  . dicyclomine (BENTYL) 10 MG capsule    Sig: Take 1 capsule (10 mg total) by mouth 4 (four) times daily -  before meals and at bedtime.    Dispense:  30 capsule    Refill:  0      Follow up plan: Return in about 4 weeks (around 06/02/2015) for LUQ abdominal pain.  Nobie Putnam, [place], PGY-3

## 2015-05-07 ENCOUNTER — Other Ambulatory Visit: Payer: Self-pay | Admitting: Family Medicine

## 2015-05-09 ENCOUNTER — Encounter (HOSPITAL_COMMUNITY): Payer: Self-pay | Admitting: *Deleted

## 2015-05-09 ENCOUNTER — Telehealth: Payer: Self-pay | Admitting: Family Medicine

## 2015-05-09 ENCOUNTER — Inpatient Hospital Stay (HOSPITAL_COMMUNITY)
Admission: AD | Admit: 2015-05-09 | Discharge: 2015-05-09 | Disposition: A | Discharge status: Home / Self Care | Payer: BLUE CROSS/BLUE SHIELD | Source: Ambulatory Visit | Attending: Family Medicine | Admitting: Family Medicine

## 2015-05-09 DIAGNOSIS — I10 Essential (primary) hypertension: Secondary | ICD-10-CM | POA: Insufficient documentation

## 2015-05-09 DIAGNOSIS — R1012 Left upper quadrant pain: Secondary | ICD-10-CM | POA: Diagnosis not present

## 2015-05-09 DIAGNOSIS — F17210 Nicotine dependence, cigarettes, uncomplicated: Secondary | ICD-10-CM | POA: Diagnosis not present

## 2015-05-09 DIAGNOSIS — E785 Hyperlipidemia, unspecified: Secondary | ICD-10-CM | POA: Insufficient documentation

## 2015-05-09 NOTE — Telephone Encounter (Signed)
Pt is still having pain in her side. She just doesn't feel right, she wants to get xrays or tests run. Please advise

## 2015-05-09 NOTE — MAU Note (Signed)
Patient c/o pain on left side of abdomen that radiates to the middle of her abdomen and has had pain for the last 3 weeks.  She was seen at [facility] family practice and has been taking miralax, but it has not helped.  Patient states she is having normal bowel movements.

## 2015-05-09 NOTE — MAU Provider Note (Signed)
History   55 yo female in with c/o left upper quadrand pain for apx 3 wks. States had through work up at [facility] family  practice and was prescribed mirilax which she has taken for 3 days but does not help.  CSN: [id number]  Arrival date & time 05/09/15  1551   First Provider Initiated Contact with Patient 05/09/15 1656      Chief Complaint  Patient presents with  . Abdominal Pain    HPI  Past Medical History  Diagnosis Date  . Smoker     ONE PACK PER WEEK  . Anemia   . Hypertension   . Benign colon polyp 08/2005    adenomatous  . Dysmenorrhea   . Fibroid   . Chest pain   . Irregular heart beat   . Hyperlipidemia     Past Surgical History  Procedure Laterality Date  . 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 Umbilical hernia repair      AS A CHILD  . Abdominal hysterectomy  06/2001    Toney Rakes, M.D.  . Cardiac catheterization  2003    Dr Gwenlyn Found  . Transthoracic echocardiogram  07/03/04    TRACE MR,TRACE TR,TRACE PVR  . Cardiolite myocardial perfusion  06/07/03    NO PERFUSION ABNORMALITY. EF% 68%.  . Lower ext arterial  07/28/08    NORMAL    Family History  Problem Relation Age of Onset  . Heart disease Mother   . Hypertension Mother   . Stroke Father   . Colon cancer Neg Hx   . Stomach cancer Neg Hx     Social History  Substance Use Topics  . Smoking status: Current Every Day Smoker -- 0.10 packs/day    Last Attempt to Quit: 07/25/2013  . Smokeless tobacco: Never Used     Comment: only smokes 1 -2 cigarettes daily.  . Alcohol Use: 1.2 oz/week    2 Glasses of wine per week    OB History    Gravida Para Term Preterm AB TAB SAB Ectopic Multiple Living   3 1 0 0 2 2 0 0 0 1       Review of Systems  Constitutional: Negative.   HENT: Negative.   Eyes: Negative.   Respiratory: Negative.   Cardiovascular: Negative.   Gastrointestinal: Positive for abdominal pain.  Endocrine: Negative.   Genitourinary: Negative.   Musculoskeletal: Negative.    Skin: Negative.   Allergic/Immunologic: Negative.   Neurological: Negative.   Hematological: Negative.   Psychiatric/Behavioral: Negative.     Allergies  Review of patient's allergies indicates no known allergies.  Home Medications  No current outpatient prescriptions on file.  BP 141/83 mmHg  Pulse 56  Temp(Src) 98.2 F (36.8 C) (Oral)  Resp 16  Ht 5\' 7"  (1.702 m)  SpO2 100%  Physical Exam  Constitutional: She is oriented to person, place, and time. She appears well-developed and well-nourished.  HENT:  Head: Normocephalic.  Eyes: Pupils are equal, round, and reactive to light.  Neck: Normal range of motion.  Cardiovascular: Normal rate, regular rhythm, normal heart sounds and intact distal pulses.   Pulmonary/Chest: Effort normal and breath sounds normal.  Abdominal: Soft. Bowel sounds are normal.  Genitourinary: Vagina normal and uterus normal.  Musculoskeletal: Normal range of motion.  Neurological: She is alert and oriented to person, place, and time. She has normal reflexes.  Skin: Skin is warm and dry.  Psychiatric: She has a normal mood and affect. Her behavior is normal. Judgment  and thought content normal.    MAU Course  Procedures (including critical care time)  Labs Reviewed - No data to display No results found.   No diagnosis found.    MDM  Left upper quad pain After lengthy discussion and diet review i encouraged pt not to eat nuts, any food with seeds and call to make apt with her gastro doctor for evaluation. Pt verbalized understanding. D/c home

## 2015-05-09 NOTE — Discharge Instructions (Signed)

## 2015-05-09 NOTE — MAU Note (Signed)
Urine in lab 

## 2015-05-09 NOTE — Telephone Encounter (Signed)
2nd request.  Jaklyn Alen L, RN  

## 2015-05-09 NOTE — Telephone Encounter (Signed)
Last visit 05/05/15 for same day appointment, for chronic Left sided abdominal pain. See note for more details.  Suspected that component of constipation and maybe IBS with functional abdominal pain, given rx Miralax and Dicyclomine.  Additionally, it appears since patient had called our office she has gone to [facility] MAU for same abdominal pain. Their records do not indicate any further testing or imaging performed, they advised her to avoid eating nuts, seeds, and making dietary changes. No further treatment. Advised she may follow-up with her GI doctor as planned.  Please call patient back to inform her of the following: 1. No additional test or imaging study to do at this time. 2. Recommend to continue using the Miralax and she should try the Dicyclomine prescription (as advised during last office visit 05/05/15, can take 1 tab every 8 hours as needed for abdominal pain / spasms) 3. If new or worsening symptoms, she should return to see her Primary Doctor or can go to Urgent Care or Emergency Department. 4. She should follow-up with PCP within 2 to 4 weeks for re-evaluation Abdominal Pain, and may place GI referral at that time if needed.  Nobie Putnam, [place], PGY-3

## 2015-05-10 ENCOUNTER — Encounter: Payer: Self-pay | Admitting: *Deleted

## 2015-05-10 ENCOUNTER — Telehealth: Payer: Self-pay | Admitting: Family Medicine

## 2015-05-10 NOTE — Telephone Encounter (Signed)
Will forward to MD to advise as to why patient is unable to get more refills at once. Jazmin Hartsell,CMA

## 2015-05-10 NOTE — Telephone Encounter (Signed)
Spoke with patient and she is fine with making an appt but there isn't a follow up appt available until mid may.  Patient will be out of medication before then and would like to know if she can be scheduled on a same day slot sooner than that.  Jazmin Hartsell,CMA

## 2015-05-10 NOTE — Telephone Encounter (Signed)
She can  Come in for evaluation if she thinks her medicine needs to be adjusted. Will add refills if needed at next refill request. Not sure why there weren't any refills.   CGM MD

## 2015-05-10 NOTE — Telephone Encounter (Signed)
Pt called because she doesn't understand why she has to call and wait every month for her BP medication. There are never any refills on these. She also thinks that her blood pressure is changing and that the doctor might have to increase the dosage. Please call patient and let her know if the doctor wants to change the dosage or if she needs a appointment. jw

## 2015-05-10 NOTE — Telephone Encounter (Signed)
Lm for patient to call back, of note, patient has followup scheduled with gastroenterologist for tomorrow.

## 2015-05-11 ENCOUNTER — Encounter: Payer: Self-pay | Admitting: Internal Medicine

## 2015-05-11 ENCOUNTER — Ambulatory Visit (INDEPENDENT_AMBULATORY_CARE_PROVIDER_SITE_OTHER): Payer: BLUE CROSS/BLUE SHIELD | Admitting: Internal Medicine

## 2015-05-11 VITALS — BP 136/78 | HR 84 | Ht 67.0 in | Wt 205.0 lb

## 2015-05-11 DIAGNOSIS — Z86010 Personal history of colonic polyps: Secondary | ICD-10-CM | POA: Diagnosis not present

## 2015-05-11 DIAGNOSIS — R109 Unspecified abdominal pain: Secondary | ICD-10-CM | POA: Diagnosis not present

## 2015-05-11 DIAGNOSIS — R140 Abdominal distension (gaseous): Secondary | ICD-10-CM

## 2015-05-11 DIAGNOSIS — K5900 Constipation, unspecified: Secondary | ICD-10-CM | POA: Diagnosis not present

## 2015-05-11 MED ORDER — CIPROFLOXACIN HCL 500 MG PO TABS: 500.0000 mg | ORAL_TABLET | Freq: Two times a day (BID) | ORAL | Status: DC

## 2015-05-11 MED ORDER — METRONIDAZOLE 250 MG PO TABS: 250.0000 mg | ORAL_TABLET | Freq: Three times a day (TID) | ORAL | Status: DC

## 2015-05-11 MED ORDER — NA SULFATE-K SULFATE-MG SULF 17.5-3.13-1.6 GM/177ML PO SOLN: ORAL | Status: DC

## 2015-05-11 NOTE — Patient Instructions (Signed)
You have been scheduled for a colonoscopy. Please follow written instructions given to you at your visit today.  Please pick up your prep supplies at the pharmacy within the next 1-3 days. If you use inhalers (even only as needed), please bring them with you on the day of your procedure. Your physician has requested that you go to www.startemmi.com and enter the access code given to you at your visit today. This web site gives a general overview about your procedure. However, you should still follow specific instructions given to you by our office regarding your preparation for the procedure.  We have sent the following medications to your pharmacy for you to pick up at your convenience: Cipro 500 mg twice daily x 7 days Flagyl 250 mg three times daily x 7 days  Please take Miralax 17 grams daily  Take your dicyclomine 1-2 tablets three times daily as needed  Please follow a low gas diet.  If you are age 74 or older, your body mass index should be between 23-30. Your Body mass index is 32.1 kg/(m^2). If this is out of the aforementioned range listed, please consider follow up with your Primary Care Provider.  If you are age 26 or younger, your body mass index should be between 19-25. Your Body mass index is 32.1 kg/(m^2). If this is out of the aformentioned range listed, please consider follow up with your Primary Care Provider.

## 2015-05-11 NOTE — Progress Notes (Signed)
Patient ID: Gabriela [name], female   DOB: November 19, 1960, 55 y.o.   MRN: [id number] HPI: Gabriela [name] is a 55 year old female with past medical history of adenomatous colon polyp, diverticulosis, hypertension, hyperlipidemia who seen in consultation at the request of Caleb Melancon to evaluate left-sided abdominal pain, bloating and constipation. She is known to me from screening colonoscopy which was performed on 05/03/2011. This showed normal terminal ileum. A 3 mm adenomatous ascending colon polyp was removed. A 3 mm hyperplastic descending colon polyp was removed. Mild diverticulosis was found in the right side of the colon.  She reports over the last 3-4 weeks she has developed left-sided abdominal discomfort in the left middle and left upper abdomen. This seems to move around and feels like "gas building up". She has had some constipation associated with hard and thinner stools. This is worse with eating particularly vegetables. She tried Tylenol without benefit. She's having some nausea and occasional heartburn. No vomiting. No dysphagia or odynophagia. No fevers or chills. She saw primary care who recommended MiraLAX she's only use this twice. Dicyclomine was given but not started by the patient. She is worried that something is wrong in her colon and she will read on the Internet that then stools could be associated with malignancy. She denies rectal bleeding or melena.  Labs were done earlier this year were normal without anemia or microcytosis  Past Medical History  Diagnosis Date  . Smoker     ONE PACK PER WEEK  . Anemia   . Hypertension   . Benign colon polyp     adenomatous  . Dysmenorrhea   . Fibroid   . Chest pain   . Irregular heart beat   . Hyperlipidemia     Past Surgical History  Procedure Laterality Date  . 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 Umbilical hernia repair      AS A CHILD  . Abdominal hysterectomy  06/2001    Toney Rakes, M.D.  . Cardiac catheterization   2003    Dr Gwenlyn Found  . Transthoracic echocardiogram  07/03/04    TRACE MR,TRACE TR,TRACE PVR  . Cardiolite myocardial perfusion  06/07/03    NO PERFUSION ABNORMALITY. EF% 68%.  . Lower ext arterial  07/28/08    NORMAL    Outpatient Prescriptions Prior to Visit  Medication Sig Dispense Refill  . atorvastatin (LIPITOR) 20 MG tablet Take 1 tablet (20 mg total) by mouth daily. 90 tablet 3  . Black Cohosh (BLACK COHOSH HOT FLASH RELIEF) 40 MG CAPS Take 80 mg by mouth daily.    . cetirizine HCl (ZYRTEC) 5 MG/5ML SYRP Take 5 mLs (5 mg total) by mouth daily. 300 mL 0  . cholecalciferol (VITAMIN D) 1000 UNITS tablet Take 1,000 Units by mouth daily.    Marland Kitchen co-enzyme Q-10 30 MG capsule Take 200 mg by mouth daily    . hydrochlorothiazide (HYDRODIURIL) 25 MG tablet TAKE ONE TABLET BY MOUTH ONCE DAILY 30 tablet 0  . ibuprofen (ADVIL,MOTRIN) 800 MG tablet Take 1 tablet (800 mg total) by mouth 3 (three) times daily. 21 tablet 0  . polyethylene glycol powder (GLYCOLAX/MIRALAX) powder Take 17 g by mouth daily as needed. May increase to twice daily if not improved. 3350 g 0  . zaleplon (SONATA) 5 MG capsule Take 1 capsule (5 mg total) by mouth at bedtime as needed for sleep. 30 capsule 0  . dicyclomine (BENTYL) 10 MG capsule Take 1 capsule (10 mg total) by mouth 4 (four)  times daily -  before meals and at bedtime. (Patient not taking: Reported on 05/11/2015) 30 capsule 0   No facility-administered medications prior to visit.    No Known Allergies  Family History  Problem Relation Age of Onset  . Heart disease Mother   . Hypertension Mother   . Stroke Father   . Colon cancer Neg Hx   . Stomach cancer Neg Hx     Social History  Substance Use Topics  . Smoking status: Current Every Day Smoker -- 0.10 packs/day    Last Attempt to Quit: 07/25/2013  . Smokeless tobacco: Never Used     Comment: only smokes 1 -2 cigarettes daily.  . Alcohol Use: 1.2 oz/week    2 Glasses of wine per week    ROS: As per  history of present illness, otherwise negative  BP 136/78 mmHg  Pulse 84  Ht 5\' 7"  (1.702 m)  Wt 205 lb (92.987 kg)  BMI 32.10 kg/m2 Constitutional: Well-developed and well-nourished. No distress. HEENT: Normocephalic and atraumatic.  Conjunctivae are normal.  No scleral icterus. Neck: Neck supple. Trachea midline. Cardiovascular: Normal rate, regular rhythm and intact distal pulses. No M/R/G Pulmonary/chest: Effort normal and breath sounds normal. No wheezing, rales or rhonchi. Abdominal: Soft, Tender in the left upper and left middle quadrant which is mild without rebound or guarding nontender, nondistended. Bowel sounds active throughout. There are no masses palpable. No hepatosplenomegaly. Extremities: no clubbing, cyanosis, or edema Lymphadenopathy: No cervical adenopathy noted. Neurological: Alert and oriented to person place and time. Skin: Skin is warm and dry. No rashes noted. Psychiatric: Normal mood and affect. Behavior is normal.  RELEVANT LABS AND IMAGING: CBC    Component Value Date/Time   WBC 8.7 02/04/2015 0449   RBC 4.75 02/04/2015 0449   HGB 13.9 02/04/2015 0456   HCT 41.0 02/04/2015 0456   PLT 351 02/04/2015 0449   MCV 86.7 02/04/2015 0449   MCH 27.2 02/04/2015 0449   MCHC 31.3 02/04/2015 0449   RDW 13.8 02/04/2015 0449   LYMPHSABS 3.8 02/04/2015 0449   MONOABS 0.6 02/04/2015 0449   EOSABS 0.3 02/04/2015 0449   BASOSABS 0.0 02/04/2015 0449    CMP     Component Value Date/Time   NA 143 02/04/2015 0456   K 3.3* 02/04/2015 0456   CL 101 02/04/2015 0456   CO2 32 06/02/2014 1536   GLUCOSE 139* 02/04/2015 0456   BUN 15 02/04/2015 0456   CREATININE 0.80 02/04/2015 0456   CREATININE 0.77 06/02/2014 1536   CALCIUM 10.2 06/02/2014 1536   PROT 6.9 10/12/2014 1133   ALBUMIN 4.3 10/12/2014 1133   AST 18 10/12/2014 1133   ALT 25 10/12/2014 1133   ALKPHOS 108 10/12/2014 1133   BILITOT 0.5 10/12/2014 1133   GFRNONAA 53* 09/24/2013 0048   GFRAA 61*  09/24/2013 0048    ASSESSMENT/PLAN: 55 year old female with past medical history of adenomatous colon polyp, diverticulosis, hypertension, hyperlipidemia who seen in consultation to evaluate left-sided abdominal pain, bloating and constipation.  1. Left-sided abdominal discomfort with bloating and constipation -- I cannot exclude mild diverticular inflammation given pain on palpation and associated symptoms. This also could be constipation driven completely with bloating and irritable bowel. I have recommended that she become more consistent with MiraLAX and take 17 g daily. This should help avoid constipation and colonic distention. Bentyl 10-20 mg can be used 3 times a day as needed for crampy left-sided pain. I'm going to empiric treatment with antibiotics with Cipro 500 twice a  day and metronidazole 250 3 times a day 7 days for possible diverticulitis. She is asked to notify me if her symptoms do not improve with this treatment. She voices understanding  2. History of adenomatous colon polyp -- surveillance previously recommended for 2018 but given her concern for possible malignancy we discussed earlier surveillance. After our discussion of the risk benefits and alternatives she prefers to proceed. If all symptoms as discussed in #1 completely resolved this procedure may be canceled by the patient.    [id number] G Melancon, Md [address] [place], [place] [address]

## 2015-05-19 ENCOUNTER — Telehealth: Payer: Self-pay | Admitting: Family Medicine

## 2015-05-19 NOTE — Telephone Encounter (Signed)
Will forward to MD to advise.  Patient was seen by GI and in our clinic for "side pain" this month.  Maziyah Vessel,CMA

## 2015-05-19 NOTE — Telephone Encounter (Signed)
Patient still having intermittent abdominal pain on the left side. Not associated with breathing.  She saw GI recently.  Her medications helps but then comes back. She completed the antibiotics that they prescribed. Their note states to follow up if her symptoms do not resolve.  As her symptoms do not sound like rib fractures or something easily detectable on X-ray, I discuss that an x-ray would not be a good option for her.  I suggest that she call back GI, as they may perform a colonoscopy sooner.  Archie Patten, MD [facility] Resident  05/19/2015, 5:02 PM

## 2015-05-19 NOTE — Telephone Encounter (Signed)
Patient called to ask for order to have Xray to her rt side/still having a lot of pain in that area

## 2015-06-07 ENCOUNTER — Encounter: Payer: Self-pay | Admitting: Internal Medicine

## 2015-06-09 ENCOUNTER — Other Ambulatory Visit: Payer: Self-pay | Admitting: Family Medicine

## 2015-06-09 ENCOUNTER — Telehealth: Payer: Self-pay | Admitting: Family Medicine

## 2015-06-09 NOTE — Telephone Encounter (Signed)
Please send to pharmacy multiple refills on patient's hctz medication.  Don't want to contact practice every month to get refill.

## 2015-06-10 NOTE — Telephone Encounter (Signed)
Filled it before I saw this message. Will address at next refill.   CGM MD

## 2015-06-21 ENCOUNTER — Encounter: Payer: BLUE CROSS/BLUE SHIELD | Admitting: Internal Medicine

## 2015-07-07 ENCOUNTER — Encounter: Payer: Self-pay | Admitting: Women's Health

## 2015-07-07 ENCOUNTER — Ambulatory Visit (INDEPENDENT_AMBULATORY_CARE_PROVIDER_SITE_OTHER): Payer: BLUE CROSS/BLUE SHIELD | Admitting: Women's Health

## 2015-07-07 VITALS — BP 130/80 | Ht 67.0 in | Wt 202.0 lb

## 2015-07-07 DIAGNOSIS — Z01419 Encounter for gynecological examination (general) (routine) without abnormal findings: Secondary | ICD-10-CM | POA: Diagnosis not present

## 2015-07-07 DIAGNOSIS — Z79890 Hormone replacement therapy (postmenopausal): Secondary | ICD-10-CM | POA: Diagnosis not present

## 2015-07-07 MED ORDER — ESTRADIOL 0.5 MG PO TABS: 0.5000 mg | ORAL_TABLET | Freq: Every day | ORAL | Status: DC

## 2015-07-07 NOTE — Progress Notes (Signed)
Gabriela [name] May 05, 1960 [id number]    History:    Presents for annual exam.  2003 TAH for fibroids with dysmenorrhea. Rare smoker. Normal Pap and mammogram history. 2007, 2013 benign colon polyps. Numerous hot flashes causing poor sleep had tried the Vivelle patch 0.1 had severe breast tenderness. Hypertension hypercholesterolemia managed by primary care. Same partner.  Past medical history, past surgical history, family history and social history were all reviewed and documented in the EPIC chart. Has one son doing well. Has had difficult employment in the last couple years was laid off from [employer] has worked [employer] jobs.  ROS:  A ROS was performed and pertinent positives and negatives are included.  Exam:  Filed Vitals:   07/07/15 1148  BP: 130/80    General appearance:  Normal Thyroid:  Symmetrical, normal in size, without palpable masses or nodularity. Respiratory  Auscultation:  Clear without wheezing or rhonchi Cardiovascular  Auscultation:  Regular rate, without rubs, murmurs or gallops  Edema/varicosities:  Not grossly evident Abdominal  Soft,nontender, without masses, guarding or rebound.  Liver/spleen:  No organomegaly noted  Hernia:  None appreciated  Skin  Inspection:  Grossly normal   Breasts: Examined lying and sitting.     Right: Without masses, retractions, discharge or axillary adenopathy.     Left: Without masses, retractions, discharge or axillary adenopathy. Gentitourinary   Inguinal/mons:  Normal without inguinal adenopathy  External genitalia:  Normal  BUS/Urethra/Skene's glands:  Normal  Vagina:  Normal  Cervix: Uterus absent  Adnexa/parametria:     Rt: Without masses or tenderness.   Lt: Without masses or tenderness.  Anus and perineum: Normal  Digital rectal exam: Normal sphincter tone without palpated masses or tenderness  Assessment/Plan:  55 y.o. SBF G1 P1 for annual exam.     TAH for fibroids with numerous hot flashes Rare  smoker Hypertension/hypercholesterolemia-primary care manages labs and meds  Plan: Options reviewed, will try estradiol 0.5 tablet if breasts become tender try half tablet daily. Reviewed slight risk for blood clots, strokes and breast cancer, best to stay on shortest amount of time. Encouraged vaginal lubricants for vaginal dryness. SBE's, continue annual 3-D screening mammogram. Exercise, calcium rich foods, continue to decrease calories for continued weight loss. UA. Aware of hazards of smoking reviewed importance of no smoking.   Huel Cote WHNP, 1:16 PM 07/07/2015

## 2015-07-07 NOTE — Progress Notes (Signed)
Baseline bone density study this year

## 2015-07-07 NOTE — Patient Instructions (Addendum)
Menopause is a normal process in which your reproductive ability comes to an end. This process happens gradually over a span of months to years, usually between the ages of 48 and 55. Menopause is complete when you have missed 12 consecutive menstrual periods. It is important to talk with your health care provider about some of the most common conditions that affect postmenopausal women, such as heart disease, cancer, and bone loss (osteoporosis). Adopting a healthy lifestyle and getting preventive care can help to promote your health and wellness. Those actions can also lower your chances of developing some of these common conditions. WHAT SHOULD I KNOW ABOUT MENOPAUSE? During menopause, you may experience a number of symptoms, such as:  Moderate-to-severe hot flashes.  Night sweats.  Decrease in sex drive.  Mood swings.  Headaches.  Tiredness.  Irritability.  Memory problems.  Insomnia. Choosing to treat or not to treat menopausal changes is an individual decision that you make with your health care provider. WHAT SHOULD I KNOW ABOUT HORMONE REPLACEMENT THERAPY AND SUPPLEMENTS? Hormone therapy products are effective for treating symptoms that are associated with menopause, such as hot flashes and night sweats. Hormone replacement carries certain risks, especially as you become older. If you are thinking about using estrogen or estrogen with progestin treatments, discuss the benefits and risks with your health care provider. WHAT SHOULD I KNOW ABOUT HEART DISEASE AND STROKE? Heart disease, heart attack, and stroke become more likely as you age. This may be due, in part, to the hormonal changes that your body experiences during menopause. These can affect how your body processes dietary fats, triglycerides, and cholesterol. Heart attack and stroke are both medical emergencies. There are many things that you can do to help prevent heart disease and stroke:  Have your blood pressure  checked at least every 1-2 years. High blood pressure causes heart disease and increases the risk of stroke.  If you are 55-79 years old, ask your health care provider if you should take aspirin to prevent a heart attack or a stroke.  Do not use any tobacco products, including cigarettes, chewing tobacco, or electronic cigarettes. If you need help quitting, ask your health care provider.  It is important to eat a healthy diet and maintain a healthy weight.  Be sure to include plenty of vegetables, fruits, low-fat dairy products, and lean protein.  Avoid eating foods that are high in solid fats, added sugars, or salt (sodium).  Get regular exercise. This is one of the most important things that you can do for your health.  Try to exercise for at least 150 minutes each week. The type of exercise that you do should increase your heart rate and make you sweat. This is known as moderate-intensity exercise.  Try to do strengthening exercises at least twice each week. Do these in addition to the moderate-intensity exercise.  Know your numbers.Ask your health care provider to check your cholesterol and your blood glucose. Continue to have your blood tested as directed by your health care provider. WHAT SHOULD I KNOW ABOUT CANCER SCREENING? There are several types of cancer. Take the following steps to reduce your risk and to catch any cancer development as early as possible. Breast Cancer  Practice breast self-awareness.  This means understanding how your breasts normally appear and feel.  It also means doing regular breast self-exams. Let your health care provider know about any changes, no matter how small.  If you are 40 or older, have a clinician do a   breast exam (clinical breast exam or CBE) every year. Depending on your age, family history, and medical history, it may be recommended that you also have a yearly breast X-ray (mammogram).  If you have a family history of breast cancer,  talk with your health care provider about genetic screening.  If you are at high risk for breast cancer, talk with your health care provider about having an MRI and a mammogram every year.  Breast cancer (BRCA) gene test is recommended for women who have family members with BRCA-related cancers. Results of the assessment will determine the need for genetic counseling and BRCA1 and for BRCA2 testing. BRCA-related cancers include these types:  Breast. This occurs in males or females.  Ovarian.  Tubal. This may also be called fallopian tube cancer.  Cancer of the abdominal or pelvic lining (peritoneal cancer).  Prostate.  Pancreatic. Cervical, Uterine, and Ovarian Cancer Your health care provider may recommend that you be screened regularly for cancer of the pelvic organs. These include your ovaries, uterus, and vagina. This screening involves a pelvic exam, which includes checking for microscopic changes to the surface of your cervix (Pap test).  For women ages 21-65, health care providers may recommend a pelvic exam and a Pap test every three years. For women ages 77-65, they may recommend the Pap test and pelvic exam, combined with testing for human papilloma virus (HPV), every five years. Some types of HPV increase your risk of cervical cancer. Testing for HPV may also be done on women of any age who have unclear Pap test results.  Other health care providers may not recommend any screening for nonpregnant women who are considered low risk for pelvic cancer and have no symptoms. Ask your health care provider if a screening pelvic exam is right for you.  If you have had past treatment for cervical cancer or a condition that could lead to cancer, you need Pap tests and screening for cancer for at least 20 years after your treatment. If Pap tests have been discontinued for you, your risk factors (such as having a new sexual partner) need to be reassessed to determine if you should start having  screenings again. Some women have medical problems that increase the chance of getting cervical cancer. In these cases, your health care provider may recommend that you have screening and Pap tests more often.  If you have a family history of uterine cancer or ovarian cancer, talk with your health care provider about genetic screening.  If you have vaginal bleeding after reaching menopause, tell your health care provider.  There are currently no reliable tests available to screen for ovarian cancer. Lung Cancer Lung cancer screening is recommended for adults 3-70 years old who are at high risk for lung cancer because of a history of smoking. A yearly low-dose CT scan of the lungs is recommended if you:  Currently smoke.  Have a history of at least 30 pack-years of smoking and you currently smoke or have quit within the past 15 years. A pack-year is smoking an average of one pack of cigarettes per day for one year. Yearly screening should:  Continue until it has been 15 years since you quit.  Stop if you develop a health problem that would prevent you from having lung cancer treatment. Colorectal Cancer  This type of cancer can be detected and can often be prevented.  Routine colorectal cancer screening usually begins at age 38 and continues through age 12.  If you have  risk factors for colon cancer, your health care provider may recommend that you be screened at an earlier age.  If you have a family history of colorectal cancer, talk with your health care provider about genetic screening.  Your health care provider may also recommend using home test kits to check for hidden blood in your stool.  A small camera at the end of a tube can be used to examine your colon directly (sigmoidoscopy or colonoscopy). This is done to check for the earliest forms of colorectal cancer.  Direct examination of the colon should be repeated every 5-10 years until age 67. However, if early forms of  precancerous polyps or small growths are found or if you have a family history or genetic risk for colorectal cancer, you may need to be screened more often. Skin Cancer  Check your skin from head to toe regularly.  Monitor any moles. Be sure to tell your health care provider:  About any new moles or changes in moles, especially if there is a change in a mole's shape or color.  If you have a mole that is larger than the size of a pencil eraser.  If any of your family members has a history of skin cancer, especially at a Gabriela [name] age, talk with your health care provider about genetic screening.  Always use sunscreen. Apply sunscreen liberally and repeatedly throughout the day.  Whenever you are outside, protect yourself by wearing long sleeves, pants, a wide-brimmed hat, and sunglasses. WHAT SHOULD I KNOW ABOUT OSTEOPOROSIS? Osteoporosis is a condition in which bone destruction happens more quickly than new bone creation. After menopause, you may be at an increased risk for osteoporosis. To help prevent osteoporosis or the bone fractures that can happen because of osteoporosis, the following is recommended:  If you are 39-61 years old, get at least 1,000 mg of calcium and at least 600 mg of vitamin D per day.  If you are older than age 16 but younger than age 7, get at least 1,200 mg of calcium and at least 600 mg of vitamin D per day.  If you are older than age 47, get at least 1,200 mg of calcium and at least 800 mg of vitamin D per day. Smoking and excessive alcohol intake increase the risk of osteoporosis. Eat foods that are rich in calcium and vitamin D, and do weight-bearing exercises several times each week as directed by your health care provider. WHAT SHOULD I KNOW ABOUT HOW MENOPAUSE AFFECTS Gabriela [name]? Depression may occur at any age, but it is more common as you become older. Common symptoms of depression include:  Low or sad mood.  Changes in sleep patterns.  Changes  in appetite or eating patterns.  Feeling an overall lack of motivation or enjoyment of activities that you previously enjoyed.  Frequent crying spells. Talk with your health care provider if you think that you are experiencing depression. WHAT SHOULD I KNOW ABOUT IMMUNIZATIONS? It is important that you get and maintain your immunizations. These include:  Tetanus, diphtheria, and pertussis (Tdap) booster vaccine.  Influenza every year before the flu season begins.  Pneumonia vaccine.  Shingles vaccine. Your health care provider may also recommend other immunizations.   This information is not intended to replace advice given to you by your health care provider. Make sure you discuss any questions you have with your health care provider.   Document Released: 03/02/2005 Document Revised: 01/29/2014 Document Reviewed: 09/10/2013 Elsevier Interactive Patient Education 2016 Else[employer]  [employer] Smoking Cessation, Tips for Success If you are ready to quit smoking, congratulations! You have chosen to help yourself be healthier. Cigarettes bring nicotine, tar, carbon monoxide, and other irritants into your body. Your lungs, heart, and blood vessels will be able to work better without these poisons. There are many different ways to quit smoking. Nicotine gum, nicotine patches, a nicotine inhaler, or nicotine nasal spray can help with physical craving. Hypnosis, support groups, and medicines help break the habit of smoking. WHAT THINGS CAN I DO TO MAKE QUITTING EASIER?  Here are some tips to help you quit for good:  Pick a date when you will quit smoking completely. Tell all of your friends and family about your plan to quit on that date.  Do not try to slowly cut down on the number of cigarettes you are smoking. Pick a quit date and quit smoking completely starting on that day.  Throw away all cigarettes.   Clean and remove all ashtrays from your home, work, and car.  On a card, write down your  reasons for quitting. Carry the card with you and read it when you get the urge to smoke.  Cleanse your body of nicotine. Drink enough water and fluids to keep your urine clear or pale yellow. Do this after quitting to flush the nicotine from your body.  Learn to predict your moods. Do not let a bad situation be your excuse to have a cigarette. Some situations in your life might tempt you into wanting a cigarette.  Never have "just one" cigarette. It leads to wanting another and another. Remind yourself of your decision to quit.  Change habits associated with smoking. If you smoked while driving or when feeling stressed, try other activities to replace smoking. Stand up when drinking your coffee. Brush your teeth after eating. Sit in a different chair when you read the paper. Avoid alcohol while trying to quit, and try to drink fewer caffeinated beverages. Alcohol and caffeine may urge you to smoke.  Avoid foods and drinks that can trigger a desire to smoke, such as sugary or spicy foods and alcohol.  Ask people who smoke not to smoke around you.  Have something planned to do right after eating or having a cup of coffee. For example, plan to take a walk or exercise.  Try a relaxation exercise to calm you down and decrease your stress. Remember, you may be tense and nervous for the first 2 weeks after you quit, but this will pass.  Find new activities to keep your hands busy. Play with a pen, coin, or rubber band. Doodle or draw things on paper.  Brush your teeth right after eating. This will help cut down on the craving for the taste of tobacco after meals. You can also try mouthwash.   Use oral substitutes in place of cigarettes. Try using lemon drops, carrots, cinnamon sticks, or chewing gum. Keep them handy so they are available when you have the urge to smoke.  When you have the urge to smoke, try deep breathing.  Designate your home as a nonsmoking area.  If you are a heavy smoker,  ask your health care provider about a prescription for nicotine chewing gum. It can ease your withdrawal from nicotine.  Reward yourself. Set aside the cigarette money you save and buy yourself something nice.  Look for support from others. Join a support group or smoking cessation program. Ask someone at home or at work to help you with   your plan to quit smoking.  Always ask yourself, "Do I need this cigarette or is this just a reflex?" Tell yourself, "Today, I choose not to smoke," or "I do not want to smoke." You are reminding yourself of your decision to quit.  Do not replace cigarette smoking with electronic cigarettes (commonly called e-cigarettes). The safety of e-cigarettes is unknown, and some may contain harmful chemicals.  If you relapse, do not give up! Plan ahead and think about what you will do the next time you get the urge to smoke. HOW WILL I FEEL WHEN I QUIT SMOKING? You may have symptoms of withdrawal because your body is used to nicotine (the addictive substance in cigarettes). You may crave cigarettes, be irritable, feel very hungry, cough often, get headaches, or have difficulty concentrating. The withdrawal symptoms are only temporary. They are strongest when you first quit but will go away within 10-14 days. When withdrawal symptoms occur, stay in control. Think about your reasons for quitting. Remind yourself that these are signs that your body is healing and getting used to being without cigarettes. Remember that withdrawal symptoms are easier to treat than the major diseases that smoking can cause.  Even after the withdrawal is over, expect periodic urges to smoke. However, these cravings are generally short lived and will go away whether you smoke or not. Do not smoke! WHAT RESOURCES ARE AVAILABLE TO HELP ME QUIT SMOKING? Your health care provider can direct you to community resources or hospitals for support, which may include:  Group  support.  Education.  Hypnosis.  Therapy.   This information is not intended to replace advice given to you by your health care provider. Make sure you discuss any questions you have with your health care provider.   Document Released: 10/07/2003 Document Revised: 01/29/2014 Document Reviewed: 06/26/2012 Elsevier Interactive Patient Education 2016 Elsevier Inc.  

## 2015-07-08 LAB — URINALYSIS W MICROSCOPIC + REFLEX CULTURE
BACTERIA UA: NONE SEEN [HPF]
Bilirubin Urine: NEGATIVE
CASTS: NONE SEEN [LPF]
CRYSTALS: NONE SEEN [HPF]
Glucose, UA: NEGATIVE
HGB URINE DIPSTICK: NEGATIVE
KETONES UR: NEGATIVE
Leukocytes, UA: NEGATIVE
Nitrite: NEGATIVE
PH: 8 (ref 5.0–8.0)
RBC / HPF: NONE SEEN RBC/HPF (ref <=2)
Specific Gravity, Urine: 1.021 (ref 1.001–1.035)
WBC, UA: NONE SEEN WBC/HPF (ref <=5)
YEAST: NONE SEEN [HPF]

## 2015-07-11 ENCOUNTER — Other Ambulatory Visit: Payer: Self-pay | Admitting: Student

## 2015-07-11 MED ORDER — HYDROCHLOROTHIAZIDE 25 MG PO TABS: 25.0000 mg | ORAL_TABLET | Freq: Every day | ORAL | Status: DC

## 2015-07-11 NOTE — Telephone Encounter (Signed)
Needs refill on HCTZ.  Pt doesn't understand why she has to call in every month to request refills.  [place]

## 2015-07-14 ENCOUNTER — Encounter: Payer: Self-pay | Admitting: Podiatry

## 2015-07-14 ENCOUNTER — Ambulatory Visit (INDEPENDENT_AMBULATORY_CARE_PROVIDER_SITE_OTHER): Payer: BLUE CROSS/BLUE SHIELD | Admitting: Podiatry

## 2015-07-14 DIAGNOSIS — Q828 Other specified congenital malformations of skin: Secondary | ICD-10-CM | POA: Diagnosis not present

## 2015-07-14 DIAGNOSIS — B070 Plantar wart: Secondary | ICD-10-CM

## 2015-07-14 DIAGNOSIS — B079 Viral wart, unspecified: Secondary | ICD-10-CM

## 2015-07-14 NOTE — Patient Instructions (Signed)

## 2015-07-14 NOTE — Progress Notes (Signed)
She presents today for recurrence of a callus in the plantar aspect of the fifth metatarsal base area of the left foot. She states this is painful and I can hardly walk on it. She states that it was held in when we trimmed out the last time but it was exquisitely painful.  Objective: Vital signs are stable she is alert and oriented 3. Pulses remain palpable left foot. This lesion very well could be of verrucoid nature very much like of involuted verruca vulgaris. It is painful on palpation and skin lines do circumvent the lesion however there are no thrombosed capillaries visible.  Assessment: This could be a porokeratosis or a verruca plantaris.  Plan: A surgical curettage today performed after local anesthesia was administered. She tolerated this procedure well. The lesion was sent for pathologic evaluation I dispensed suspected verruca with this. She was given both oral and written home-going instructions for wound care and soaking of her left foot I will follow up with her in the next week or 2 for reevaluation to make sure that she is healing well.

## 2015-07-22 ENCOUNTER — Telehealth: Payer: Self-pay | Admitting: *Deleted

## 2015-07-22 NOTE — Telephone Encounter (Signed)
Pt states her foot still hurts where the wart was removed, denies redness, swelling or drainage.  I told pt to use epsom salt soaks 1/2 C epsom salt in 1 qt water, and if able to tolerate OTC Ibuprofen as package directs, pad the area off when weight bearing.  Pt states understanding.

## 2015-07-28 ENCOUNTER — Ambulatory Visit: Payer: BLUE CROSS/BLUE SHIELD | Admitting: Podiatry

## 2015-08-07 ENCOUNTER — Emergency Department (HOSPITAL_COMMUNITY): Payer: BLUE CROSS/BLUE SHIELD

## 2015-08-07 ENCOUNTER — Encounter (HOSPITAL_COMMUNITY): Payer: Self-pay

## 2015-08-07 DIAGNOSIS — R0789 Other chest pain: Secondary | ICD-10-CM | POA: Diagnosis not present

## 2015-08-07 DIAGNOSIS — Z87891 Personal history of nicotine dependence: Secondary | ICD-10-CM | POA: Diagnosis not present

## 2015-08-07 DIAGNOSIS — Z79899 Other long term (current) drug therapy: Secondary | ICD-10-CM | POA: Insufficient documentation

## 2015-08-07 DIAGNOSIS — I10 Essential (primary) hypertension: Secondary | ICD-10-CM | POA: Insufficient documentation

## 2015-08-07 LAB — I-STAT TROPONIN, ED: Troponin i, poc: 0 ng/mL (ref 0.00–0.08)

## 2015-08-07 NOTE — ED Notes (Signed)
Pt here for chest pain that started around 1600. She reports pain started in left neck and then radiated into her chest. Pt denies chest pain at present.

## 2015-08-08 ENCOUNTER — Emergency Department (HOSPITAL_COMMUNITY)
Admission: EM | Admit: 2015-08-08 | Discharge: 2015-08-08 | Disposition: A | Discharge status: Home / Self Care | Payer: BLUE CROSS/BLUE SHIELD | Attending: Emergency Medicine | Admitting: Emergency Medicine

## 2015-08-08 DIAGNOSIS — R079 Chest pain, unspecified: Secondary | ICD-10-CM

## 2015-08-08 LAB — BASIC METABOLIC PANEL
Anion gap: 7 (ref 5–15)
BUN: 10 mg/dL (ref 6–20)
CHLORIDE: 104 mmol/L (ref 101–111)
CO2: 28 mmol/L (ref 22–32)
CREATININE: 0.75 mg/dL (ref 0.44–1.00)
Calcium: 9.8 mg/dL (ref 8.9–10.3)
GFR calc non Af Amer: >60 mL/min (ref >60)
GLUCOSE: 99 mg/dL (ref 65–99)
Potassium: 3.5 mmol/L (ref 3.5–5.1)
Sodium: 139 mmol/L (ref 135–145)

## 2015-08-08 LAB — CBC
HCT: 40.8 % (ref 36.0–46.0)
HEMOGLOBIN: 13 g/dL (ref 12.0–15.0)
MCH: 27.2 pg (ref 26.0–34.0)
MCHC: 31.9 g/dL (ref 30.0–36.0)
MCV: 85.4 fL (ref 78.0–100.0)
PLATELETS: 361 10*3/uL (ref 150–400)
RBC: 4.78 MIL/uL (ref 3.87–5.11)
RDW: 12.7 % (ref 11.5–15.5)
WBC: 7.7 10*3/uL (ref 4.0–10.5)

## 2015-08-08 MED ORDER — OMEPRAZOLE 20 MG PO CPDR: 20.0000 mg | DELAYED_RELEASE_CAPSULE | Freq: Every day | ORAL | Status: DC

## 2015-08-08 NOTE — ED Provider Notes (Signed)
CSN: [id number]     Arrival date & time 08/07/15  2245 History   First MD Initiated Contact with Patient 08/08/15 0340     Chief Complaint  Patient presents with  . Chest Pain    HPI Patient presents to the emergency department with complaints of transient chest discomfort which began tonight.  She had 2 episodes.  Each episode lasted approximately one second per the patient.  Shoulda Shea shortness of breath.  He was located in the center of her chest.  She does report that she's had a stress test at a heart catheterization in the past both of which were negative.  She sees Dr. Gwenlyn Found from a cardiac standpoint.  She denies recent illness.  No fevers or chills.  Denies productive cough.  No unilateral leg swelling.  Denies abdominal pain.  Denies nausea vomiting diarrhea.  Reports her symptoms have resolved at this time.   Past Medical History  Diagnosis Date  . Smoker     ONE PACK PER WEEK  . Anemia   . Hypertension   . Benign colon polyp     adenomatous  . Dysmenorrhea   . Fibroid   . Chest pain   . Irregular heart beat    Past Surgical History  Procedure Laterality Date  . 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 Umbilical hernia repair      AS A CHILD  . Abdominal hysterectomy  06/2001    Toney Rakes, M.D.  . Cardiac catheterization  2003    Dr Gwenlyn Found  . Transthoracic echocardiogram  07/03/04    TRACE MR,TRACE TR,TRACE PVR  . Cardiolite myocardial perfusion  06/07/03    NO PERFUSION ABNORMALITY. EF% 68%.  . Lower ext arterial  07/28/08    NORMAL   Family History  Problem Relation Age of Onset  . Heart disease Mother   . Hypertension Mother   . Stroke Father   . Colon cancer Neg Hx   . Stomach cancer Neg Hx    Social History  Substance Use Topics  . Smoking status: Former Smoker -- 0.10 packs/day    Quit date: 07/13/2013  . Smokeless tobacco: Never Used     Comment: only smokes 1 -2 cigarettes daily.  . Alcohol Use: 1.2 oz/week    2 Glasses of wine per week    OB History    Gravida Para Term Preterm AB TAB SAB Ectopic Multiple Living   3 1 0 0 2 2 0 0 0 1      Review of Systems  All other systems reviewed and are negative.     Allergies  Review of patient's allergies indicates no known allergies.  Home Medications   Prior to Admission medications   Medication Sig Start Date End Date Taking? Authorizing Provider  cetirizine HCl (ZYRTEC) 5 MG/5ML SYRP Take 5 mLs (5 mg total) by mouth daily. 02/01/15   Veatrice Bourbon, MD  cholecalciferol (VITAMIN D) 1000 UNITS tablet Take 1,000 Units by mouth daily.    Historical Provider, MD  co-enzyme Q-10 30 MG capsule Take 200 mg by mouth daily 10/18/14   Lorretta Harp, MD  estradiol (ESTRACE) 0.5 MG tablet Take 1 tablet (0.5 mg total) by mouth daily. 07/07/15   Huel Cote, NP  hydrochlorothiazide (HYDRODIURIL) 25 MG tablet Take 1 tablet (25 mg total) by mouth daily. 07/11/15   Veatrice Bourbon, MD  ibuprofen (ADVIL,MOTRIN) 800 MG tablet Take 1 tablet (800 mg total) by mouth 3 (three)  times daily. 02/04/15   [place], PA-C   BP 155/77 mmHg  Pulse 54  Temp(Src) 97.9 F (36.6 C) (Oral)  Resp 16  Ht 5\' 7"  (1.702 m)  Wt 196 lb (88.905 kg)  BMI 30.69 kg/m2  SpO2 100% Physical Exam  Constitutional: She is oriented to person, place, and time. She appears well-developed and well-nourished. No distress.  HENT:  Head: Normocephalic and atraumatic.  Eyes: EOM are normal.  Neck: Normal range of motion.  Cardiovascular: Normal rate, regular rhythm and normal heart sounds.   Pulmonary/Chest: Effort normal and breath sounds normal.  Abdominal: Soft. She exhibits no distension. There is no tenderness.  Musculoskeletal: Normal range of motion.  Neurological: She is alert and oriented to person, place, and time.  Skin: Skin is warm and dry.  Psychiatric: She has a normal mood and affect. Judgment normal.  Nursing note and vitals reviewed.   ED Course  Procedures (including critical care  time) Labs Review Labs Reviewed  BASIC METABOLIC PANEL  CBC  I-STAT [place], ED    Imaging Review Dg Chest 2 View  08/08/2015  CLINICAL DATA:  Substernal chest pain EXAM: CHEST  2 VIEW COMPARISON:  September 24, 2013 FINDINGS: The heart, hila, and mediastinum are normal. No suspicious pulmonary nodules, masses, or infiltrates. No acute abnormalities. IMPRESSION: No active cardiopulmonary disease. Electronically Signed   By: Dorise Bullion III M.D   On: 08/08/2015 01:19   I have personally reviewed and evaluated these images and lab results as part of my medical decision-making.   EKG Interpretation   Date/Time:  Sunday August 07 2015 22:59:02 EDT Ventricular Rate:  67 PR Interval:  144 QRS Duration: 82 QT Interval:  402 QTC Calculation: 424 R Axis:   29 Text Interpretation:  Normal sinus rhythm with sinus arrhythmia Normal ECG  No significant change was found Confirmed by Christphor Groft  MD, Carigan Lister ([id number]) on  08/08/2015 3:53:34 AM      MDM   Final diagnoses:  Chest pain, unspecified chest pain type    Patient is overall well-appearing.  This is transient chest discomfort.  I will initiate patient on Prilosec.  Primary care follow-up.  Patient understands return to the ER for new or worsening symptoms    Jola Schmidt, MD 08/08/15 [phone]

## 2015-08-08 NOTE — ED Notes (Signed)
Pt departed in NAD, refused use of wheelchair.  

## 2015-08-08 NOTE — ED Notes (Signed)
Pt states she thinks some daily vitamins she takes actually contains caffeine. She was taken off caffeine by her cardiologist "years ago" and believes this may have played a role in the earlier pain.

## 2015-08-11 ENCOUNTER — Ambulatory Visit: Payer: BLUE CROSS/BLUE SHIELD | Admitting: Podiatry

## 2015-09-02 ENCOUNTER — Ambulatory Visit (INDEPENDENT_AMBULATORY_CARE_PROVIDER_SITE_OTHER): Payer: BLUE CROSS/BLUE SHIELD | Admitting: Family Medicine

## 2015-09-02 VITALS — BP 144/86 | HR 69 | Temp 98.2°F | Ht 67.0 in | Wt 196.0 lb

## 2015-09-02 DIAGNOSIS — H65191 Other acute nonsuppurative otitis media, right ear: Secondary | ICD-10-CM

## 2015-09-02 MED ORDER — AMOXICILLIN 500 MG PO CAPS: 500.0000 mg | ORAL_CAPSULE | Freq: Two times a day (BID) | ORAL | 0 refills | Status: DC

## 2015-09-02 MED ORDER — ANTIPYRINE-BENZOCAINE 5.4-1.4 % OT SOLN: 3.0000 [drp] | OTIC | 0 refills | Status: DC | PRN

## 2015-09-02 NOTE — Patient Instructions (Signed)

## 2015-09-05 NOTE — Progress Notes (Signed)
Subjective:     Patient ID: Gabriela [name], female   DOB: Jun 05, 1960, 55 y.o.   MRN: [id number]  HPI Mrs. Prier is a 55yo female presenting with right ear pain. - Notes right ear pain x1week - Symptoms not improving - Also notes congestion, but only when she is cold - Denies fever, sore throat - Has been using Olive Oil with minimal improvement  Review of Systems Per HPI. Other systems negative.    Objective:   Physical Exam  Constitutional: She appears well-developed and well-nourished. No distress.  HENT:  Mouth/Throat: Oropharynx is clear and moist.  Erythema noted on right tympanic membrane. Left tympanic membrane normal.       Assessment and Plan:     1. Other acute nonsuppurative otitis media of right ear - Given no improvement over one week, prescription for Amoxicillin sent to pharmacy - Auralgan ear drops for pain - Follow up if symptoms worsen or fail to resolve.

## 2015-10-18 ENCOUNTER — Encounter: Payer: Self-pay | Admitting: Cardiovascular Disease

## 2015-10-18 ENCOUNTER — Ambulatory Visit (INDEPENDENT_AMBULATORY_CARE_PROVIDER_SITE_OTHER): Payer: BLUE CROSS/BLUE SHIELD | Admitting: Cardiovascular Disease

## 2015-10-18 DIAGNOSIS — E785 Hyperlipidemia, unspecified: Secondary | ICD-10-CM | POA: Diagnosis not present

## 2015-10-18 DIAGNOSIS — Z79899 Other long term (current) drug therapy: Secondary | ICD-10-CM

## 2015-10-18 NOTE — Progress Notes (Signed)
     10/18/2015 ROSALIND KOLLIN   Mar 26, 1960  [id number]  Primary Physician Marina Goodell, MD Primary Cardiologist: Lorretta Harp MD Renae Gloss  HPI:  Ms. Levendusky is a 55 year old moderately overweight single African American female mother of one child, grandmother and 2 grandchildren who I last saw in the office 10/12/14. Her cardiac risk factors include family history with a mother who died myocardial infarction at age 31, hypertension, hyperlipidemia and recently discontinued tobacco abuse. She did have a heart catheterization which I performed 07/06/04 which wasn't normal. She does complain of occasional chest pain occurring every several months. These have not changed in frequency or severity.  Current Outpatient Prescriptions  Medication Sig Dispense Refill  . dicyclomine (BENTYL) 10 MG capsule Take 10 mg by mouth. 4 times daily before meals & bedtime    . estradiol (ESTRACE) 0.5 MG tablet Take 1 tablet (0.5 mg total) by mouth daily. 90 tablet 4  . hydrochlorothiazide (HYDRODIURIL) 25 MG tablet Take 1 tablet (25 mg total) by mouth daily. 30 tablet 3   No current facility-administered medications for this visit.     No Known Allergies  Social History   Social History  . Marital status: Single    Spouse name: N/A  . Number of children: N/A  . Years of education: N/A   Occupational History  . Unemployed  [place] History Main Topics  . Smoking status: Former Smoker    Packs/day: 0.10    Quit date: 07/13/2013  . Smokeless tobacco: Never Used     Comment: only smokes 1 -2 cigarettes daily.  . Alcohol use 1.2 oz/week    2 Glasses of wine per week  . Drug use: No  . Sexual activity: Yes   Other Topics Concern  . Not on file   Social History Narrative  . No narrative on file     Review of Systems: General: negative for chills, fever, night sweats or weight changes.  Cardiovascular: negative for chest pain, dyspnea on exertion, edema,  orthopnea, palpitations, paroxysmal nocturnal dyspnea or shortness of breath Dermatological: negative for rash Respiratory: negative for cough or wheezing Urologic: negative for hematuria Abdominal: negative for nausea, vomiting, diarrhea, bright red blood per rectum, melena, or hematemesis Neurologic: negative for visual changes, syncope, or dizziness All other systems reviewed and are otherwise negative except as noted above.    There were no vitals taken for this visit.  General appearance: alert and no distress Neck: no adenopathy, no carotid bruit, no JVD, supple, symmetrical, trachea midline and thyroid not enlarged, symmetric, no tenderness/mass/nodules Lungs: clear to auscultation bilaterally Heart: regular rate and rhythm, S1, S2 normal, no murmur, click, rub or gallop Extremities: extremities normal, atraumatic, no cyanosis or edema  EKG not performed today  ASSESSMENT AND PLAN:   HYPERLIPIDEMIA History of hyperlipidemia on pravastatin in the past which he stopped because of financial issues. We will recheck a lipid and liver profile  Hypertension History of hypertension with blood pressures measured 144/86. She is on hydrochlorothiazide. Continue current meds at current dose  Tobacco abuse History of recently discontinued tobacco use      Lorretta Harp MD [facility], [facility] 10/18/2015 11:59 AM

## 2015-10-18 NOTE — Assessment & Plan Note (Signed)
History of hyperlipidemia on pravastatin in the past which he stopped because of financial issues. We will recheck a lipid and liver profile

## 2015-10-18 NOTE — Assessment & Plan Note (Signed)
History of recently discontinued tobacco use

## 2015-10-18 NOTE — Patient Instructions (Signed)
Medication Instructions:  NO CHANGES.  Labwork: Your physician recommends that you return for lab work in: AT [place]. You will need to be fasting.   Follow-Up: Your physician wants you to follow-up in: [place]. You will receive a reminder letter in the mail two months in advance. If you don't receive a letter, please call our office to schedule the follow-up appointment.  If you need a refill on your cardiac medications before your next appointment, please call your pharmacy.

## 2015-10-18 NOTE — Assessment & Plan Note (Signed)
History of hypertension with blood pressures measured 144/86. She is on hydrochlorothiazide. Continue current meds at current dose

## 2015-12-05 ENCOUNTER — Telehealth: Payer: Self-pay | Admitting: Student

## 2015-12-05 NOTE — Telephone Encounter (Signed)
Pt is calling because she needs refills on her  Hydrochlorothiazide called in to the [facility] at [facility]. jw

## 2015-12-06 MED ORDER — HYDROCHLOROTHIAZIDE 25 MG PO TABS: 25.0000 mg | ORAL_TABLET | Freq: Every day | ORAL | 3 refills | Status: DC

## 2015-12-06 NOTE — Telephone Encounter (Signed)
2nd request. ep °

## 2015-12-22 ENCOUNTER — Other Ambulatory Visit: Payer: Self-pay | Admitting: Gynecology

## 2015-12-22 DIAGNOSIS — Z1231 Encounter for screening mammogram for malignant neoplasm of breast: Secondary | ICD-10-CM

## 2016-01-18 ENCOUNTER — Ambulatory Visit
Admission: RE | Admit: 2016-01-18 | Discharge: 2016-01-18 | Disposition: A | Discharge status: Home / Self Care | Payer: BLUE CROSS/BLUE SHIELD | Source: Ambulatory Visit | Attending: Gynecology | Admitting: Gynecology

## 2016-01-18 DIAGNOSIS — Z1231 Encounter for screening mammogram for malignant neoplasm of breast: Secondary | ICD-10-CM

## 2016-01-18 LAB — LIPID PANEL
CHOL/HDL RATIO: 2.8 ratio (ref <5.0)
CHOLESTEROL: 231 mg/dL — AB (ref <200)
HDL: 83 mg/dL (ref >50)
LDL Cholesterol: 132 mg/dL — ABNORMAL HIGH (ref <100)
Triglycerides: 78 mg/dL (ref <150)
VLDL: 16 mg/dL (ref <30)

## 2016-01-18 LAB — HEPATIC FUNCTION PANEL
ALBUMIN: 4.3 g/dL (ref 3.6–5.1)
ALT: 21 U/L (ref 6–29)
AST: 20 U/L (ref 10–35)
Alkaline Phosphatase: 107 U/L (ref 33–130)
BILIRUBIN TOTAL: 0.5 mg/dL (ref 0.2–1.2)
Bilirubin, Direct: 0.1 mg/dL (ref <=0.2)
Indirect Bilirubin: 0.4 mg/dL (ref 0.2–1.2)
Total Protein: 7 g/dL (ref 6.1–8.1)

## 2016-01-27 ENCOUNTER — Encounter: Payer: Self-pay | Admitting: Cardiovascular Disease

## 2016-02-07 ENCOUNTER — Ambulatory Visit: Payer: BLUE CROSS/BLUE SHIELD

## 2016-02-10 ENCOUNTER — Encounter: Payer: Self-pay | Admitting: *Deleted

## 2016-02-27 ENCOUNTER — Encounter: Payer: Self-pay | Admitting: Internal Medicine

## 2016-04-03 DIAGNOSIS — M1612 Unilateral primary osteoarthritis, left hip: Secondary | ICD-10-CM | POA: Diagnosis not present

## 2016-04-03 DIAGNOSIS — M1711 Unilateral primary osteoarthritis, right knee: Secondary | ICD-10-CM | POA: Diagnosis not present

## 2016-04-03 DIAGNOSIS — M545 Low back pain: Secondary | ICD-10-CM | POA: Diagnosis not present

## 2016-04-16 DIAGNOSIS — Z1211 Encounter for screening for malignant neoplasm of colon: Secondary | ICD-10-CM | POA: Diagnosis not present

## 2016-04-18 ENCOUNTER — Other Ambulatory Visit: Payer: Self-pay | Admitting: Student

## 2016-04-18 MED ORDER — HYDROXYZINE HCL 10 MG PO TABS: 10.0000 mg | ORAL_TABLET | Freq: Three times a day (TID) | ORAL | 1 refills | Status: AC | PRN

## 2016-04-18 NOTE — Telephone Encounter (Signed)
Pt calling to request refill of:  Name of Medication(s):  Hydroxyzine Last date of OV:  09-02-15 Pharmacy: [facility]  Will route refill request to Clinic RN.  Discussed with patient policy to call pharmacy for future refills.  Also, discussed refills may take up to 48 hours to approve or deny.  Renella Cunas

## 2016-04-19 ENCOUNTER — Other Ambulatory Visit: Payer: Self-pay | Admitting: Student

## 2016-04-19 MED ORDER — HYDROCHLOROTHIAZIDE 25 MG PO TABS: 25.0000 mg | ORAL_TABLET | Freq: Every day | ORAL | 3 refills | Status: DC

## 2016-04-19 NOTE — Telephone Encounter (Signed)
Pt calling to request refill of:  Name of Medication(s):  Hydrochlorothiazide  Last date of OV:  09-02-15 Pharmacy:  [facility]  Will route refill request to Clinic RN.  Discussed with patient policy to call pharmacy for future refills.  Also, discussed refills may take up to 48 hours to approve or deny.  Gabriela [name]  Pt states she would like this filled as soon as possible.

## 2016-04-26 DIAGNOSIS — Z1211 Encounter for screening for malignant neoplasm of colon: Secondary | ICD-10-CM | POA: Diagnosis not present

## 2016-04-26 DIAGNOSIS — K635 Polyp of colon: Secondary | ICD-10-CM | POA: Diagnosis not present

## 2016-04-26 DIAGNOSIS — D122 Benign neoplasm of ascending colon: Secondary | ICD-10-CM | POA: Diagnosis not present

## 2016-04-26 DIAGNOSIS — K5730 Diverticulosis of large intestine without perforation or abscess without bleeding: Secondary | ICD-10-CM | POA: Diagnosis not present

## 2016-05-31 ENCOUNTER — Emergency Department (HOSPITAL_COMMUNITY)
Admission: EM | Admit: 2016-05-31 | Discharge: 2016-06-01 | Disposition: A | Discharge status: Home / Self Care | Payer: 59 | Attending: Emergency Medicine | Admitting: Emergency Medicine

## 2016-05-31 ENCOUNTER — Encounter (HOSPITAL_COMMUNITY): Payer: Self-pay | Admitting: Emergency Medicine

## 2016-05-31 DIAGNOSIS — R109 Unspecified abdominal pain: Secondary | ICD-10-CM | POA: Diagnosis not present

## 2016-05-31 DIAGNOSIS — Z955 Presence of coronary angioplasty implant and graft: Secondary | ICD-10-CM | POA: Insufficient documentation

## 2016-05-31 DIAGNOSIS — Z79899 Other long term (current) drug therapy: Secondary | ICD-10-CM | POA: Insufficient documentation

## 2016-05-31 DIAGNOSIS — K5900 Constipation, unspecified: Secondary | ICD-10-CM | POA: Diagnosis not present

## 2016-05-31 DIAGNOSIS — Z87891 Personal history of nicotine dependence: Secondary | ICD-10-CM | POA: Diagnosis not present

## 2016-05-31 DIAGNOSIS — I10 Essential (primary) hypertension: Secondary | ICD-10-CM | POA: Diagnosis not present

## 2016-05-31 LAB — CBC
HEMATOCRIT: 37.4 % (ref 36.0–46.0)
HEMOGLOBIN: 12.4 g/dL (ref 12.0–15.0)
MCH: 28.1 pg (ref 26.0–34.0)
MCHC: 33.2 g/dL (ref 30.0–36.0)
MCV: 84.8 fL (ref 78.0–100.0)
Platelets: 337 10*3/uL (ref 150–400)
RBC: 4.41 MIL/uL (ref 3.87–5.11)
RDW: 13 % (ref 11.5–15.5)
WBC: 8.9 10*3/uL (ref 4.0–10.5)

## 2016-05-31 LAB — COMPREHENSIVE METABOLIC PANEL
ALT: 27 U/L (ref 14–54)
ANION GAP: 8 (ref 5–15)
AST: 25 U/L (ref 15–41)
Albumin: 4.5 g/dL (ref 3.5–5.0)
Alkaline Phosphatase: 107 U/L (ref 38–126)
BUN: 15 mg/dL (ref 6–20)
CALCIUM: 9.8 mg/dL (ref 8.9–10.3)
CHLORIDE: 103 mmol/L (ref 101–111)
CO2: 30 mmol/L (ref 22–32)
Creatinine, Ser: 1 mg/dL (ref 0.44–1.00)
GFR calc non Af Amer: >60 mL/min (ref >60)
Glucose, Bld: 145 mg/dL — ABNORMAL HIGH (ref 65–99)
POTASSIUM: 2.8 mmol/L — AB (ref 3.5–5.1)
SODIUM: 141 mmol/L (ref 135–145)
Total Bilirubin: 0.5 mg/dL (ref 0.3–1.2)
Total Protein: 7.8 g/dL (ref 6.5–8.1)

## 2016-05-31 LAB — LIPASE, BLOOD: Lipase: 22 U/L (ref 11–51)

## 2016-05-31 NOTE — ED Triage Notes (Signed)
Pt comes with complaints of nausea and lower abdominal pain.  Had a colonoscopy a few weeks ago which was negative for everything except for polyps.  Endorses constipation at the beginning of the week and used an enema with minimal relief. Pt states she is still able to pass gas.  Hx hernia. Ambulatory. A&O x4.

## 2016-06-01 ENCOUNTER — Emergency Department (HOSPITAL_COMMUNITY): Payer: 59

## 2016-06-01 DIAGNOSIS — K5900 Constipation, unspecified: Secondary | ICD-10-CM | POA: Diagnosis not present

## 2016-06-01 LAB — URINALYSIS, ROUTINE W REFLEX MICROSCOPIC
BILIRUBIN URINE: NEGATIVE
Glucose, UA: NEGATIVE mg/dL
Hgb urine dipstick: NEGATIVE
KETONES UR: NEGATIVE mg/dL
Leukocytes, UA: NEGATIVE
NITRITE: NEGATIVE
PROTEIN: NEGATIVE mg/dL
Specific Gravity, Urine: 1.004 — ABNORMAL LOW (ref 1.005–1.030)
pH: 6 (ref 5.0–8.0)

## 2016-06-01 MED ORDER — POLYETHYLENE GLYCOL 3350 17 G PO PACK: PACK | ORAL | 0 refills | Status: DC

## 2016-06-01 MED ORDER — POTASSIUM CHLORIDE CRYS ER 20 MEQ PO TBCR
40.0000 meq | EXTENDED_RELEASE_TABLET | Freq: Once | ORAL | Status: AC
  Administered 2016-06-01: 40 meq via ORAL
  Filled 2016-06-01: qty 2

## 2016-06-01 NOTE — ED Notes (Signed)
Patient verbalizes understanding of discharge instructions, prescriptions, home care and follow up care. Patient out of department at this time. 

## 2016-06-01 NOTE — ED Provider Notes (Signed)
[facility] DEPT Provider Note   CSN: [id number] Arrival date & time: 05/31/16  2235   By signing my name below, I, Eunice Blase, attest that this documentation has been prepared under the direction and in the presence of Charlann Lange, PA-C. Electronically Signed: Eunice Blase, Scribe. 06/01/16. 12:41 AM.   History   Chief Complaint Chief Complaint  Patient presents with  . Nausea  . Abdominal Pain  . Constipation   The history is provided by the patient and medical records. No language interpreter was used.    DONNI OGLESBY is a 56 y.o. female with h/o recurring constipation and a hernia, who presents to the Emergency Department with concern for persistent, worsening abdominal pain x ~1 week. Associated 8/10, constant, aching, cramping, central abdominal pain noted. Pt adds distension, nausea and constipation. Last BM reportedly yesterday morning, not described as dark, black or hard with a small amount reported. She notes sensation of difficulty clearing bowels. Colonoscopy performed 2 weeks ago for recurrent constipation; 2 benign polyps found at this time. She states she has used fiber supplements normally, miralax only for a bowel prep and an enema for the first and only time last week all with mild and temporary relief. No vomiting, vomiting, h/o abdominal surgeries reported. No other complaints at this time.  Past Medical History:  Diagnosis Date  . Anemia   . Benign colon polyp    adenomatous  . Chest pain   . Dysmenorrhea   . Fibroid   . Hypertension   . Irregular heart beat   . Smoker    ONE PACK PER WEEK    Patient Active Problem List   Diagnosis Date Noted  . Fatigue 05/05/2015  . Constipation 05/05/2015  . URI (upper respiratory infection) 02/01/2015  . Tobacco abuse 10/12/2014  . Insomnia 09/24/2014  . Mastodynia, female 07/12/2014  . Ankle edema 06/04/2014  . Abdominal pain 02/02/2014  . Chest pain 09/23/2013  . RUQ pain 08/26/2013  . Sprain of  ankle, unspecified site 06/08/2013  . Greater trochanteric bursitis of left hip 12/07/2011  . Hot flashes, menopausal 08/09/2011  . Dyspepsia 04/27/2011  . Stress 04/17/2011  . Ex-light cigarette smoker (1-9 per day)   . Hypertension   . Hx of adenomatous colonic polyps   . Overweight(278.02) 02/28/2009  . ALLERGIC RHINITIS CAUSE UNSPECIFIED 09/02/2008  . HYPERLIPIDEMIA 03/24/2008  . DEPRESSION, MAJOR, MILD 11/13/2007    Past Surgical History:  Procedure Laterality Date  . ABDOMINAL HYSTERECTOMY  06/2001   Toney Rakes, M.D.  . CARDIAC CATHETERIZATION  2003   Dr Gwenlyn Found  . CARDIOLITE MYOCARDIAL PERFUSION  06/07/03   NO PERFUSION ABNORMALITY. EF% 68%.  Marland Kitchen GYNECOLOGIC CRYOSURGERY  1993   DYSPLASIA  . LOWER EXT ARTERIAL  07/28/08   NORMAL  . TRANSTHORACIC ECHOCARDIOGRAM  07/03/04   TRACE MR,TRACE TR,TRACE PVR  . UMBILICAL HERNIA REPAIR     AS A CHILD    OB History    Gravida Para Term Preterm AB Living   3 1 0 0 2 1   SAB TAB Ectopic Multiple Live Births   0 2 0 0         Home Medications    Prior to Admission medications   Medication Sig Start Date End Date Taking? Authorizing Provider  dicyclomine (BENTYL) 10 MG capsule Take 10 mg by mouth. 4 times daily before meals & bedtime    [provider]  estradiol (ESTRACE) 0.5 MG tablet Take 1 tablet (0.5 mg total) by mouth  daily. 07/07/15   Huel Cote, NP  hydrochlorothiazide (HYDRODIURIL) 25 MG tablet Take 1 tablet (25 mg total) by mouth daily. 04/19/16   Veatrice Bourbon, MD  hydrOXYzine (ATARAX/VISTARIL) 10 MG tablet Take 1 tablet (10 mg total) by mouth 3 (three) times daily as needed for itching. 04/18/16   Veatrice Bourbon, MD    Family History Family History  Problem Relation Age of Onset  . Heart disease Mother   . Hypertension Mother   . Stroke Father   . Colon cancer Neg Hx   . Stomach cancer Neg Hx     Social History Social History  Substance Use Topics  . Smoking status: Former Smoker    Packs/day:  0.10    Quit date: 07/13/2013  . Smokeless tobacco: Never Used     Comment: only smokes 1 -2 cigarettes daily.  . Alcohol use 1.2 oz/week    2 Glasses of wine per week     Allergies   Patient has no known allergies.   Review of Systems Review of Systems  Constitutional: Negative for fever.  Gastrointestinal: Positive for abdominal distention, abdominal pain, constipation and nausea. Negative for vomiting.  All other systems reviewed and are negative.    Physical Exam Updated Vital Signs BP (!) 147/75 (BP Location: Right Arm)   Pulse 77   Temp 97.7 F (36.5 C) (Oral)   Resp 18   Wt 204 lb (92.5 kg)   SpO2 100%   BMI 31.95 kg/m   Physical Exam  Constitutional: She is oriented to person, place, and time. She appears well-developed and well-nourished.  HENT:  Head: Normocephalic and atraumatic.  Eyes: Conjunctivae are normal. Pupils are equal, round, and reactive to light. Right eye exhibits no discharge. Left eye exhibits no discharge. No scleral icterus.  Neck: Normal range of motion. No JVD present. No tracheal deviation present.  Pulmonary/Chest: Effort normal. No stridor.  Abdominal: Soft. Bowel sounds are normal. She exhibits no distension and no mass. There is no tenderness.  Neurological: She is alert and oriented to person, place, and time. Coordination normal.  Psychiatric: She has a normal mood and affect. Her behavior is normal. Judgment and thought content normal.  Nursing note and vitals reviewed.    ED Treatments / Results  DIAGNOSTIC STUDIES: Oxygen Saturation is 100% on RA, NL by my interpretation.    COORDINATION OF CARE: 12:30 AM-Discussed next steps with pt. Pt verbalized understanding and is agreeable with the plan. Will order imaging.   Labs (all labs ordered are listed, but only abnormal results are displayed) Labs Reviewed  COMPREHENSIVE METABOLIC PANEL - Abnormal; Notable for the following:       Result Value   Potassium 2.8 (*)     Glucose, Bld 145 (*)    All other components within normal limits  LIPASE, BLOOD  CBC  URINALYSIS, ROUTINE W REFLEX MICROSCOPIC    EKG  EKG Interpretation None       Radiology No results found.  Procedures Procedures (including critical care time)  Medications Ordered in ED Medications - No data to display   Initial Impression / Assessment and Plan / ED Course  I have reviewed the triage vital signs and the nursing notes.  Pertinent labs & imaging results that were available during my care of the patient were reviewed by me and considered in my medical decision making (see chart for details).     Patient with history of ventral hernia and recurrent constipation presents with intermittent  abdominal pain she was concerned was related to worsening hernia. She reports colonoscopy twice in the past for recurrent constipation, the last one 3 weeks ago.   No hernia on exam of the abdomen. Serial exams continue to be completely nontender without swelling, distention or mass. 2 view abdomen shows constipation without normal BGP. She is in NAD. VSS. She can be discharged home and is encouraged to follow up with GI.   Final Clinical Impressions(s) / ED Diagnoses   Final diagnoses:  None   1. Constipation 2. Abdominal pain  New Prescriptions New Prescriptions   No medications on file  I personally performed the services described in this documentation, which was scribed in my presence. The recorded information has been reviewed and is accurate.      Charlann Lange, PA-C 27/78/24 2353    Delora Fuel, MD 61/44/31 [phone]

## 2016-06-01 NOTE — Discharge Instructions (Signed)
See your gastroenterologist for further management of recurrent constipation and abdominal pain.

## 2016-06-06 ENCOUNTER — Encounter: Payer: Self-pay | Admitting: Gynecology

## 2016-07-30 ENCOUNTER — Encounter: Payer: 59 | Admitting: Internal Medicine

## 2016-08-03 ENCOUNTER — Ambulatory Visit (INDEPENDENT_AMBULATORY_CARE_PROVIDER_SITE_OTHER): Payer: 59 | Admitting: Family Medicine

## 2016-08-03 ENCOUNTER — Encounter: Payer: Self-pay | Admitting: Family Medicine

## 2016-08-03 VITALS — BP 134/86 | HR 83 | Temp 98.3°F | Ht 67.0 in | Wt 201.8 lb

## 2016-08-03 DIAGNOSIS — R1011 Right upper quadrant pain: Secondary | ICD-10-CM | POA: Diagnosis not present

## 2016-08-03 DIAGNOSIS — Z1159 Encounter for screening for other viral diseases: Secondary | ICD-10-CM | POA: Diagnosis not present

## 2016-08-03 DIAGNOSIS — I10 Essential (primary) hypertension: Secondary | ICD-10-CM | POA: Diagnosis not present

## 2016-08-03 DIAGNOSIS — E7800 Pure hypercholesterolemia, unspecified: Secondary | ICD-10-CM

## 2016-08-03 NOTE — Progress Notes (Signed)
   CC: annual physical, abdominal pain  HPI  Of note, patient abruptly lost her sister just 4 days ago. She is coping with this quite well. We looked at pictures and discussed her life. Patient is not sure exactly what the cause of death was, but states her sister had multisystem organ failure. She also has a family wedding this weekend. She acknowledges that these events span a breadth of emotions. This seems to influence her worry about her own health and requests labs today.   RUQ abdominal pain: Intermittent, resolves with moving around. Had a colonoscopy due to similar pains. L sided abd cramping about every other day - dull cramp. Thinks it might be gas. Tries fiber. Has ventral hernia, not repaired. No abd surgeries. Tries a tea, which helps her bowels. Normal BMs - up to twice per day. No blood in BMs. No radiation of pain.   Does her PAP at her gynecologist.  Has hip pain but sees ortho.   CC, SH/smoking status, and VS noted  Objective: BP 134/86   Pulse 83   Temp 98.3 F (36.8 C) (Oral)   Ht '5\' 7"'$  (1.702 m)   Wt 201 lb 12.8 oz (91.5 kg)   SpO2 96%   BMI 31.61 kg/m  Gen: NAD, alert, cooperative, and pleasant obese female.  HEENT: NCAT, EOMI, PERRL CV: RRR, no murmur Resp: CTAB, no wheezes, non-labored Abd: SNTND, BS present, no guarding or organomegaly Ext: No edema, warm Neuro: Alert and oriented, Speech clear, No gross deficits  Assessment and plan:  Hypertension BP mildly elevated today, did not take her HCTZ today as she thought she should be fasting for labs. Continue HCTZ, will check CMP.   HLD (hyperlipidemia) Patient requests a lipid profile. Hx of being on pravastatin stopped 2/2 finances. Last lipids 12/2015 with total cholesterol 231 and recommended lifestyle changes. Recheck lipids.  RUQ pain Exam benign. Likely MSK. Encouraged patient to continue exercise and focus on core strengthening.   Grief: patient abruptly lost her sister this week. She is  coping well. Counseled her that is normal for this to affect her more severely in the coming weeks as the adrenaline of funeral planning etc wears off. Discussed normal grief reactions, and suggested that we would be available for counseling anytime she needs.   Orders Placed This Encounter  Procedures  . CBC  . CMP14+EGFR  . Lipid panel  . HIV antibody  . Hepatitis C antibody    No orders of the defined types were placed in this encounter.  Health Maintenance: Epic suggests needs PAP, but patient gets elsewhere. Ordered screening HIV and Hep C.   Ralene Ok, MD, PGY2 08/06/2016 8:57 AM

## 2016-08-03 NOTE — Patient Instructions (Signed)
It was a pleasure to see you today! Thank you for choosing [facility] for your primary care. Gabriela [name] was seen for physical.   Our plans for today were:  For your abdominal pain - try icy hot or other topical creams, and also try to increase your core exercises like Pilates.  We will check several labs today, and I will call you if these are abnormal.  You should return to our clinic to see Dr. Juleen China in 6 months for physical.   Best,  Dr. Lindell Noe

## 2016-08-04 LAB — CMP14+EGFR
ALK PHOS: 109 IU/L (ref 39–117)
ALT: 21 IU/L (ref 0–32)
AST: 18 IU/L (ref 0–40)
Albumin/Globulin Ratio: 1.9 (ref 1.2–2.2)
Albumin: 4.6 g/dL (ref 3.5–5.5)
BUN/Creatinine Ratio: 11 (ref 9–23)
BUN: 9 mg/dL (ref 6–24)
Bilirubin Total: 0.5 mg/dL (ref 0.0–1.2)
CHLORIDE: 103 mmol/L (ref 96–106)
CO2: 25 mmol/L (ref 20–29)
CREATININE: 0.81 mg/dL (ref 0.57–1.00)
Calcium: 10 mg/dL (ref 8.7–10.2)
GFR calc Af Amer: 95 mL/min/{1.73_m2} (ref >59)
GFR calc non Af Amer: 82 mL/min/{1.73_m2} (ref >59)
GLOBULIN, TOTAL: 2.4 g/dL (ref 1.5–4.5)
Glucose: 94 mg/dL (ref 65–99)
POTASSIUM: 4.2 mmol/L (ref 3.5–5.2)
SODIUM: 143 mmol/L (ref 134–144)
Total Protein: 7 g/dL (ref 6.0–8.5)

## 2016-08-04 LAB — LIPID PANEL
Chol/HDL Ratio: 2.9 ratio (ref 0.0–4.4)
Cholesterol, Total: 214 mg/dL — ABNORMAL HIGH (ref 100–199)
HDL: 74 mg/dL (ref >39)
LDL Calculated: 128 mg/dL — ABNORMAL HIGH (ref 0–99)
Triglycerides: 62 mg/dL (ref 0–149)
VLDL CHOLESTEROL CAL: 12 mg/dL (ref 5–40)

## 2016-08-04 LAB — CBC
HEMATOCRIT: 39 % (ref 34.0–46.6)
Hemoglobin: 13 g/dL (ref 11.1–15.9)
MCH: 27.7 pg (ref 26.6–33.0)
MCHC: 33.3 g/dL (ref 31.5–35.7)
MCV: 83 fL (ref 79–97)
PLATELETS: 346 10*3/uL (ref 150–379)
RBC: 4.7 x10E6/uL (ref 3.77–5.28)
RDW: 13.4 % (ref 12.3–15.4)
WBC: 6.1 10*3/uL (ref 3.4–10.8)

## 2016-08-04 LAB — HIV ANTIBODY (ROUTINE TESTING W REFLEX): HIV Screen 4th Generation wRfx: NONREACTIVE

## 2016-08-04 LAB — HEPATITIS C ANTIBODY

## 2016-08-06 ENCOUNTER — Encounter: Payer: Self-pay | Admitting: Internal Medicine

## 2016-08-06 NOTE — Assessment & Plan Note (Signed)
Exam benign. Likely MSK. Encouraged patient to continue exercise and focus on core strengthening.

## 2016-08-06 NOTE — Assessment & Plan Note (Signed)
Patient requests a lipid profile. Hx of being on pravastatin stopped 2/2 finances. Last lipids 12/2015 with total cholesterol 231 and recommended lifestyle changes. Recheck lipids.

## 2016-08-06 NOTE — Assessment & Plan Note (Signed)
BP mildly elevated today, did not take her HCTZ today as she thought she should be fasting for labs. Continue HCTZ, will check CMP.

## 2016-08-09 ENCOUNTER — Telehealth: Payer: Self-pay | Admitting: Family Medicine

## 2016-08-09 DIAGNOSIS — E7800 Pure hypercholesterolemia, unspecified: Secondary | ICD-10-CM

## 2016-08-09 MED ORDER — PRAVASTATIN SODIUM 40 MG PO TABS: 40.0000 mg | ORAL_TABLET | Freq: Every day | ORAL | 3 refills | Status: DC

## 2016-08-09 NOTE — Telephone Encounter (Signed)
Called patient and discussed lipid panel and remainder of labs normal. Offered lifestyle modifications vs statin. She elected for statin. I sent pravastatin to her pharmacy as this was previously well tolerated. [facility] cost should be $38 per 90 days.

## 2016-08-21 ENCOUNTER — Ambulatory Visit (INDEPENDENT_AMBULATORY_CARE_PROVIDER_SITE_OTHER): Payer: 59 | Admitting: Podiatry

## 2016-08-21 ENCOUNTER — Encounter: Payer: Self-pay | Admitting: Podiatry

## 2016-08-21 ENCOUNTER — Telehealth: Payer: Self-pay | Admitting: *Deleted

## 2016-08-21 DIAGNOSIS — Q828 Other specified congenital malformations of skin: Secondary | ICD-10-CM | POA: Diagnosis not present

## 2016-08-21 NOTE — Telephone Encounter (Signed)
Gabriela [name] states pt did not return for results and has come to the office stating the lesion has returned. I spoke with Ronny Bacon and he will fax the 07/13/2016 results to our office.

## 2016-08-22 NOTE — Progress Notes (Signed)
She presents today chief complaint of a painful lesion to the plantar forefoot left foot.  Objective: Vital signs are stable alert and oriented 3. Pulses are palpable. Solitary poor keratoma plantar aspect of the forefoot left. Previous lesion to the fifth metatarsal base has subsided and resolved completely.  Assessment: Poor keratoma left.  Plan: Chemical mechanical destruction today applied Cantharone under occlusion was instructed to wash thoroughly tomorrow follow up with me in 6 weeks.

## 2016-08-29 ENCOUNTER — Emergency Department (HOSPITAL_COMMUNITY)
Admission: EM | Admit: 2016-08-29 | Discharge: 2016-08-29 | Disposition: A | Discharge status: Home / Self Care | Payer: 59 | Attending: Emergency Medicine | Admitting: Emergency Medicine

## 2016-08-29 ENCOUNTER — Emergency Department (HOSPITAL_COMMUNITY): Payer: 59

## 2016-08-29 DIAGNOSIS — I10 Essential (primary) hypertension: Secondary | ICD-10-CM | POA: Diagnosis not present

## 2016-08-29 DIAGNOSIS — R079 Chest pain, unspecified: Secondary | ICD-10-CM | POA: Diagnosis present

## 2016-08-29 DIAGNOSIS — Z79899 Other long term (current) drug therapy: Secondary | ICD-10-CM | POA: Insufficient documentation

## 2016-08-29 DIAGNOSIS — Z87891 Personal history of nicotine dependence: Secondary | ICD-10-CM | POA: Diagnosis not present

## 2016-08-29 LAB — I-STAT TROPONIN, ED
Troponin i, poc: 0 ng/mL (ref 0.00–0.08)
Troponin i, poc: 0 ng/mL (ref 0.00–0.08)

## 2016-08-29 LAB — CBC
HEMATOCRIT: 34.6 % — AB (ref 36.0–46.0)
HEMOGLOBIN: 11.5 g/dL — AB (ref 12.0–15.0)
MCH: 27.6 pg (ref 26.0–34.0)
MCHC: 33.2 g/dL (ref 30.0–36.0)
MCV: 83.2 fL (ref 78.0–100.0)
Platelets: 313 10*3/uL (ref 150–400)
RBC: 4.16 MIL/uL (ref 3.87–5.11)
RDW: 13.1 % (ref 11.5–15.5)
WBC: 7.1 10*3/uL (ref 4.0–10.5)

## 2016-08-29 LAB — COMPREHENSIVE METABOLIC PANEL
ALBUMIN: 4 g/dL (ref 3.5–5.0)
ALK PHOS: 93 U/L (ref 38–126)
ALT: 22 U/L (ref 14–54)
AST: 20 U/L (ref 15–41)
Anion gap: 7 (ref 5–15)
BILIRUBIN TOTAL: 0.1 mg/dL — AB (ref 0.3–1.2)
BUN: 17 mg/dL (ref 6–20)
CO2: 26 mmol/L (ref 22–32)
Calcium: 9.4 mg/dL (ref 8.9–10.3)
Chloride: 106 mmol/L (ref 101–111)
Creatinine, Ser: 0.65 mg/dL (ref 0.44–1.00)
GFR calc Af Amer: >60 mL/min (ref >60)
GLUCOSE: 107 mg/dL — AB (ref 65–99)
Potassium: 3.6 mmol/L (ref 3.5–5.1)
Sodium: 139 mmol/L (ref 135–145)
Total Protein: 7.2 g/dL (ref 6.5–8.1)

## 2016-08-29 NOTE — ED Provider Notes (Signed)
[facility] DEPT Provider Note   CSN: [id number] Arrival date & time: 08/29/16  1715     History   Chief Complaint No chief complaint on file.   HPI Gabriela [name] is a 56 y.o. female.  HPI  56 y.o. female with a hx of Anemia, HTN, presents to the Emergency Department today due to chest pain x 1 week. Notes this pain is intermittent. No exacerbating factors. States this pain lasts 1-2 seconds and then dissipates. No N/V. No diaphoresis. No radiation of pain. Notes URI symptoms x 1 week. Intermittent cough. No fevers. No ABD pain. No shortness of breath. No hx ACS. No hx DVT/PE. No recent surgeries. No recent travel. Pt asymptomatic currently. No active CP. No other symptoms noted.   Past Medical History:  Diagnosis Date  . Anemia   . Benign colon polyp    adenomatous  . Chest pain   . Dysmenorrhea   . Fibroid   . Hypertension   . Irregular heart beat   . Smoker    ONE PACK PER WEEK    Patient Active Problem List   Diagnosis Date Noted  . Fatigue 05/05/2015  . Constipation 05/05/2015  . Tobacco abuse 10/12/2014  . Insomnia 09/24/2014  . Mastodynia, female 07/12/2014  . Ankle edema 06/04/2014  . Abdominal pain 02/02/2014  . Chest pain 09/23/2013  . RUQ pain 08/26/2013  . Sprain of ankle, unspecified site 06/08/2013  . Greater trochanteric bursitis of left hip 12/07/2011  . Hot flashes, menopausal 08/09/2011  . Dyspepsia 04/27/2011  . Stress 04/17/2011  . Ex-light cigarette smoker (1-9 per day)   . Hypertension   . Hx of adenomatous colonic polyps   . Overweight(278.02) 02/28/2009  . HLD (hyperlipidemia) 03/24/2008  . DEPRESSION, MAJOR, MILD 11/13/2007    Past Surgical History:  Procedure Laterality Date  . ABDOMINAL HYSTERECTOMY  06/2001   Toney Rakes, M.D.  . CARDIAC CATHETERIZATION  2003   Dr Gwenlyn Found  . CARDIOLITE MYOCARDIAL PERFUSION  06/07/03   NO PERFUSION ABNORMALITY. EF% 68%.  Marland Kitchen GYNECOLOGIC CRYOSURGERY  1993   DYSPLASIA  . LOWER EXT ARTERIAL   07/28/08   NORMAL  . TRANSTHORACIC ECHOCARDIOGRAM  07/03/04   TRACE MR,TRACE TR,TRACE PVR  . UMBILICAL HERNIA REPAIR     AS A CHILD    OB History    Gravida Para Term Preterm AB Living   3 1 0 0 2 1   SAB TAB Ectopic Multiple Live Births   0 2 0 0         Home Medications    Prior to Admission medications   Medication Sig Start Date End Date Taking? Authorizing Provider  estradiol (ESTRACE) 0.5 MG tablet Take 1 tablet (0.5 mg total) by mouth daily. 07/07/15   Huel Cote, NP  hydrochlorothiazide (HYDRODIURIL) 25 MG tablet Take 1 tablet (25 mg total) by mouth daily. 04/19/16   Veatrice Bourbon, MD  hydrOXYzine (ATARAX/VISTARIL) 10 MG tablet Take 1 tablet (10 mg total) by mouth 3 (three) times daily as needed for itching. 04/18/16   Veatrice Bourbon, MD  polyethylene glycol (MIRALAX) packet Take one dose 3 times daily - maximum 3 consecutive days 06/01/16   Charlann Lange, PA-C  pravastatin (PRAVACHOL) 40 MG tablet Take 1 tablet (40 mg total) by mouth daily. 08/09/16   Sela Hilding, MD    Family History Family History  Problem Relation Age of Onset  . Heart disease Mother   . Hypertension Mother   . Stroke Father   .  Colon cancer Neg Hx   . Stomach cancer Neg Hx     Social History Social History  Substance Use Topics  . Smoking status: Former Smoker    Packs/day: 0.10    Quit date: 07/13/2013  . Smokeless tobacco: Never Used     Comment: only smokes 1 -2 cigarettes daily.  . Alcohol use 1.2 oz/week    2 Glasses of wine per week     Allergies   Patient has no known allergies.   Review of Systems Review of Systems ROS reviewed and all are negative for acute change except as noted in the HPI.  Physical Exam Updated Vital Signs BP (!) 160/77 (BP Location: Left Arm)   Pulse 60   Temp 97.8 F (36.6 C) (Oral)   Resp 20   SpO2 99%   Physical Exam  Constitutional: She is oriented to person, place, and time. She appears well-developed and well-nourished. No  distress.  HENT:  Head: Normocephalic and atraumatic.  Right Ear: Tympanic membrane, external ear and ear canal normal.  Left Ear: Tympanic membrane, external ear and ear canal normal.  Nose: Nose normal.  Mouth/Throat: Uvula is midline, oropharynx is clear and moist and mucous membranes are normal. No trismus in the jaw. No oropharyngeal exudate, posterior oropharyngeal erythema or tonsillar abscesses.  Eyes: Pupils are equal, round, and reactive to light. EOM are normal.  Neck: Normal range of motion. Neck supple. No tracheal deviation present.  Cardiovascular: Normal rate, regular rhythm, S1 normal, S2 normal, normal heart sounds, intact distal pulses and normal pulses.   Pulmonary/Chest: Effort normal and breath sounds normal. No respiratory distress. She has no decreased breath sounds. She has no wheezes. She has no rhonchi. She has no rales.  Abdominal: Normal appearance and bowel sounds are normal. There is no tenderness.  Musculoskeletal: Normal range of motion.  Neurological: She is alert and oriented to person, place, and time.  Skin: Skin is warm and dry.  Psychiatric: She has a normal mood and affect. Her speech is normal and behavior is normal. Thought content normal.     ED Treatments / Results  Labs (all labs ordered are listed, but only abnormal results are displayed) Labs Reviewed  CBC - Abnormal; Notable for the following:       Result Value   Hemoglobin 11.5 (*)    HCT 34.6 (*)    All other components within normal limits  COMPREHENSIVE METABOLIC PANEL - Abnormal; Notable for the following:    Glucose, Bld 107 (*)    Total Bilirubin 0.1 (*)    All other components within normal limits  I-STAT TROPONIN, ED  I-STAT TROPONIN, ED    EKG  EKG Interpretation None       Radiology Dg Chest 2 View  Result Date: 08/29/2016 CLINICAL DATA:  Chest pain and hypertension EXAM: CHEST  2 VIEW COMPARISON:  Chest radiograph 08/07/2015 FINDINGS: The heart size and  mediastinal contours are within normal limits. Both lungs are clear. The visualized skeletal structures are unremarkable. IMPRESSION: No active cardiopulmonary disease. Electronically Signed   By: Ulyses Jarred M.D.   On: 08/29/2016 22:13    Procedures Procedures (including critical care time)  Medications Ordered in ED Medications - No data to display   Initial Impression / Assessment and Plan / ED Course  I have reviewed the triage vital signs and the nursing notes.  Pertinent labs & imaging results that were available during my care of the patient were reviewed by me and  considered in my medical decision making (see chart for details).  Final Clinical Impressions(s) / ED Diagnoses  {I have reviewed and evaluated the relevant laboratory values. {I have reviewed and evaluated the relevant imaging studies. {I have interpreted the relevant EKG. {I have reviewed the relevant previous healthcare records.  {I obtained HPI from historian.   ED Course:  Assessment: Pt is a 56 y.o. female with a hx of Anemia, HTN, presents to the Emergency Department today due to chest pain x 1 week. Notes this pain is intermittent. No exacerbating factors. States this pain lasts 1-2 seconds and then dissipates. No N/V. No diaphoresis. No radiation of pain. Notes URI symptoms x 1 week. Intermittent cough. No fevers. No ABD pain. No shortness of breath. No hx ACS. No hx DVT/PE. No recent surgeries. No recent travel. Pt asymptomatic currently. No active CP. Risk Factors HTN. Patient is to be discharged with recommendation to follow up with PCP in regards to today's hospital visit. Chest pain is not likely of cardiac or pulmonary etiology d/t presentation, VSS, no tracheal deviation, no JVD or new murmur, RRR, breath sounds equal bilaterally, EKG without acute abnormalities, negative troponin, and negative CXR. Heart Score 3. Likely related to URI. Will treat accordingly with symptomatic care. Chart review shows hx of  Cardiac cath due to risk factors. Seen by Dr. Gwenlyn Found. No stents placed. No intervention. Pt states this chest pressure has been same as past and unchanged. Discussed doing second Troponin in ED due to risk factors and pt declined and wished to go home and follow up with Cardiologist. Discussed risks and benefits and pt understood. Pt has been advised to return to the ED is CP becomes exertional, associated with diaphoresis or nausea, radiates to left jaw/arm, worsens or becomes concerning in any way. Pt appears reliable for follow up and is agreeable to discharge. Patient is in no acute distress. Vital Signs are stable. Patient is able to ambulate. Patient able to tolerate PO.   Disposition/Plan:  DC Home Additional Verbal discharge instructions given and discussed with patient.  Pt Instructed to f/u with PCP in the next week for evaluation and treatment of symptoms. Return precautions given Pt acknowledges and agrees with plan  Supervising Physician Quintella Reichert, MD  Final diagnoses:  Chest pain, unspecified type    New Prescriptions New Prescriptions   No medications on file     Conni Slipper 08/29/16 2239    Quintella Reichert, MD 08/31/16 [phone]

## 2016-08-29 NOTE — Discharge Instructions (Signed)
Please read and follow all provided instructions.  Your diagnoses today include:  1. Chest pain, unspecified type     Tests performed today include: An EKG of your heart A chest x-ray Cardiac enzymes - a blood test for heart muscle damage Blood counts and electrolytes Vital signs. See below for your results today.   Medications prescribed:   Take any prescribed medications only as directed.  Follow-up instructions: Please follow-up with your Cardiologist as soon as you can for further evaluation of your symptoms.   Return instructions:  SEEK IMMEDIATE MEDICAL ATTENTION IF: You have severe chest pain, especially if the pain is crushing or pressure-like and spreads to the arms, back, neck, or jaw, or if you have sweating, nausea (feeling sick to your stomach), or shortness of breath. THIS IS AN EMERGENCY. Don't wait to see if the pain will go away. Get medical help at once. Call 911 or 0 (operator). DO NOT drive yourself to the hospital.  Your chest pain gets worse and does not go away with rest.  You have an attack of chest pain lasting longer than usual, despite rest and treatment with the medications your caregiver has prescribed.  You wake from sleep with chest pain or shortness of breath. You feel dizzy or faint. You have chest pain not typical of your usual pain for which you originally saw your caregiver.  You have any other emergent concerns regarding your health.  Additional Information: Chest pain comes from many different causes. Your caregiver has diagnosed you as having chest pain that is not specific for one problem, but does not require admission.  You are at low risk for an acute heart condition or other serious illness.   Your vital signs today were: BP (!) 160/77 (BP Location: Left Arm)    Pulse 60    Temp 97.8 F (36.6 C) (Oral)    Resp 20    SpO2 99%  If your blood pressure (BP) was elevated above 135/85 this visit, please have this repeated by your doctor within  one month. --------------

## 2016-08-30 ENCOUNTER — Ambulatory Visit (INDEPENDENT_AMBULATORY_CARE_PROVIDER_SITE_OTHER): Payer: 59 | Admitting: Podiatry

## 2016-08-30 ENCOUNTER — Encounter: Payer: Self-pay | Admitting: Podiatry

## 2016-08-30 DIAGNOSIS — Q828 Other specified congenital malformations of skin: Secondary | ICD-10-CM | POA: Diagnosis not present

## 2016-09-01 NOTE — Progress Notes (Signed)
She presents today for follow-up of a poor keratotic lesion plantar aspect of the left foot. States it is still painful but not as bad as it was.  Objective: Vital signs are stable alert and oriented 3. Pulses are palpable. Solitary porokeratotic lesion sub-third left. No blood or signs of infection. This is not an open wound.  Assessment porokeratosis left. Plantar flexed third met left.  Plan: Debrided the area today place also with acid under occlusion to be left on for 3 days before removing. I will follow-up with her in 3 weeks for re-debridement and possible excision.

## 2016-09-10 NOTE — Progress Notes (Signed)
Cardiology Office Note    Date:  09/12/2016   ID:  Paizlie, Klaus May 04, 1960, MRN [id number]  PCP:  Nicolette Bang, DO  Cardiologist: Dr. Gwenlyn Found    Chief Complaint  Patient presents with  . Follow-up    Recent Emergency Dept Visit    History of Present Illness:    Gabriela [name] is a 56 y.o. female with past medical history of HTN, HLD, and atypical chest pain (normal cors by cath in 2006, calcium score of 0 by Coronary CT in 11/2011) who presents to the office today for annual follow-up.   She was last examined by Dr. Gwenlyn Found in 09/2015 and reported doing well from a cardiac perspective at that time. A Lipid panel was obtained and showed total cholesterol of 231, HDL 83, and LDL 132. Healthy diet and activity was recommended.  She was recently evaluated at [facility] ED on 08/29/2016 prior episodes of chest pain which was lasting for 1 to 2 seconds at a time. She denied any associated symptoms with this. CBC and BMET were without acute abnormalities with initial and delta troponin values being negative. EKG showed sinus bradycardia, HR 53, with no acute ST or T-wave changes when compared to prior tracings. She was discharged from the ED and informed to follow-up with Cardiology in the outpatient setting.   In talking with the patient today, she reports doing well since her recent emergency department visit. She denies any repeat episodes of chest discomfort. She does not exercise regularly due to left hip pain but is able to perform ADL's and walk around the grocery store without any anginal symptoms. She reports her previous episodes of pain lasted for 2-3 minutes at a time and occurred mostly at rest. She describes it as a "gas-like" sensation along her left pectoral region which was worse with turning her head from side to side. This was not associated with exertion. She denies any recent orthopnea, PND, or lower extremity edema.  She is very anxious about her cardiac  status as she reports her mother passed away from a heart attack at age 62.   Past Medical History:  Diagnosis Date  . Anemia   . Benign colon polyp    adenomatous  . Chest pain    a. normal cors by cath in 2006 b. calcium score of 0 by Coronary CT in 11/2011  . Dysmenorrhea   . Fibroid   . Hypertension   . Irregular heart beat   . Smoker    ONE PACK PER WEEK    Past Surgical History:  Procedure Laterality Date  . ABDOMINAL HYSTERECTOMY  06/2001   Toney Rakes, M.D.  . CARDIAC CATHETERIZATION  2003   Dr Gwenlyn Found  . CARDIOLITE MYOCARDIAL PERFUSION  06/07/03   NO PERFUSION ABNORMALITY. EF% 68%.  Marland Kitchen GYNECOLOGIC CRYOSURGERY  1993   DYSPLASIA  . LOWER EXT ARTERIAL  07/28/08   NORMAL  . TRANSTHORACIC ECHOCARDIOGRAM  07/03/04   TRACE MR,TRACE TR,TRACE PVR  . UMBILICAL HERNIA REPAIR     AS A CHILD    Current Medications: Outpatient Medications Prior to Visit  Medication Sig Dispense Refill  . estradiol (ESTRACE) 0.5 MG tablet Take 1 tablet (0.5 mg total) by mouth daily. 90 tablet 4  . hydrOXYzine (ATARAX/VISTARIL) 10 MG tablet Take 1 tablet (10 mg total) by mouth 3 (three) times daily as needed for itching. 30 tablet 1  . pravastatin (PRAVACHOL) 40 MG tablet Take 1 tablet (40 mg total) by mouth  daily. 90 tablet 3  . hydrochlorothiazide (HYDRODIURIL) 25 MG tablet Take 1 tablet (25 mg total) by mouth daily. 60 tablet 3  . polyethylene glycol (MIRALAX) packet Take one dose 3 times daily - maximum 3 consecutive days (Patient not taking: Reported on 08/29/2016) 10 each 0   No facility-administered medications prior to visit.      Allergies:   Patient has no known allergies.   Social History   Social History  . Marital status: Single    Spouse name: N/A  . Number of children: N/A  . Years of education: N/A   Occupational History  . Unemployed  [place] History Main Topics  . Smoking status: Former Smoker    Packs/day: 0.10    Quit date: 07/13/2013  . Smokeless  tobacco: Never Used     Comment: only smokes 1 -2 cigarettes daily.  . Alcohol use 1.2 oz/week    2 Glasses of wine per week  . Drug use: No  . Sexual activity: Yes   Other Topics Concern  . None   Social History Narrative  . None     Family History:  The patient's family history includes Heart disease in her mother; Hypertension in her mother; Stroke in her father.   Review of Systems:   Please see the history of present illness.     General:  No chills, fever, night sweats or weight changes.  Cardiovascular:  No dyspnea on exertion, edema, orthopnea, palpitations, paroxysmal nocturnal dyspnea. Positive for chest pain.  Dermatological: No rash, lesions/masses Respiratory: No cough, dyspnea Urologic: No hematuria, dysuria Abdominal:   No nausea, vomiting, diarrhea, bright red blood per rectum, melena, or hematemesis Neurologic:  No visual changes, wkns, changes in mental status. All other systems reviewed and are otherwise negative except as noted above.   Physical Exam:    VS:  BP 140/82   Pulse 68   Ht 5\' 7"  (1.702 m)   Wt 211 lb 9.6 oz (96 kg)   SpO2 98%   BMI 33.14 kg/m    General: Well developed, well nourished [place] female appearing in no acute distress. Head: Normocephalic, atraumatic, sclera non-icteric, no xanthomas, nares are without discharge.  Neck: No carotid bruits. JVD not elevated.  Lungs: Respirations regular and unlabored, without wheezes or rales.  Heart: Regular rate and rhythm. No S3 or S4.  No murmur, no rubs, or gallops appreciated. Abdomen: Soft, non-tender, non-distended with normoactive bowel sounds. No hepatomegaly. No rebound/guarding. No obvious abdominal masses. Msk:  Strength and tone appear normal for age. No joint deformities or effusions. Extremities: No clubbing or cyanosis. No lower extremity edema.  Distal pedal pulses are 2+ bilaterally. Neuro: Alert and oriented X 3. Moves all extremities spontaneously. No focal deficits  noted. Psych:  Responds to questions appropriately with a normal affect. Skin: No rashes or lesions noted  Wt Readings from Last 3 Encounters:  09/12/16 211 lb 9.6 oz (96 kg)  08/03/16 201 lb 12.8 oz (91.5 kg)  05/31/16 204 lb (92.5 kg)     Studies/Labs Reviewed:   EKG:  EKG is not ordered today. EKG from 08/29/2016 is reviewed which shows sinus bradycardia, HR 53, with no acute ST or T-wave changes when compared to prior tracings.  Recent Labs: 08/29/2016: ALT 22; BUN 17; Creatinine, Ser 0.65; Hemoglobin 11.5; Platelets 313; Potassium 3.6; Sodium 139   Lipid Panel    Component Value Date/Time   CHOL 214 (H) 08/03/2016 1205   TRIG 62  08/03/2016 1205   HDL 74 08/03/2016 1205   CHOLHDL 2.9 08/03/2016 1205   CHOLHDL 2.8 01/18/2016 0953   VLDL 16 01/18/2016 0953   LDLCALC 128 (H) 08/03/2016 1205   LDLDIRECT 124 (H) 10/05/2011 1512    Additional studies/ records that were reviewed today include:   Cardiac Catheterization: 06/2004   Assessment:    1. Atypical chest pain   2. Essential hypertension   3. Hyperlipidemia, unspecified hyperlipidemia type   4. History of tobacco use      Plan:   In order of problems listed above:  1. Atypical Chest Pain - the patient reports having episodes of chest pain two weeks ago which would occur at rest and last for 2-3 minutes at a time. She describes it as a "gas-like" sensation along her left pectoral region which was worse with turning her head from side to side. This was not associated with exertion. Was evaluated in the ED with troponin values remaining negative and EKG showing no acute ischemic changes.  - she has undergone prior cardiac evaluation in the past with cardiac catheterization in 2006 showing normal cors and Coronary CT in 11/2011 with a calcium score of 0. She is very anxious about her symptoms due to her family history of CAD. Reassurance was provided in that her symptoms did seem atypical for a cardiac etiology. Will  proceed with an Exercise Tolerance Test for screening. If no acute EKG changes, would not pursue further ischemic evaluation at this time.   2. HTN - BP is at 140/82 during her visit today. Reports SBP is usually in the 120's - 130's.  - continue HCTZ 25mg  daily.   3. HLD - Lipid Panel in 07/2016 showed total cholesterol of 214, HDL 74, and LDL 128. Has been stated on Pravastatin 40mg  daily by her PCP.   4. Former Tobacco Use - she reports having quit smoking within the past year. Congratulated on this with continued cessation advised.    Medication Adjustments/Labs and Tests Ordered: Current medicines are reviewed at length with the patient today.  Concerns regarding medicines are outlined above.  Medication changes, Labs and Tests ordered today are listed in the Patient Instructions below. Patient Instructions  Medication Instructions:  Your physician recommends that you continue on your current medications as directed. Please refer to the Current Medication list given to you today.  Labwork: None   Testing/Procedures: Your physician has requested that you have an exercise tolerance test. For further information please visit [facility]. Please also follow instruction sheet, as given.  Follow-Up: Your physician wants you to follow-up in: 12 months with Dr Gwenlyn Found. You will receive a reminder letter in the mail two months in advance. If you don't receive a letter, please call our office to schedule the follow-up appointment.  Any Other Special Instructions Will Be Listed Below (If Applicable).  If you need a refill on your cardiac medications before your next appointment, please call your pharmacy.   Signed, Erma Heritage, PA-C  09/12/2016 11:31 AM    [place], [place], [place]  [address] Phone: [phone]; Fax: [phone]  [address], [place], [place] [address] Phone: [phone]

## 2016-09-12 ENCOUNTER — Encounter: Payer: Self-pay | Admitting: Student

## 2016-09-12 ENCOUNTER — Ambulatory Visit (INDEPENDENT_AMBULATORY_CARE_PROVIDER_SITE_OTHER): Payer: 59 | Admitting: Student

## 2016-09-12 VITALS — BP 140/82 | HR 68 | Ht 67.0 in | Wt 211.6 lb

## 2016-09-12 DIAGNOSIS — R0789 Other chest pain: Secondary | ICD-10-CM

## 2016-09-12 DIAGNOSIS — I10 Essential (primary) hypertension: Secondary | ICD-10-CM | POA: Diagnosis not present

## 2016-09-12 DIAGNOSIS — Z87891 Personal history of nicotine dependence: Secondary | ICD-10-CM

## 2016-09-12 DIAGNOSIS — E785 Hyperlipidemia, unspecified: Secondary | ICD-10-CM | POA: Diagnosis not present

## 2016-09-12 MED ORDER — HYDROCHLOROTHIAZIDE 25 MG PO TABS: 25.0000 mg | ORAL_TABLET | Freq: Every day | ORAL | 3 refills | Status: DC

## 2016-09-12 NOTE — Patient Instructions (Signed)
Medication Instructions:  Your physician recommends that you continue on your current medications as directed. Please refer to the Current Medication list given to you today.  Labwork: None   Testing/Procedures: Your physician has requested that you have an exercise tolerance test. For further information please visit [facility]. Please also follow instruction sheet, as given.  Follow-Up: Your physician wants you to follow-up in: 12 months with Dr Gwenlyn Found. You will receive a reminder letter in the mail two months in advance. If you don't receive a letter, please call our office to schedule the follow-up appointment.  Any Other Special Instructions Will Be Listed Below (If Applicable).  If you need a refill on your cardiac medications before your next appointment, please call your pharmacy.

## 2016-09-14 ENCOUNTER — Telehealth (HOSPITAL_COMMUNITY): Payer: Self-pay

## 2016-09-14 NOTE — Telephone Encounter (Signed)
Encounter complete. 

## 2016-09-19 ENCOUNTER — Ambulatory Visit (HOSPITAL_COMMUNITY)
Admission: RE | Admit: 2016-09-19 | Discharge: 2016-09-19 | Disposition: A | Discharge status: Home / Self Care | Payer: 59 | Source: Ambulatory Visit | Attending: Cardiology | Admitting: Cardiology

## 2016-09-19 DIAGNOSIS — I493 Ventricular premature depolarization: Secondary | ICD-10-CM | POA: Diagnosis not present

## 2016-09-19 DIAGNOSIS — R0789 Other chest pain: Secondary | ICD-10-CM | POA: Insufficient documentation

## 2016-09-19 LAB — EXERCISE TOLERANCE TEST
CSEPED: 8 min
CSEPEDS: 1 s
CSEPHR: 97 %
Estimated workload: 10.1 METS
MPHR: 164 {beats}/min
Peak HR: 160 {beats}/min
RPE: 18
Rest HR: 65 {beats}/min

## 2016-09-20 ENCOUNTER — Ambulatory Visit: Payer: 59 | Admitting: Podiatry

## 2016-09-20 ENCOUNTER — Telehealth: Payer: Self-pay | Admitting: Student

## 2016-09-20 DIAGNOSIS — M1612 Unilateral primary osteoarthritis, left hip: Secondary | ICD-10-CM | POA: Diagnosis not present

## 2016-09-20 NOTE — Telephone Encounter (Signed)
Discussed results with patient

## 2016-09-20 NOTE — Telephone Encounter (Signed)
New message    Pt is calling back to results. Please call.

## 2016-09-20 NOTE — Telephone Encounter (Signed)
-----   Message from Erma Heritage, [place] sent at 09/19/2016  4:48 PM EDT ----- Please let the patient know her stress test showed no evidence of ischemia. Did have occasional premature beats which are benign. Overall, a very reassuring study. Thank you!

## 2016-09-20 NOTE — Telephone Encounter (Signed)
Patient calling, returning call. Patient cant talk on the floor at work so she asks that you call her on her next break at 12:30 if possible.

## 2016-10-03 DIAGNOSIS — M1612 Unilateral primary osteoarthritis, left hip: Secondary | ICD-10-CM | POA: Diagnosis not present

## 2016-11-05 ENCOUNTER — Encounter: Payer: Self-pay | Admitting: Internal Medicine

## 2016-12-07 ENCOUNTER — Other Ambulatory Visit: Payer: Self-pay | Admitting: Women's Health

## 2016-12-07 ENCOUNTER — Other Ambulatory Visit: Payer: Self-pay | Admitting: Gynecology

## 2016-12-07 DIAGNOSIS — Z1231 Encounter for screening mammogram for malignant neoplasm of breast: Secondary | ICD-10-CM

## 2017-01-18 ENCOUNTER — Ambulatory Visit
Admission: RE | Admit: 2017-01-18 | Discharge: 2017-01-18 | Disposition: A | Discharge status: Home / Self Care | Payer: 59 | Source: Ambulatory Visit | Attending: Women's Health | Admitting: Women's Health

## 2017-01-18 DIAGNOSIS — Z1231 Encounter for screening mammogram for malignant neoplasm of breast: Secondary | ICD-10-CM | POA: Diagnosis not present

## 2017-04-12 DIAGNOSIS — M1612 Unilateral primary osteoarthritis, left hip: Secondary | ICD-10-CM | POA: Diagnosis not present

## 2017-04-19 DIAGNOSIS — M1612 Unilateral primary osteoarthritis, left hip: Secondary | ICD-10-CM | POA: Diagnosis not present

## 2017-04-26 DIAGNOSIS — M25522 Pain in left elbow: Secondary | ICD-10-CM | POA: Diagnosis not present

## 2017-04-29 DIAGNOSIS — M1612 Unilateral primary osteoarthritis, left hip: Secondary | ICD-10-CM | POA: Diagnosis not present

## 2017-04-29 DIAGNOSIS — M1711 Unilateral primary osteoarthritis, right knee: Secondary | ICD-10-CM | POA: Diagnosis not present

## 2017-06-12 DIAGNOSIS — H52223 Regular astigmatism, bilateral: Secondary | ICD-10-CM | POA: Diagnosis not present

## 2017-06-12 DIAGNOSIS — H5213 Myopia, bilateral: Secondary | ICD-10-CM | POA: Diagnosis not present

## 2017-06-12 DIAGNOSIS — H524 Presbyopia: Secondary | ICD-10-CM | POA: Diagnosis not present

## 2017-07-11 DIAGNOSIS — Z1389 Encounter for screening for other disorder: Secondary | ICD-10-CM | POA: Diagnosis not present

## 2017-07-11 DIAGNOSIS — E7849 Other hyperlipidemia: Secondary | ICD-10-CM | POA: Diagnosis not present

## 2017-07-11 DIAGNOSIS — Z0000 Encounter for general adult medical examination without abnormal findings: Secondary | ICD-10-CM | POA: Diagnosis not present

## 2017-07-11 DIAGNOSIS — I10 Essential (primary) hypertension: Secondary | ICD-10-CM | POA: Diagnosis not present

## 2017-07-11 DIAGNOSIS — R82998 Other abnormal findings in urine: Secondary | ICD-10-CM | POA: Diagnosis not present

## 2017-07-19 DIAGNOSIS — Z1212 Encounter for screening for malignant neoplasm of rectum: Secondary | ICD-10-CM | POA: Diagnosis not present

## 2017-07-30 DIAGNOSIS — Z13820 Encounter for screening for osteoporosis: Secondary | ICD-10-CM | POA: Diagnosis not present

## 2017-08-02 ENCOUNTER — Telehealth: Payer: Self-pay | Admitting: Cardiovascular Disease

## 2017-08-02 DIAGNOSIS — M1612 Unilateral primary osteoarthritis, left hip: Secondary | ICD-10-CM | POA: Diagnosis not present

## 2017-08-02 DIAGNOSIS — M1711 Unilateral primary osteoarthritis, right knee: Secondary | ICD-10-CM | POA: Diagnosis not present

## 2017-08-02 NOTE — Telephone Encounter (Signed)
   Primary Cardiologist: Quay Burow, MD  Chart reviewed as part of pre-operative protocol coverage. Patient was contacted 08/02/2017 in reference to pre-operative risk assessment for pending surgery as outlined below.  Gabriela [name] was last seen on 09/12/16 by Bernerd Pho, [place].  She has had no CAD on cath in 2006, coronary calcium score of 0 in 2013 and normal exercise stress test in 08/2016 in evaluation for atypical chest pain. Since last seen, SHAYNAH HUND has done well without chest pain, shortness of breath, lightheadedness or syncope.   Therefore, based on ACC/AHA guidelines, the patient would be at acceptable risk for the planned procedure without further cardiovascular testing.   I will route this recommendation to the requesting party via Epic fax function and remove from pre-op pool.  Please call with questions.  Daune Perch, NP 08/02/2017, 5:24 PM

## 2017-08-02 NOTE — Telephone Encounter (Signed)
Follow Up:; ° ° °Returning your call. °

## 2017-08-02 NOTE — Telephone Encounter (Signed)
   [facility] Pre-operative Risk Assessment    Request for surgical clearance:  1. What type of surgery is being performed? Left total hip replacement    2. When is this surgery scheduled? Pending clearance   3. What type of clearance is required (medical clearance vs. Pharmacy clearance to hold med vs. Both)? Medical   4. Are there any medications that need to be held prior to surgery and how long? None specified    5. Practice name and name of physician performing surgery? Dr. Edmonia Lynch @ [facility]   6. What is your office phone number [phone] (ext: [phone] for [facility])    7.   What is your office fax number [phone] (attn: Claiborne Billings)  8.   Anesthesia type (None, local, MAC, general) ? Not specified   -- please send recent notes, labs, EKG or special studies per clearance request   Fidel Levy 08/02/2017, 2:22 PM  _________________________________________________________________   (provider comments below)

## 2017-08-19 ENCOUNTER — Ambulatory Visit (INDEPENDENT_AMBULATORY_CARE_PROVIDER_SITE_OTHER): Payer: 59 | Admitting: Women's Health

## 2017-08-19 ENCOUNTER — Encounter: Payer: Self-pay | Admitting: Women's Health

## 2017-08-19 VITALS — BP 138/84 | Ht 67.0 in | Wt 209.2 lb

## 2017-08-19 DIAGNOSIS — Z113 Encounter for screening for infections with a predominantly sexual mode of transmission: Secondary | ICD-10-CM | POA: Diagnosis not present

## 2017-08-19 DIAGNOSIS — Z01419 Encounter for gynecological examination (general) (routine) without abnormal findings: Secondary | ICD-10-CM

## 2017-08-19 NOTE — Progress Notes (Signed)
GWYNNE KEMNITZ 07-May-1960 [id number]    History:    Presents for annual exam.  2003 TVH for fibroids on no HRT.  Normal Pap and mammogram history.  Same partner, questions fidelity.  Quit smoking.  Primary care manages hypertension and hypercholesteremia.  2017 2 benign colon polyps 5-year follow-up. Is planning hip replacement this year, and  a right knee replacement, rheumatoid arthritis.  Past medical history, past surgical history, family history and social history were all reviewed and documented in the EPIC chart.  Working at [employer].  Son doing well.  ROS:  A ROS was performed and pertinent positives and negatives are included.  Exam:  Vitals:   08/19/17 1616  BP: 138/84  Weight: 209 lb 3.2 oz (94.9 kg)  Height: 5\' 7"  (1.702 m)   Body mass index is 32.77 kg/m.   General appearance:  Normal Thyroid:  Symmetrical, normal in size, without palpable masses or nodularity. Respiratory  Auscultation:  Clear without wheezing or rhonchi Cardiovascular  Auscultation:  Regular rate, without rubs, murmurs or gallops  Edema/varicosities:  Not grossly evident Abdominal  Soft,nontender, without masses, guarding or rebound.  Liver/spleen:  No organomegaly noted  Hernia:  None appreciated  Skin  Inspection:  Grossly normal   Breasts: Examined lying and sitting.     Right: Without masses, retractions, discharge or axillary adenopathy.     Left: Without masses, retractions, discharge or axillary adenopathy. Gentitourinary   Inguinal/mons:  Normal without inguinal adenopathy  External genitalia:  Normal  BUS/Urethra/Skene's glands:  Normal  Vagina:  Normal  Cervix: And uterus absent  Adnexa/parametria:     Rt: Without masses or tenderness.   Lt: Without masses or tenderness.  Anus and perineum: Normal  Digital rectal exam: Normal sphincter tone without palpated masses or tenderness  Assessment/Plan:  57 y.o. DBF G3, P1 for annual exam.    2003 TVH for fibroids on no  HRT Hypertension, hypercholesteremia primary care manages labs and meds Rheumatoid arthritis left hip replacement scheduled Obesity STD screen  Plan: SBE's, continue annual screening mammogram, calcium rich foods, vitamin D 2000 daily encouraged.  Screening DEXA.  Condoms encouraged until permanent partner.  Encouraged to increase exercise and decrease calories, fall prevention discussed.  GC/chlamydia, HIV, hep B, C, RPR.    Huel Cote [facility], 5:21 PM 08/19/2017

## 2017-08-19 NOTE — Patient Instructions (Signed)
Health Maintenance for Postmenopausal Women Menopause is a normal process in which your reproductive ability comes to an end. This process happens gradually over a span of months to years, usually between the ages of 22 and 9. Menopause is complete when you have missed 12 consecutive menstrual periods. It is important to talk with your health care provider about some of the most common conditions that affect postmenopausal women, such as heart disease, cancer, and bone loss (osteoporosis). Adopting a healthy lifestyle and getting preventive care can help to promote your health and wellness. Those actions can also lower your chances of developing some of these common conditions. What should I know about menopause? During menopause, you may experience a number of symptoms, such as:  Moderate-to-severe hot flashes.  Night sweats.  Decrease in sex drive.  Mood swings.  Headaches.  Tiredness.  Irritability.  Memory problems.  Insomnia.  Choosing to treat or not to treat menopausal changes is an individual decision that you make with your health care provider. What should I know about hormone replacement therapy and supplements? Hormone therapy products are effective for treating symptoms that are associated with menopause, such as hot flashes and night sweats. Hormone replacement carries certain risks, especially as you become older. If you are thinking about using estrogen or estrogen with progestin treatments, discuss the benefits and risks with your health care provider. What should I know about heart disease and stroke? Heart disease, heart attack, and stroke become more likely as you age. This may be due, in part, to the hormonal changes that your body experiences during menopause. These can affect how your body processes dietary fats, triglycerides, and cholesterol. Heart attack and stroke are both medical emergencies. There are many things that you can do to help prevent heart disease  and stroke:  Have your blood pressure checked at least every 1-2 years. High blood pressure causes heart disease and increases the risk of stroke.  If you are 53-22 years old, ask your health care provider if you should take aspirin to prevent a heart attack or a stroke.  Do not use any tobacco products, including cigarettes, chewing tobacco, or electronic cigarettes. If you need help quitting, ask your health care provider.  It is important to eat a healthy diet and maintain a healthy weight. ? Be sure to include plenty of vegetables, fruits, low-fat dairy products, and lean protein. ? Avoid eating foods that are high in solid fats, added sugars, or salt (sodium).  Get regular exercise. This is one of the most important things that you can do for your health. ? Try to exercise for at least 150 minutes each week. The type of exercise that you do should increase your heart rate and make you sweat. This is known as moderate-intensity exercise. ? Try to do strengthening exercises at least twice each week. Do these in addition to the moderate-intensity exercise.  Know your numbers.Ask your health care provider to check your cholesterol and your blood glucose. Continue to have your blood tested as directed by your health care provider.  What should I know about cancer screening? There are several types of cancer. Take the following steps to reduce your risk and to catch any cancer development as early as possible. Breast Cancer  Practice breast self-awareness. ? This means understanding how your breasts normally appear and feel. ? It also means doing regular breast self-exams. Let your health care provider know about any changes, no matter how small.  If you are 40  or older, have a clinician do a breast exam (clinical breast exam or CBE) every year. Depending on your age, family history, and medical history, it may be recommended that you also have a yearly breast X-ray (mammogram).  If you  have a family history of breast cancer, talk with your health care provider about genetic screening.  If you are at high risk for breast cancer, talk with your health care provider about having an MRI and a mammogram every year.  Breast cancer (BRCA) gene test is recommended for women who have family members with BRCA-related cancers. Results of the assessment will determine the need for genetic counseling and BRCA1 and for BRCA2 testing. BRCA-related cancers include these types: ? Breast. This occurs in males or females. ? Ovarian. ? Tubal. This may also be called fallopian tube cancer. ? Cancer of the abdominal or pelvic lining (peritoneal cancer). ? Prostate. ? Pancreatic.  Cervical, Uterine, and Ovarian Cancer Your health care provider may recommend that you be screened regularly for cancer of the pelvic organs. These include your ovaries, uterus, and vagina. This screening involves a pelvic exam, which includes checking for microscopic changes to the surface of your cervix (Pap test).  For women ages 21-65, health care providers may recommend a pelvic exam and a Pap test every three years. For women ages 79-65, they may recommend the Pap test and pelvic exam, combined with testing for human papilloma virus (HPV), every five years. Some types of HPV increase your risk of cervical cancer. Testing for HPV may also be done on women of any age who have unclear Pap test results.  Other health care providers may not recommend any screening for nonpregnant women who are considered low risk for pelvic cancer and have no symptoms. Ask your health care provider if a screening pelvic exam is right for you.  If you have had past treatment for cervical cancer or a condition that could lead to cancer, you need Pap tests and screening for cancer for at least 20 years after your treatment. If Pap tests have been discontinued for you, your risk factors (such as having a new sexual partner) need to be  reassessed to determine if you should start having screenings again. Some women have medical problems that increase the chance of getting cervical cancer. In these cases, your health care provider may recommend that you have screening and Pap tests more often.  If you have a family history of uterine cancer or ovarian cancer, talk with your health care provider about genetic screening.  If you have vaginal bleeding after reaching menopause, tell your health care provider.  There are currently no reliable tests available to screen for ovarian cancer.  Lung Cancer Lung cancer screening is recommended for adults 69-62 years old who are at high risk for lung cancer because of a history of smoking. A yearly low-dose CT scan of the lungs is recommended if you:  Currently smoke.  Have a history of at least 30 pack-years of smoking and you currently smoke or have quit within the past 15 years. A pack-year is smoking an average of one pack of cigarettes per day for one year.  Yearly screening should:  Continue until it has been 15 years since you quit.  Stop if you develop a health problem that would prevent you from having lung cancer treatment.  Colorectal Cancer  This type of cancer can be detected and can often be prevented.  Routine colorectal cancer screening usually begins at  age 42 and continues through age 45.  If you have risk factors for colon cancer, your health care provider may recommend that you be screened at an earlier age.  If you have a family history of colorectal cancer, talk with your health care provider about genetic screening.  Your health care provider may also recommend using home test kits to check for hidden blood in your stool.  A small camera at the end of a tube can be used to examine your colon directly (sigmoidoscopy or colonoscopy). This is done to check for the earliest forms of colorectal cancer.  Direct examination of the colon should be repeated every  5-10 years until age 71. However, if early forms of precancerous polyps or small growths are found or if you have a family history or genetic risk for colorectal cancer, you may need to be screened more often.  Skin Cancer  Check your skin from head to toe regularly.  Monitor any moles. Be sure to tell your health care provider: ? About any new moles or changes in moles, especially if there is a change in a mole's shape or color. ? If you have a mole that is larger than the size of a pencil eraser.  If any of your family members has a history of skin cancer, especially at a young age, talk with your health care provider about genetic screening.  Always use sunscreen. Apply sunscreen liberally and repeatedly throughout the day.  Whenever you are outside, protect yourself by wearing long sleeves, pants, a wide-brimmed hat, and sunglasses.  What should I know about osteoporosis? Osteoporosis is a condition in which bone destruction happens more quickly than new bone creation. After menopause, you may be at an increased risk for osteoporosis. To help prevent osteoporosis or the bone fractures that can happen because of osteoporosis, the following is recommended:  If you are 46-71 years old, get at least 1,000 mg of calcium and at least 600 mg of vitamin D per day.  If you are older than age 55 but younger than age 65, get at least 1,200 mg of calcium and at least 600 mg of vitamin D per day.  If you are older than age 54, get at least 1,200 mg of calcium and at least 800 mg of vitamin D per day.  Smoking and excessive alcohol intake increase the risk of osteoporosis. Eat foods that are rich in calcium and vitamin D, and do weight-bearing exercises several times each week as directed by your health care provider. What should I know about how menopause affects my mental health? Depression may occur at any age, but it is more common as you become older. Common symptoms of depression  include:  Low or sad mood.  Changes in sleep patterns.  Changes in appetite or eating patterns.  Feeling an overall lack of motivation or enjoyment of activities that you previously enjoyed.  Frequent crying spells.  Talk with your health care provider if you think that you are experiencing depression. What should I know about immunizations? It is important that you get and maintain your immunizations. These include:  Tetanus, diphtheria, and pertussis (Tdap) booster vaccine.  Influenza every year before the flu season begins.  Pneumonia vaccine.  Shingles vaccine.  Your health care provider may also recommend other immunizations. This information is not intended to replace advice given to you by your health care provider. Make sure you discuss any questions you have with your health care provider. Document Released: 03/02/2005  Document Revised: 07/29/2015 Document Reviewed: 10/12/2014 Elsevier Interactive Patient Education  2018 Elsevier Inc.  

## 2017-08-20 LAB — HEPATITIS C ANTIBODY
HEP C AB: NONREACTIVE
SIGNAL TO CUT-OFF: 0.01 (ref <1.00)

## 2017-08-20 LAB — HEPATITIS B SURFACE ANTIGEN: Hepatitis B Surface Ag: NONREACTIVE

## 2017-08-20 LAB — RPR: RPR Ser Ql: NONREACTIVE

## 2017-08-20 LAB — HIV ANTIBODY (ROUTINE TESTING W REFLEX): HIV: NONREACTIVE

## 2017-08-20 LAB — C. TRACHOMATIS/N. GONORRHOEAE RNA
C. TRACHOMATIS RNA, TMA: NOT DETECTED
N. gonorrhoeae RNA, TMA: NOT DETECTED

## 2017-09-10 DIAGNOSIS — M1612 Unilateral primary osteoarthritis, left hip: Secondary | ICD-10-CM | POA: Diagnosis not present

## 2017-09-10 DIAGNOSIS — M1711 Unilateral primary osteoarthritis, right knee: Secondary | ICD-10-CM | POA: Diagnosis not present

## 2017-10-02 DIAGNOSIS — M1612 Unilateral primary osteoarthritis, left hip: Secondary | ICD-10-CM | POA: Diagnosis present

## 2017-10-02 DIAGNOSIS — M1711 Unilateral primary osteoarthritis, right knee: Secondary | ICD-10-CM | POA: Diagnosis present

## 2017-10-02 NOTE — H&P (Signed)
HIP ARTHROPLASTY ADMISSION H&P  Patient ID: Gabriela [name] MRN: [id number] DOB/AGE: 57-Nov-1962 57 y.o.  Chief Complaint: left hip pain.  Planned Procedure Date: 10/15/17 Medical Clearance by Dr. Ardeth Perfect    Cardiac Clearance by Dr. Gwenlyn Found   HPI: Gabriela [name] is a 57 y.o. female with a history of HLD, HTN who presents for evaluation of OA LEFT HIP. The patient has a history of pain and functional disability in the left hip due to arthritis and has failed non-surgical conservative treatments for greater than 12 weeks to include NSAID's and/or analgesics, corticosteriod injections and activity modification.  Onset of symptoms was gradual, starting 3 years ago with gradually worsening course since that time.  Patient currently rates pain at 8 out of 10 with activity. Patient has night pain, worsening of pain with activity and weight bearing and pain that interferes with activities of daily living.  Patient has evidence of subchondral cysts, subchondral sclerosis, periarticular osteophytes, joint space narrowing and and mild protrusio by imaging studies.  There is no active infection.  Past Medical History:  Diagnosis Date  . Anemia   . Benign colon polyp    adenomatous  . Chest pain    a. normal cors by cath in 2006 b. calcium score of 0 by Coronary CT in 11/2011  . Dysmenorrhea   . Fibroid   . Hip pain    left hip  . Hypertension   . Irregular heart beat   . Smoker    ONE PACK PER WEEK   Past Surgical History:  Procedure Laterality Date  . ABDOMINAL HYSTERECTOMY  06/2001   Toney Rakes, M.D.  . CARDIAC CATHETERIZATION  2003   Dr Gwenlyn Found  . CARDIOLITE MYOCARDIAL PERFUSION  06/07/03   NO PERFUSION ABNORMALITY. EF% 68%.  Marland Kitchen GYNECOLOGIC CRYOSURGERY  1993   DYSPLASIA  . LOWER EXT ARTERIAL  07/28/08   NORMAL  . TRANSTHORACIC ECHOCARDIOGRAM  07/03/04   TRACE MR,TRACE TR,TRACE PVR  . UMBILICAL HERNIA REPAIR     AS A CHILD   No Known Allergies   Prior to Admission medications    Medication Sig Start Date End Date Taking? Authorizing Provider  hydrochlorothiazide (HYDRODIURIL) 25 MG tablet Take 1 tablet (25 mg total) by mouth daily. 09/12/16   Ahmed Prima, Fransisco Hertz, PA-C  hydrOXYzine (ATARAX/VISTARIL) 10 MG tablet Take 1 tablet (10 mg total) by mouth 3 (three) times daily as needed for itching. 04/18/16   Haney, Amedeo Plenty, MD  irbesartan-hydrochlorothiazide (AVALIDE) 150-12.5 MG tablet Take 1 tablet by mouth daily. 08/12/17   [provider]  pravastatin (PRAVACHOL) 40 MG tablet Take 1 tablet (40 mg total) by mouth daily. 08/09/16   Sela Hilding, MD   Social History   Socioeconomic History  . Marital status: Single    Spouse name: Not on file  . Number of children: Not on file  . Years of education: Not on file  . Highest education level: Not on file  Occupational History  . Occupation: Unemployed     Employer: [employer]  Social Needs  . Financial resource strain: Not on file  . Food insecurity:    Worry: Not on file    Inability: Not on file  . Transportation needs:    Medical: Not on file    Non-medical: Not on file  Tobacco Use  . Smoking status: Former Smoker    Packs/day: 0.10    Last attempt to quit: 07/13/2013    Years since quitting: 4.2  . Smokeless tobacco: Never  Used  . Tobacco comment: only smokes 1 -2 cigarettes daily.  Substance and Sexual Activity  . Alcohol use: Yes    Alcohol/week: 2.0 standard drinks    Types: 2 Glasses of wine per week  . Drug use: No  . Sexual activity: Not Currently  Lifestyle  . Physical activity:    Days per week: Not on file    Minutes per session: Not on file  . Stress: Not on file  Relationships  . Social connections:    Talks on phone: Not on file    Gets together: Not on file    Attends religious service: Not on file    Active member of club or organization: Not on file    Attends meetings of clubs or organizations: Not on file    Relationship status: Not on file  Other Topics  Concern  . Not on file  Social History Narrative  . Not on file   Family History  Problem Relation Age of Onset  . Heart disease Mother   . Hypertension Mother   . Stroke Father   . Colon cancer Neg Hx   . Stomach cancer Neg Hx     ROS: Currently denies lightheadedness, dizziness, Fever, chills, CP, SOB.   No personal history of DVT, PE, MI, or CVA. No loose teeth or dentures  All other systems have been reviewed and were otherwise currently negative with the exception of those mentioned in the HPI and as above.  Objective: Vitals: Ht: 5'7" Wt: 207 Temp: 98.7 BP: 156/84 Pulse: 70 O2 97% on room air.   Physical Exam: General: Alert, NAD. Trendelenberg Gait  HEENT: EOMI, Good Neck Extension  Pulm: No increased work of breathing.  Clear B/L A/P w/o crackle or wheeze.  CV: RRR, No m/g/r appreciated  GI: soft, NT, ND Neuro: Neuro without gross focal deficit.  Sensation intact distally Skin: No lesions in the area of chief complaint MSK/Surgical Site: Left Hip pain with passive ROM.  Positive Stinchfield.  5/5 strength.  NVI.  Sensation intact distally.  Imaging Review Plain radiographs demonstrate severe degenerative joint disease of the left hip.  She also has severe OA right knee (mostly lateral).  Preoperative templating of the joint replacement has been completed, documented, and submitted to the Operating Room personnel in order to optimize intra-operative equipment management.  Assessment: LA LEFT HIP Principal Problem:   Primary osteoarthritis of left hip Active Problems:   HLD (hyperlipidemia)   Hypertension   Primary osteoarthritis of right knee   Plan: Plan for Procedure(s): LEFT TOTAL HIP ARTHROPLASTY ANTERIOR APPROACH  The patient history, physical exam, clinical judgement of the provider and imaging are consistent with end stage degenerative joint disease and total joint arthroplasty is deemed medically necessary. The treatment options including medical  management, injection therapy, and arthroplasty were discussed at length. The risks and benefits of Procedure(s): LEFT TOTAL HIP ARTHROPLASTY ANTERIOR APPROACH were presented and reviewed.  The risks of nonoperative treatment, versus surgical intervention including but not limited to continued pain, aseptic loosening, stiffness, dislocation/subluxation, infection, bleeding, nerve injury, blood clots, cardiopulmonary complications, morbidity, mortality, among others were discussed. The patient verbalizes understanding and wishes to proceed with the plan.  Patient is being admitted for inpatient treatment for surgery, pain control, PT, OT, prophylactic antibiotics, VTE prophylaxis, progressive ambulation, ADL's and discharge planning.   Dental prophylaxis discussed and recommended for 2 years postoperatively.   The patient does meet the criteria for TXA which will be used perioperatively.  ASA 81 mg BID will be used postoperatively for DVT prophylaxis in addition to SCDs, and early ambulation.  Norco, gabapentin, Celebrex for pain / inflammation.  The patient is planning to be discharged to her sister's home with home health services ([facility]) in care of her sister Lorie Phenix.   Prudencio Burly III, PA-C 10/02/2017 8:29 AM

## 2017-10-03 ENCOUNTER — Other Ambulatory Visit (HOSPITAL_COMMUNITY): Payer: Self-pay | Admitting: *Deleted

## 2017-10-03 NOTE — Patient Instructions (Addendum)
Gabriela [name]  10/03/2017   Your procedure is scheduled on: 10-15-17  Report to [facility] Main  Entrance  Report to admitting at  730 AM    Call this number if you have problems the morning of surgery [phone]   Remember: Do not eat food or drink liquids :After Midnight. BRUSH YOUR TEETH MORNING OF SURGERY AND RINSE YOUR MOUTH OUT, NO CHEWING GUM CANDY OR MINTS.     Take these medicines the morning of surgery with A SIP OF WATER: TYLENOL IF NEEDED                               You may not have any metal on your body including hair pins and              piercings  Do not wear jewelry, make-up, lotions, powders or perfumes, deodorant             Do not wear nail polish.  Do not shave  48 hours prior to surgery.                Do not bring valuables to the hospital. [place].  Contacts, dentures or bridgework may not be worn into surgery.  Leave suitcase in the car. After surgery it may be brought to your room.                  Please read over the following fact sheets you were given: _____________________________________________________________________             [facility] - Preparing for Surgery Before surgery, you can play an important role.  Because skin is not sterile, your skin needs to be as free of germs as possible.  You can reduce the number of germs on your skin by washing with CHG (chlorahexidine gluconate) soap before surgery.  CHG is an antiseptic cleaner which kills germs and bonds with the skin to continue killing germs even after washing. Please DO NOT use if you have an allergy to CHG or antibacterial soaps.  If your skin becomes reddened/irritated stop using the CHG and inform your nurse when you arrive at Short Stay. Do not shave (including legs and underarms) for at least 48 hours prior to the first CHG shower.  You may shave your face/neck. Please follow these instructions  carefully:  1.  Shower with CHG Soap the night before surgery and the  morning of Surgery.  2.  If you choose to wash your hair, wash your hair first as usual with your  normal  shampoo.  3.  After you shampoo, rinse your hair and body thoroughly to remove the  shampoo.                           4.  Use CHG as you would any other liquid soap.  You can apply chg directly  to the skin and wash                       Gently with a scrungie or clean washcloth.  5.  Apply the CHG Soap to your body ONLY FROM THE NECK DOWN.   Do not  use on face/ open                           Wound or open sores. Avoid contact with eyes, ears mouth and genitals (private parts).                       Wash face,  Genitals (private parts) with your normal soap.             6.  Wash thoroughly, paying special attention to the area where your surgery  will be performed.  7.  Thoroughly rinse your body with warm water from the neck down.  8.  DO NOT shower/wash with your normal soap after using and rinsing off  the CHG Soap.                9.  Pat yourself dry with a clean towel.            10.  Wear clean pajamas.            11.  Place clean sheets on your bed the night of your first shower and do not  sleep with pets. Day of Surgery : Do not apply any lotions/deodorants the morning of surgery.  Please wear clean clothes to the hospital/surgery center.  FAILURE TO FOLLOW THESE INSTRUCTIONS MAY RESULT IN THE CANCELLATION OF YOUR SURGERY PATIENT SIGNATURE_________________________________  NURSE SIGNATURE__________________________________  ________________________________________________________________________   Gabriela [name]  An incentive spirometer is a tool that can help keep your lungs clear and active. This tool measures how well you are filling your lungs with each breath. Taking long deep breaths may help reverse or decrease the chance of developing breathing (pulmonary) problems (especially infection)  following:  A long period of time when you are unable to move or be active. BEFORE THE PROCEDURE   If the spirometer includes an indicator to show your best effort, your nurse or respiratory therapist will set it to a desired goal.  If possible, sit up straight or lean slightly forward. Try not to slouch.  Hold the incentive spirometer in an upright position. INSTRUCTIONS FOR USE  1. Sit on the edge of your bed if possible, or sit up as far as you can in bed or on a chair. 2. Hold the incentive spirometer in an upright position. 3. Breathe out normally. 4. Place the mouthpiece in your mouth and seal your lips tightly around it. 5. Breathe in slowly and as deeply as possible, raising the piston or the ball toward the top of the column. 6. Hold your breath for 3-5 seconds or for as long as possible. Allow the piston or ball to fall to the bottom of the column. 7. Remove the mouthpiece from your mouth and breathe out normally. 8. Rest for a few seconds and repeat Steps 1 through 7 at least 10 times every 1-2 hours when you are awake. Take your time and take a few normal breaths between deep breaths. 9. The spirometer may include an indicator to show your best effort. Use the indicator as a goal to work toward during each repetition. 10. After each set of 10 deep breaths, practice coughing to be sure your lungs are clear. If you have an incision (the cut made at the time of surgery), support your incision when coughing by placing a pillow or rolled up towels firmly against it. Once you are able to get out of bed, walk  around indoors and cough well. You may stop using the incentive spirometer when instructed by your caregiver.  RISKS AND COMPLICATIONS  Take your time so you do not get dizzy or light-headed.  If you are in pain, you may need to take or ask for pain medication before doing incentive spirometry. It is harder to take a deep breath if you are having pain. AFTER USE  Rest and  breathe slowly and easily.  It can be helpful to keep track of a log of your progress. Your caregiver can provide you with a simple table to help with this. If you are using the spirometer at home, follow these instructions: [place] IF:   You are having difficultly using the spirometer.  You have trouble using the spirometer as often as instructed.  Your pain medication is not giving enough relief while using the spirometer.  You develop fever of 100.5 F (38.1 C) or higher. SEEK IMMEDIATE MEDICAL CARE IF:   You cough up bloody sputum that had not been present before.  You develop fever of 102 F (38.9 C) or greater.  You develop worsening pain at or near the incision site. MAKE SURE YOU:   Understand these instructions.  Will watch your condition.  Will get help right away if you are not doing well or get worse. Document Released: 05/21/2006 Document Revised: 04/02/2011 Document Reviewed: 07/22/2006 [facility] Patient Information 2014 [place], [place].   ________________________________________________________________________

## 2017-10-03 NOTE — Progress Notes (Signed)
CARDIAC CLEARANCE NOTE DR Gwenlyn Found 08-02-17 Epic EXERCISE TOLERANCE TEST 09-19-16 EPIC

## 2017-10-10 ENCOUNTER — Other Ambulatory Visit: Payer: Self-pay

## 2017-10-10 ENCOUNTER — Encounter (HOSPITAL_COMMUNITY)
Admission: RE | Admit: 2017-10-10 | Discharge: 2017-10-10 | Disposition: A | Discharge status: Home / Self Care | Payer: 59 | Source: Ambulatory Visit | Attending: Orthopedic Surgery | Admitting: Orthopedic Surgery

## 2017-10-10 ENCOUNTER — Encounter (HOSPITAL_COMMUNITY): Payer: Self-pay

## 2017-10-10 DIAGNOSIS — I10 Essential (primary) hypertension: Secondary | ICD-10-CM | POA: Diagnosis not present

## 2017-10-10 DIAGNOSIS — Z01810 Encounter for preprocedural cardiovascular examination: Secondary | ICD-10-CM | POA: Diagnosis not present

## 2017-10-10 DIAGNOSIS — I517 Cardiomegaly: Secondary | ICD-10-CM | POA: Insufficient documentation

## 2017-10-10 DIAGNOSIS — Z01812 Encounter for preprocedural laboratory examination: Secondary | ICD-10-CM | POA: Diagnosis not present

## 2017-10-10 HISTORY — DX: Unspecified osteoarthritis, unspecified site: M19.90

## 2017-10-10 LAB — CBC
HCT: 38.5 % (ref 36.0–46.0)
Hemoglobin: 12.3 g/dL (ref 12.0–15.0)
MCH: 27.2 pg (ref 26.0–34.0)
MCHC: 31.9 g/dL (ref 30.0–36.0)
MCV: 85 fL (ref 78.0–100.0)
PLATELETS: 367 10*3/uL (ref 150–400)
RBC: 4.53 MIL/uL (ref 3.87–5.11)
RDW: 13.1 % (ref 11.5–15.5)
WBC: 5.9 10*3/uL (ref 4.0–10.5)

## 2017-10-10 LAB — BASIC METABOLIC PANEL
ANION GAP: 7 (ref 5–15)
BUN: 10 mg/dL (ref 6–20)
CALCIUM: 9.7 mg/dL (ref 8.9–10.3)
CO2: 30 mmol/L (ref 22–32)
CREATININE: 0.75 mg/dL (ref 0.44–1.00)
Chloride: 105 mmol/L (ref 98–111)
GFR calc Af Amer: >60 mL/min (ref >60)
Glucose, Bld: 101 mg/dL — ABNORMAL HIGH (ref 70–99)
Potassium: 3.9 mmol/L (ref 3.5–5.1)
Sodium: 142 mmol/L (ref 135–145)

## 2017-10-10 LAB — SURGICAL PCR SCREEN
MRSA, PCR: NEGATIVE
STAPHYLOCOCCUS AUREUS: NEGATIVE

## 2017-10-10 NOTE — Progress Notes (Signed)
CHART LEFT WITH IHECHI EWARUM FOR FINAL EKG RESULTS

## 2017-10-14 MED ORDER — BUPIVACAINE LIPOSOME 1.3 % IJ SUSP
20.0000 mL | Freq: Once | INTRAMUSCULAR | Status: DC
  Filled 2017-10-14: qty 20

## 2017-10-15 ENCOUNTER — Inpatient Hospital Stay (HOSPITAL_COMMUNITY): Payer: 59 | Admitting: Anesthesiology

## 2017-10-15 ENCOUNTER — Inpatient Hospital Stay (HOSPITAL_COMMUNITY)
Admission: RE | Admit: 2017-10-15 | Discharge: 2017-10-16 | DRG: 470 | Disposition: A | Discharge status: Home Health | Payer: 59 | Source: Other Acute Inpatient Hospital | Attending: Orthopedic Surgery | Admitting: Orthopedic Surgery

## 2017-10-15 ENCOUNTER — Inpatient Hospital Stay (HOSPITAL_COMMUNITY): Payer: 59

## 2017-10-15 ENCOUNTER — Encounter (HOSPITAL_COMMUNITY)
Admission: RE | Disposition: A | Discharge status: Home Health | Payer: Self-pay | Source: Other Acute Inpatient Hospital | Attending: Orthopedic Surgery

## 2017-10-15 ENCOUNTER — Encounter (HOSPITAL_COMMUNITY): Payer: Self-pay

## 2017-10-15 ENCOUNTER — Other Ambulatory Visit: Payer: Self-pay

## 2017-10-15 DIAGNOSIS — M1612 Unilateral primary osteoarthritis, left hip: Secondary | ICD-10-CM | POA: Diagnosis not present

## 2017-10-15 DIAGNOSIS — E785 Hyperlipidemia, unspecified: Secondary | ICD-10-CM | POA: Diagnosis present

## 2017-10-15 DIAGNOSIS — Z87891 Personal history of nicotine dependence: Secondary | ICD-10-CM | POA: Diagnosis not present

## 2017-10-15 DIAGNOSIS — Z560 Unemployment, unspecified: Secondary | ICD-10-CM

## 2017-10-15 DIAGNOSIS — Z90710 Acquired absence of both cervix and uterus: Secondary | ICD-10-CM

## 2017-10-15 DIAGNOSIS — M1711 Unilateral primary osteoarthritis, right knee: Secondary | ICD-10-CM | POA: Diagnosis present

## 2017-10-15 DIAGNOSIS — Z96642 Presence of left artificial hip joint: Secondary | ICD-10-CM | POA: Diagnosis not present

## 2017-10-15 DIAGNOSIS — F320 Major depressive disorder, single episode, mild: Secondary | ICD-10-CM

## 2017-10-15 DIAGNOSIS — I10 Essential (primary) hypertension: Secondary | ICD-10-CM

## 2017-10-15 DIAGNOSIS — Z8249 Family history of ischemic heart disease and other diseases of the circulatory system: Secondary | ICD-10-CM

## 2017-10-15 DIAGNOSIS — Z471 Aftercare following joint replacement surgery: Secondary | ICD-10-CM | POA: Diagnosis not present

## 2017-10-15 DIAGNOSIS — E7800 Pure hypercholesterolemia, unspecified: Secondary | ICD-10-CM

## 2017-10-15 DIAGNOSIS — Z79899 Other long term (current) drug therapy: Secondary | ICD-10-CM

## 2017-10-15 DIAGNOSIS — M1610 Unilateral primary osteoarthritis, unspecified hip: Secondary | ICD-10-CM | POA: Diagnosis present

## 2017-10-15 DIAGNOSIS — Z86010 Personal history of colonic polyps: Secondary | ICD-10-CM | POA: Diagnosis not present

## 2017-10-15 DIAGNOSIS — Z419 Encounter for procedure for purposes other than remedying health state, unspecified: Secondary | ICD-10-CM

## 2017-10-15 HISTORY — PX: TOTAL HIP ARTHROPLASTY: SHX124

## 2017-10-15 SURGERY — ARTHROPLASTY, HIP, TOTAL, ANTERIOR APPROACH
Anesthesia: General | Site: Hip | Laterality: Left

## 2017-10-15 MED ORDER — DOCUSATE SODIUM 100 MG PO CAPS: 100.0000 mg | ORAL_CAPSULE | Freq: Two times a day (BID) | ORAL | 0 refills | Status: DC

## 2017-10-15 MED ORDER — ASPIRIN EC 81 MG PO TBEC: 81.0000 mg | DELAYED_RELEASE_TABLET | Freq: Two times a day (BID) | ORAL | 0 refills | Status: DC

## 2017-10-15 MED ORDER — BUPIVACAINE LIPOSOME 1.3 % IJ SUSP
INTRAMUSCULAR | Status: DC | PRN
  Administered 2017-10-15: 20 mL

## 2017-10-15 MED ORDER — ONDANSETRON HCL 4 MG/2ML IJ SOLN
INTRAMUSCULAR | Status: DC | PRN
  Administered 2017-10-15: 4 mg via INTRAVENOUS

## 2017-10-15 MED ORDER — ROCURONIUM BROMIDE 10 MG/ML (PF) SYRINGE
PREFILLED_SYRINGE | INTRAVENOUS | Status: AC
  Filled 2017-10-15: qty 10

## 2017-10-15 MED ORDER — DEXAMETHASONE SODIUM PHOSPHATE 10 MG/ML IJ SOLN
INTRAMUSCULAR | Status: AC
  Filled 2017-10-15: qty 1

## 2017-10-15 MED ORDER — LIDOCAINE 2% (20 MG/ML) 5 ML SYRINGE
INTRAMUSCULAR | Status: DC | PRN
  Administered 2017-10-15: 100 mg via INTRAVENOUS

## 2017-10-15 MED ORDER — OXYCODONE HCL 5 MG PO TABS
ORAL_TABLET | ORAL | Status: AC
  Filled 2017-10-15: qty 1

## 2017-10-15 MED ORDER — ACETAMINOPHEN 500 MG PO TABS: 1000.0000 mg | ORAL_TABLET | Freq: Three times a day (TID) | ORAL | 0 refills | Status: AC

## 2017-10-15 MED ORDER — LACTATED RINGERS IV SOLN
INTRAVENOUS | Status: DC
  Administered 2017-10-15: 08:00:00 via INTRAVENOUS

## 2017-10-15 MED ORDER — METOCLOPRAMIDE HCL 5 MG PO TABS: 5.0000 mg | ORAL_TABLET | Freq: Three times a day (TID) | ORAL | Status: DC | PRN

## 2017-10-15 MED ORDER — TRANEXAMIC ACID 1000 MG/10ML IV SOLN
1000.0000 mg | INTRAVENOUS | Status: AC
  Administered 2017-10-15: 1000 mg via INTRAVENOUS
  Filled 2017-10-15: qty 10

## 2017-10-15 MED ORDER — METHOCARBAMOL 500 MG IVPB - SIMPLE MED
INTRAVENOUS | Status: AC
  Filled 2017-10-15: qty 50

## 2017-10-15 MED ORDER — PRAVASTATIN SODIUM 20 MG PO TABS
40.0000 mg | ORAL_TABLET | Freq: Every day | ORAL | Status: DC
  Administered 2017-10-15: 40 mg via ORAL
  Filled 2017-10-15: qty 2

## 2017-10-15 MED ORDER — OXYCODONE HCL 5 MG PO TABS: 5.0000 mg | ORAL_TABLET | ORAL | 0 refills | Status: AC | PRN

## 2017-10-15 MED ORDER — METHOCARBAMOL 500 MG PO TABS: 500.0000 mg | ORAL_TABLET | Freq: Four times a day (QID) | ORAL | 0 refills | Status: DC | PRN

## 2017-10-15 MED ORDER — MAGNESIUM CITRATE PO SOLN: 1.0000 | Freq: Once | ORAL | Status: DC | PRN

## 2017-10-15 MED ORDER — ONDANSETRON HCL 4 MG PO TABS: 4.0000 mg | ORAL_TABLET | Freq: Three times a day (TID) | ORAL | 0 refills | Status: DC | PRN

## 2017-10-15 MED ORDER — CEFAZOLIN SODIUM-DEXTROSE 1-4 GM/50ML-% IV SOLN
1.0000 g | Freq: Four times a day (QID) | INTRAVENOUS | Status: AC
  Administered 2017-10-15 (×2): 1 g via INTRAVENOUS
  Filled 2017-10-15 (×3): qty 50

## 2017-10-15 MED ORDER — POLYETHYLENE GLYCOL 3350 17 G PO PACK: 17.0000 g | PACK | Freq: Every day | ORAL | Status: DC | PRN

## 2017-10-15 MED ORDER — METHOCARBAMOL 500 MG PO TABS
500.0000 mg | ORAL_TABLET | Freq: Four times a day (QID) | ORAL | Status: DC | PRN
  Administered 2017-10-15 – 2017-10-16 (×2): 500 mg via ORAL
  Filled 2017-10-15 (×2): qty 1

## 2017-10-15 MED ORDER — MIDAZOLAM HCL 5 MG/5ML IJ SOLN
INTRAMUSCULAR | Status: DC | PRN
  Administered 2017-10-15: 2 mg via INTRAVENOUS

## 2017-10-15 MED ORDER — GABAPENTIN 300 MG PO CAPS
300.0000 mg | ORAL_CAPSULE | Freq: Three times a day (TID) | ORAL | Status: DC
  Administered 2017-10-15 – 2017-10-16 (×3): 300 mg via ORAL
  Filled 2017-10-15 (×3): qty 1

## 2017-10-15 MED ORDER — IRBESARTAN 150 MG PO TABS
150.0000 mg | ORAL_TABLET | Freq: Every day | ORAL | Status: DC
  Administered 2017-10-15 – 2017-10-16 (×2): 150 mg via ORAL
  Filled 2017-10-15 (×2): qty 1

## 2017-10-15 MED ORDER — PROPOFOL 10 MG/ML IV BOLUS
INTRAVENOUS | Status: AC
  Filled 2017-10-15: qty 20

## 2017-10-15 MED ORDER — PHENYLEPHRINE 40 MCG/ML (10ML) SYRINGE FOR IV PUSH (FOR BLOOD PRESSURE SUPPORT)
PREFILLED_SYRINGE | INTRAVENOUS | Status: AC
  Filled 2017-10-15: qty 10

## 2017-10-15 MED ORDER — HYDROMORPHONE HCL 2 MG/ML IJ SOLN
INTRAMUSCULAR | Status: AC
  Filled 2017-10-15: qty 1

## 2017-10-15 MED ORDER — ACETAMINOPHEN 500 MG PO TABS
ORAL_TABLET | ORAL | Status: AC
  Filled 2017-10-15: qty 2

## 2017-10-15 MED ORDER — STERILE WATER FOR IRRIGATION IR SOLN
Status: DC | PRN
  Administered 2017-10-15: 2000 mL

## 2017-10-15 MED ORDER — OXYCODONE HCL 5 MG PO TABS: 5.0000 mg | ORAL_TABLET | ORAL | 0 refills | Status: DC | PRN

## 2017-10-15 MED ORDER — ACETAMINOPHEN 500 MG PO TABS
1000.0000 mg | ORAL_TABLET | Freq: Once | ORAL | Status: AC
  Administered 2017-10-15: 1000 mg via ORAL
  Filled 2017-10-15: qty 2

## 2017-10-15 MED ORDER — SODIUM CHLORIDE FLUSH 0.9 % IV SOLN
INTRAVENOUS | Status: DC | PRN
  Administered 2017-10-15: 30 mL

## 2017-10-15 MED ORDER — DOCUSATE SODIUM 100 MG PO CAPS
100.0000 mg | ORAL_CAPSULE | Freq: Two times a day (BID) | ORAL | Status: DC
  Administered 2017-10-15 – 2017-10-16 (×2): 100 mg via ORAL
  Filled 2017-10-15 (×2): qty 1

## 2017-10-15 MED ORDER — PHENOL 1.4 % MT LIQD
1.0000 | OROMUCOSAL | Status: DC | PRN
  Filled 2017-10-15: qty 177

## 2017-10-15 MED ORDER — METOCLOPRAMIDE HCL 5 MG/ML IJ SOLN: 5.0000 mg | Freq: Three times a day (TID) | INTRAMUSCULAR | Status: DC | PRN

## 2017-10-15 MED ORDER — ASPIRIN 81 MG PO CHEW
81.0000 mg | CHEWABLE_TABLET | Freq: Two times a day (BID) | ORAL | Status: DC
  Administered 2017-10-15 – 2017-10-16 (×2): 81 mg via ORAL
  Filled 2017-10-15 (×2): qty 1

## 2017-10-15 MED ORDER — CELECOXIB 200 MG PO CAPS
200.0000 mg | ORAL_CAPSULE | Freq: Two times a day (BID) | ORAL | Status: DC
  Administered 2017-10-15 – 2017-10-16 (×2): 200 mg via ORAL
  Filled 2017-10-15 (×2): qty 1

## 2017-10-15 MED ORDER — HYDROMORPHONE HCL 1 MG/ML IJ SOLN
0.2500 mg | INTRAMUSCULAR | Status: DC | PRN
  Administered 2017-10-15: 0.5 mg via INTRAVENOUS

## 2017-10-15 MED ORDER — FENTANYL CITRATE (PF) 250 MCG/5ML IJ SOLN
INTRAMUSCULAR | Status: AC
  Filled 2017-10-15: qty 5

## 2017-10-15 MED ORDER — DEXAMETHASONE SODIUM PHOSPHATE 10 MG/ML IJ SOLN
INTRAMUSCULAR | Status: DC | PRN
  Administered 2017-10-15: 10 mg via INTRAVENOUS

## 2017-10-15 MED ORDER — CHLORHEXIDINE GLUCONATE 4 % EX LIQD: 60.0000 mL | Freq: Once | CUTANEOUS | Status: DC

## 2017-10-15 MED ORDER — ACETAMINOPHEN 325 MG PO TABS: 325.0000 mg | ORAL_TABLET | Freq: Four times a day (QID) | ORAL | Status: DC | PRN

## 2017-10-15 MED ORDER — ROCURONIUM BROMIDE 10 MG/ML (PF) SYRINGE
PREFILLED_SYRINGE | INTRAVENOUS | Status: DC | PRN
  Administered 2017-10-15: 10 mg via INTRAVENOUS
  Administered 2017-10-15: 20 mg via INTRAVENOUS
  Administered 2017-10-15: 50 mg via INTRAVENOUS

## 2017-10-15 MED ORDER — SODIUM CHLORIDE 0.9 % IJ SOLN
INTRAMUSCULAR | Status: AC
  Filled 2017-10-15: qty 50

## 2017-10-15 MED ORDER — ONDANSETRON HCL 4 MG/2ML IJ SOLN
INTRAMUSCULAR | Status: AC
  Filled 2017-10-15: qty 2

## 2017-10-15 MED ORDER — HYDROMORPHONE HCL 1 MG/ML IJ SOLN
INTRAMUSCULAR | Status: AC
  Administered 2017-10-15: 0.5 mg via INTRAVENOUS
  Filled 2017-10-15: qty 1

## 2017-10-15 MED ORDER — PROPOFOL 10 MG/ML IV BOLUS
INTRAVENOUS | Status: DC | PRN
  Administered 2017-10-15: 180 mg via INTRAVENOUS

## 2017-10-15 MED ORDER — CELECOXIB 200 MG PO CAPS: 200.0000 mg | ORAL_CAPSULE | Freq: Two times a day (BID) | ORAL | 0 refills | Status: DC

## 2017-10-15 MED ORDER — GABAPENTIN 300 MG PO CAPS
300.0000 mg | ORAL_CAPSULE | Freq: Once | ORAL | Status: AC
  Administered 2017-10-15: 300 mg via ORAL
  Filled 2017-10-15: qty 1

## 2017-10-15 MED ORDER — METHOCARBAMOL 500 MG IVPB - SIMPLE MED
500.0000 mg | Freq: Four times a day (QID) | INTRAVENOUS | Status: DC | PRN
  Administered 2017-10-15: 500 mg via INTRAVENOUS
  Filled 2017-10-15: qty 50

## 2017-10-15 MED ORDER — HYDROMORPHONE HCL 1 MG/ML IJ SOLN
INTRAMUSCULAR | Status: DC | PRN
  Administered 2017-10-15 (×3): 0.5 mg via INTRAVENOUS

## 2017-10-15 MED ORDER — LIDOCAINE 2% (20 MG/ML) 5 ML SYRINGE
INTRAMUSCULAR | Status: AC
  Filled 2017-10-15: qty 5

## 2017-10-15 MED ORDER — ONDANSETRON HCL 4 MG/2ML IJ SOLN: 4.0000 mg | Freq: Four times a day (QID) | INTRAMUSCULAR | Status: DC | PRN

## 2017-10-15 MED ORDER — 0.9 % SODIUM CHLORIDE (POUR BTL) OPTIME
TOPICAL | Status: DC | PRN
  Administered 2017-10-15: 1000 mL

## 2017-10-15 MED ORDER — DEXAMETHASONE SODIUM PHOSPHATE 10 MG/ML IJ SOLN
10.0000 mg | Freq: Once | INTRAMUSCULAR | Status: AC
  Administered 2017-10-16: 10 mg via INTRAVENOUS
  Filled 2017-10-15: qty 1

## 2017-10-15 MED ORDER — ONDANSETRON HCL 4 MG PO TABS: 4.0000 mg | ORAL_TABLET | Freq: Four times a day (QID) | ORAL | Status: DC | PRN

## 2017-10-15 MED ORDER — ACETAMINOPHEN 500 MG PO TABS
1000.0000 mg | ORAL_TABLET | Freq: Four times a day (QID) | ORAL | Status: DC
  Administered 2017-10-15 – 2017-10-16 (×3): 1000 mg via ORAL
  Filled 2017-10-15 (×2): qty 2

## 2017-10-15 MED ORDER — HYDROMORPHONE HCL 1 MG/ML IJ SOLN: 0.5000 mg | INTRAMUSCULAR | Status: DC | PRN

## 2017-10-15 MED ORDER — PROPOFOL 10 MG/ML IV BOLUS
INTRAVENOUS | Status: AC
  Filled 2017-10-15: qty 40

## 2017-10-15 MED ORDER — SUGAMMADEX SODIUM 200 MG/2ML IV SOLN
INTRAVENOUS | Status: DC | PRN
  Administered 2017-10-15: 200 mg via INTRAVENOUS

## 2017-10-15 MED ORDER — MENTHOL 3 MG MT LOZG: 1.0000 | LOZENGE | OROMUCOSAL | Status: DC | PRN

## 2017-10-15 MED ORDER — IRBESARTAN-HYDROCHLOROTHIAZIDE 150-12.5 MG PO TABS: 1.0000 | ORAL_TABLET | Freq: Every day | ORAL | Status: DC

## 2017-10-15 MED ORDER — LACTATED RINGERS IV SOLN
INTRAVENOUS | Status: DC
  Administered 2017-10-15 (×2): via INTRAVENOUS

## 2017-10-15 MED ORDER — CELECOXIB 200 MG PO CAPS
ORAL_CAPSULE | ORAL | Status: AC
  Filled 2017-10-15: qty 1

## 2017-10-15 MED ORDER — CEFAZOLIN SODIUM-DEXTROSE 2-4 GM/100ML-% IV SOLN
2.0000 g | INTRAVENOUS | Status: AC
  Administered 2017-10-15: 2 g via INTRAVENOUS
  Filled 2017-10-15: qty 100

## 2017-10-15 MED ORDER — CELECOXIB 200 MG PO CAPS: 200.0000 mg | ORAL_CAPSULE | Freq: Two times a day (BID) | ORAL | 0 refills | Status: AC

## 2017-10-15 MED ORDER — ACETAMINOPHEN 500 MG PO TABS: 1000.0000 mg | ORAL_TABLET | Freq: Three times a day (TID) | ORAL | 0 refills | Status: DC

## 2017-10-15 MED ORDER — OXYCODONE HCL 5 MG PO TABS
5.0000 mg | ORAL_TABLET | ORAL | Status: DC | PRN
  Administered 2017-10-15 – 2017-10-16 (×2): 5 mg via ORAL
  Filled 2017-10-15: qty 1

## 2017-10-15 MED ORDER — FENTANYL CITRATE (PF) 250 MCG/5ML IJ SOLN
INTRAMUSCULAR | Status: DC | PRN
  Administered 2017-10-15 (×2): 50 ug via INTRAVENOUS
  Administered 2017-10-15: 100 ug via INTRAVENOUS
  Administered 2017-10-15: 50 ug via INTRAVENOUS

## 2017-10-15 MED ORDER — MIDAZOLAM HCL 2 MG/2ML IJ SOLN
INTRAMUSCULAR | Status: AC
  Filled 2017-10-15: qty 2

## 2017-10-15 MED ORDER — HYDROCHLOROTHIAZIDE 12.5 MG PO CAPS
12.5000 mg | ORAL_CAPSULE | Freq: Every day | ORAL | Status: DC
  Administered 2017-10-16: 12.5 mg via ORAL
  Filled 2017-10-15: qty 1

## 2017-10-15 MED ORDER — GABAPENTIN 300 MG PO CAPS: 300.0000 mg | ORAL_CAPSULE | Freq: Two times a day (BID) | ORAL | 0 refills | Status: DC

## 2017-10-15 MED ORDER — SORBITOL 70 % SOLN
30.0000 mL | Freq: Every day | Status: DC | PRN
  Filled 2017-10-15: qty 30

## 2017-10-15 MED ORDER — SUGAMMADEX SODIUM 200 MG/2ML IV SOLN
INTRAVENOUS | Status: AC
  Filled 2017-10-15: qty 2

## 2017-10-15 MED ORDER — DIPHENHYDRAMINE HCL 12.5 MG/5ML PO ELIX: 12.5000 mg | ORAL_SOLUTION | ORAL | Status: DC | PRN

## 2017-10-15 SURGICAL SUPPLY — 46 items
BAG DECANTER FOR FLEXI CONT (MISCELLANEOUS) IMPLANT
BLADE SAG 18X100X1.27 (BLADE) ×1 IMPLANT
COVER PERINEAL POST (MISCELLANEOUS) ×2 IMPLANT
COVER SURGICAL LIGHT HANDLE (MISCELLANEOUS) ×2 IMPLANT
DECANTER SPIKE VIAL GLASS SM (MISCELLANEOUS) ×2 IMPLANT
DRAPE IMP U-DRAPE 54X76 (DRAPES) ×2 IMPLANT
DRAPE STERI IOBAN 125X83 (DRAPES) ×2 IMPLANT
DRAPE U-SHAPE 47X51 STRL (DRAPES) ×4 IMPLANT
DRSG MEPILEX BORDER 4X8 (GAUZE/BANDAGES/DRESSINGS) ×2 IMPLANT
DURAPREP 26ML APPLICATOR (WOUND CARE) ×2 IMPLANT
ELECT REM PT RETURN 9FT ADLT (ELECTROSURGICAL) ×2
ELECTRODE REM PT RTRN 9FT ADLT (ELECTROSURGICAL) IMPLANT
GLOVE BIO SURGEON STRL SZ7.5 (GLOVE) ×4 IMPLANT
GLOVE BIOGEL PI IND STRL 7.0 (GLOVE) ×4 IMPLANT
GLOVE BIOGEL PI IND STRL 7.5 (GLOVE) ×3 IMPLANT
GLOVE BIOGEL PI IND STRL 8 (GLOVE) ×2 IMPLANT
GLOVE BIOGEL PI INDICATOR 7.0 (GLOVE) ×4
GLOVE BIOGEL PI INDICATOR 7.5 (GLOVE) ×3
GLOVE BIOGEL PI INDICATOR 8 (GLOVE) ×2
GOWN SPEC L4 XLG W/TWL (GOWN DISPOSABLE) ×2 IMPLANT
GOWN STRL REUS W/ TWL LRG LVL3 (GOWN DISPOSABLE) ×1 IMPLANT
GOWN STRL REUS W/ TWL XL LVL3 (GOWN DISPOSABLE) ×1 IMPLANT
GOWN STRL REUS W/TWL LRG LVL3 (GOWN DISPOSABLE) ×4 IMPLANT
GOWN STRL REUS W/TWL XL LVL3 (GOWN DISPOSABLE) ×2
HEAD BIOLOX HIP 36/-2.5 (Joint) ×1 IMPLANT
HIP BIOLOX HD 36/-2.5 (Joint) ×2 IMPLANT
HOLDER FOLEY CATH W/STRAP (MISCELLANEOUS) ×1 IMPLANT
INSERT POLYETHYLENE 36M-0 (Insert) ×2 IMPLANT
MANIFOLD NEPTUNE II (INSTRUMENTS) ×2 IMPLANT
NS IRRIG 1000ML POUR BTL (IV SOLUTION) ×2 IMPLANT
PACK ANTERIOR HIP CUSTOM (KITS) ×2 IMPLANT
POSITIONER SURGICAL ARM (MISCELLANEOUS) ×1 IMPLANT
SCREW HEX LP 6.5X20 (Screw) ×2 IMPLANT
SHELL TRIDENT II CLUST 50 (Shell) ×2 IMPLANT
STEM FEM ACCOLADE 38X102X30 S3 (Stem) ×1 IMPLANT
STRIP CLOSURE SKIN 1/2X4 (GAUZE/BANDAGES/DRESSINGS) ×2 IMPLANT
SUT MNCRL AB 4-0 PS2 18 (SUTURE) ×2 IMPLANT
SUT VIC AB 0 CT1 36 (SUTURE) ×1 IMPLANT
SUT VIC AB 1 CT1 36 (SUTURE) ×2 IMPLANT
SUT VIC AB 2-0 CT1 27 (SUTURE) ×4
SUT VIC AB 2-0 CT1 TAPERPNT 27 (SUTURE) ×2 IMPLANT
SUT VLOC 180 0 24IN GS25 (SUTURE) IMPLANT
TRAY FOLEY CATH 14FRSI W/METER (CATHETERS) ×2 IMPLANT
TRAY FOLEY MTR SLVR 16FR STAT (SET/KITS/TRAYS/PACK) IMPLANT
WATER STERILE IRR 1000ML POUR (IV SOLUTION) ×4 IMPLANT
YANKAUER SUCT BULB TIP 10FT TU (MISCELLANEOUS) ×2 IMPLANT

## 2017-10-15 NOTE — Evaluation (Addendum)
Physical Therapy Evaluation Patient Details Name: Gabriela [name] MRN: [id number] DOB: Jul 07, 1960 Today's Date: 10/15/2017   History of Present Illness  Pt is a 57 YO female s/p L DA-THA on 9/24. PMH includes anemia, hip pain, HTN, smoker, cardiac cath.   Clinical Impression  Pt s/p L DA-THA. Pt presents with difficulty performing mobility tasks, mild L hip pain, and decreased activity tolerance for ambulation. Pt to benefit from acute PT to address deficits. Pt ambulated hallway distance at slow gait speed with RW this session. PT to progress mobility as able, and will continue to follow acutely.     Follow Up Recommendations Follow surgeon's recommendation for DC plan and follow-up therapies;Supervision for mobility/OOB(HHPT)    Equipment Recommendations  Rolling walker with 5" wheels;3in1 (PT);Cane(DME supposed to be delivering to room; cane for stairs )    Recommendations for Other Services       Precautions / Restrictions Precautions Precautions: Fall Restrictions Weight Bearing Restrictions: No Other Position/Activity Restrictions: WBAT       Mobility  Bed Mobility Overal bed mobility: Needs Assistance Bed Mobility: Supine to Sit     Supine to sit: Min assist;HOB elevated     General bed mobility comments: Min assist for LE management, scooting to EOB, trunk elevation. Pt with self-steadying at EOB.   Transfers Overall transfer level: Needs assistance Equipment used: Rolling walker (2 wheeled) Transfers: Sit to/from Stand Sit to Stand: Min assist;From elevated surface         General transfer comment: Min assist for power up from bed. Verbal cuing for hand placement.   Ambulation/Gait Ambulation/Gait assistance: Min guard Gait Distance (Feet): 50 Feet Assistive device: Rolling walker (2 wheeled) Gait Pattern/deviations: Step-to pattern;Decreased stance time - left;Decreased weight shift to left;Antalgic Gait velocity: very decr    General Gait Details: Min  guard for safety. Repeated cuing for placement in RW, sequencing.   Stairs            Wheelchair Mobility    Modified Rankin (Stroke Patients Only)       Balance Overall balance assessment: Mild deficits observed, not formally tested                                           Pertinent Vitals/Pain Pain Assessment: 0-10 Pain Score: 4  Pain Location: L hip  Pain Descriptors / Indicators: Sore;Operative site guarding Pain Intervention(s): Limited activity within patient's tolerance;Ice applied;Monitored during session;Repositioned;Premedicated before session    Home Living Family/patient expects to be discharged to:: Private residence Living Arrangements: Other relatives(will live with sister after d/c ) Available Help at Discharge: Family;Available PRN/intermittently Type of Home: House Home Access: Stairs to enter Entrance Stairs-Rails: Can reach both;Left;Right Entrance Stairs-Number of Steps: 4 Home Layout: One level Home Equipment: (DME delivering equipment (RW, BSC) to pt room, per pt )      Prior Function Level of Independence: Independent               Hand Dominance   Dominant Hand: Left    Extremity/Trunk Assessment   Upper Extremity Assessment Upper Extremity Assessment: Overall WFL for tasks assessed    Lower Extremity Assessment Lower Extremity Assessment: Overall WFL for tasks assessed;LLE deficits/detail LLE Deficits / Details: suspected post-surgical hip weakness; able to perform quad set x1  LLE Sensation: WNL    Cervical / Trunk Assessment Cervical / Trunk Assessment: Normal  Communication  Communication: No difficulties  Cognition Arousal/Alertness: Lethargic;Suspect due to medications Behavior During Therapy: [facility] for tasks assessed/performed Overall Cognitive Status: Within Functional Limits for tasks assessed                                        General Comments      Exercises Total  Joint Exercises Ankle Circles/Pumps: AROM;Both;10 reps;Seated   Assessment/Plan    PT Assessment Patient needs continued PT services  PT Problem List Decreased strength;Pain;Decreased range of motion;Decreased activity tolerance;Decreased knowledge of use of DME;Decreased balance;Decreased safety awareness;Decreased mobility       PT Treatment Interventions DME instruction;Therapeutic activities;Gait training;Therapeutic exercise;Patient/family education;Stair training;Balance training;Functional mobility training    PT Goals (Current goals can be found in the Care Plan section)  Acute Rehab PT Goals PT Goal Formulation: With patient Time For Goal Achievement: 10/29/17 Potential to Achieve Goals: Good    Frequency 7X/week   Barriers to discharge        Co-evaluation               AM-PAC PT "6 Clicks" Daily Activity  Outcome Measure Difficulty turning over in bed (including adjusting bedclothes, sheets and blankets)?: Unable Difficulty moving from lying on back to sitting on the side of the bed? : Unable Difficulty sitting down on and standing up from a chair with arms (e.g., wheelchair, bedside commode, etc,.)?: Unable Help needed moving to and from a bed to chair (including a wheelchair)?: A Little Help needed walking in hospital room?: A Little Help needed climbing 3-5 steps with a railing? : A Little 6 Click Score: 12    End of Session Equipment Utilized During Treatment: Gait belt Activity Tolerance: Patient tolerated treatment well Patient left: in chair;with chair alarm set;with call bell/phone within reach;with family/visitor present;with SCD's reapplied   PT Visit Diagnosis: Other abnormalities of gait and mobility (R26.89);Difficulty in walking, not elsewhere classified (R26.2)    Time: 1830-1901 PT Time Calculation (min) (ACUTE ONLY): 31 min   Charges:   PT Evaluation $PT Eval Low Complexity: 1 Low PT Treatments $Gait Training: 8-22 mins         Julien Girt, PT Acute Rehabilitation Services Pager [phone]  Office [phone]  Gabriela [name] D Gabriela [name] 10/15/2017, 7:42 PM

## 2017-10-15 NOTE — Anesthesia Procedure Notes (Signed)
Procedure Name: Intubation Date/Time: 10/15/2017 10:13 AM Performed by: [place] Carman Auxier C, CRNA Pre-anesthesia Checklist: Patient identified, Emergency Drugs available, Suction available and Patient being monitored Patient Re-evaluated:Patient Re-evaluated prior to induction Oxygen Delivery Method: Circle system utilized Preoxygenation: Pre-oxygenation with 100% oxygen Induction Type: IV induction Ventilation: Mask ventilation without difficulty Laryngoscope Size: Mac and 3 Grade View: Grade III Tube type: Oral Tube size: 7.0 mm Number of attempts: 1 Airway Equipment and Method: Stylet and Oral airway Placement Confirmation: ETT inserted through vocal cords under direct vision,  positive ETCO2 and breath sounds checked- equal and bilateral Secured at: 21 cm Tube secured with: Tape Dental Injury: Teeth and Oropharynx as per pre-operative assessment  Comments: Large concave epiglottis but able to easily pass ett

## 2017-10-15 NOTE — Discharge Instructions (Signed)

## 2017-10-15 NOTE — Op Note (Signed)
10/15/2017  12:51 PM  PATIENT:  Gabriela [name]   MRN: [id number]  PRE-OPERATIVE DIAGNOSIS:  OSTEOARTHRITIS LEFT HIP  POST-OPERATIVE DIAGNOSIS:  OSTEOARTHRITIS LEFT HIP  PROCEDURE:  Procedure(s): LEFT TOTAL HIP ARTHROPLASTY ANTERIOR APPROACH  PREOPERATIVE INDICATIONS:    Gabriela [name] is an 57 y.o. female who has a diagnosis of Primary osteoarthritis of left hip and elected for surgical management after failing conservative treatment.  The risks benefits and alternatives were discussed with the patient including but not limited to the risks of nonoperative treatment, versus surgical intervention including infection, bleeding, nerve injury, periprosthetic fracture, the need for revision surgery, dislocation, leg length discrepancy, blood clots, cardiopulmonary complications, morbidity, mortality, among others, and they were willing to proceed.     OPERATIVE REPORT     SURGEON:   [name], MD    ASSISTANT:  Roxan Hockey, PA-C, he was present and scrubbed throughout the case, critical for completion in a timely fashion, and for retraction, instrumentation, and closure.     ANESTHESIA:  General    COMPLICATIONS:  None.     COMPONENTS:  Stryker acolade fit femur size 3 with a 36 mm -2.5 head ball and a PSL acetabular shell size 50 with a  polyethylene liner    PROCEDURE IN DETAIL:   The patient was met in the holding area and  identified.  The appropriate hip was identified and marked at the operative site.  The patient was then transported to the OR  and  placed under anesthesia per that record.  At that point, the patient was  placed in the supine position and  secured to the operating room table and all bony prominences padded. He received pre-operative antibiotics    The operative lower extremity was prepped from the iliac crest to the distal leg.  Sterile draping was performed.  Time out was performed prior to incision.      Skin incision was made just 2 cm lateral to  the ASIS  extending in line with the tensor fascia lata. Electrocautery was used to control all bleeders. I dissected down sharply to the fascia of the tensor fascia lata was confirmed that the muscle fibers beneath were running posteriorly. I then incised the fascia over the superficial tensor fascia lata in line with the incision. The fascia was elevated off the anterior aspect of the muscle the muscle was retracted posteriorly and protected throughout the case. I then used electrocautery to incise the tensor fascia lata fascia control and all bleeders. Immediately visible was the fat over top of the anterior neck and capsule.  I removed the anterior fat from the capsule and elevated the rectus muscle off of the anterior capsule. I then removed a large time of capsule. The retractors were then placed over the anterior acetabulum as well as around the superior and inferior neck.  I then removed a section of the femoral neck and a napkin ring fashion. Then used the power course to remove the femoral head from the acetabulum and thoroughly irrigated the acetabulum. I sized the femoral head.    I then exposed the deep acetabulum, cleared out any tissue including the ligamentum teres.   After adequate visualization, I excised the labrum, and then sequentially reamed.  I then impacted the acetabular implant into place using fluoroscopy for guidance.  Appropriate version and inclination was confirmed clinically matching their bony anatomy, and with fluoroscopy.  I placed a 20 mm screw in the posterior/superio position with an excellent bite.  I then placed the polyethylene liner in place  I then adducted the leg and released the external rotators from the posterior femur allowing it to be easily delivered up lateral and anterior to the acetabulum for preparation of the femoral canal.    I then prepared the proximal femur using the cookie-cutter and then sequentially reamed and broached.  A trial broach,  neck, and head was utilized, and I reduced the hip and used floroscopy to assess the neck length and femoral implant.  I then impacted the femoral prosthesis into place into the appropriate version. The hip was then reduced and fluoroscopy confirmed appropriate position. Leg lengths were restored.  I then irrigated the hip copiously again with, and repaired the fascia with Vicryl, followed by monocryl for the subcutaneous tissue, Monocryl for the skin, Steri-Strips and sterile gauze. The patient was then awakened and returned to PACU in stable and satisfactory condition. There were no complications.  POST OPERATIVE PLAN: WBAT, DVT px: SCD's/TED, ambulation and chemical dvt px  Gabriela Jobson, MD Orthopedic Surgeon [phone]     

## 2017-10-15 NOTE — Interval H&P Note (Signed)
History and Physical Interval Note:  10/15/2017 9:08 AM  Gabriela [name]  has presented today for surgery, with the diagnosis of LA LEFT HIP  The various methods of treatment have been discussed with the patient and family. After consideration of risks, benefits and other options for treatment, the patient has consented to  Procedure(s): LEFT TOTAL HIP ARTHROPLASTY ANTERIOR APPROACH (Left) as a surgical intervention .  The patient's history has been reviewed, patient examined, no change in status, stable for surgery.  I have reviewed the patient's chart and labs.  Questions were answered to the patient's satisfaction.     [name]

## 2017-10-15 NOTE — Transfer of Care (Signed)
Immediate Anesthesia Transfer of Care Note  Patient: Gabriela [name]  Procedure(s) Performed: LEFT TOTAL HIP ARTHROPLASTY ANTERIOR APPROACH (Left Hip)  Patient Location: PACU  Anesthesia Type:General  Level of Consciousness: awake and patient cooperative  Airway & Oxygen Therapy: Patient Spontanous Breathing and Patient connected to face mask oxygen  Post-op Assessment: Report given to RN and Post -op Vital signs reviewed and stable  Post vital signs: Reviewed and stable  Last Vitals:  Vitals Value Taken Time  BP    Temp    Pulse    Resp    SpO2      Last Pain:  Vitals:   10/15/17 0809  TempSrc:   PainSc: 0-No pain         Complications: No apparent anesthesia complications

## 2017-10-15 NOTE — Anesthesia Preprocedure Evaluation (Addendum)
Anesthesia Evaluation  Patient identified by MRN, date of birth, ID band Patient awake    Reviewed: Allergy & Precautions, NPO status , Patient's Chart, lab work & pertinent test results  Airway Mallampati: II  TM Distance: >3 FB     Dental   Pulmonary former smoker,    breath sounds clear to auscultation       Cardiovascular hypertension,  Rhythm:Regular Rate:Normal     Neuro/Psych    GI/Hepatic negative GI ROS, Neg liver ROS,   Endo/Other  negative endocrine ROS  Renal/GU negative Renal ROS     Musculoskeletal  (+) Arthritis ,   Abdominal   Peds  Hematology  (+) anemia ,   Anesthesia Other Findings   Reproductive/Obstetrics                             Anesthesia Physical Anesthesia Plan  ASA: III  Anesthesia Plan: General   Post-op Pain Management:    Induction: Intravenous  PONV Risk Score and Plan: Dexamethasone, Midazolam and Ondansetron  Airway Management Planned: Oral ETT  Additional Equipment:   Intra-op Plan:   Post-operative Plan: Extubation in OR  Informed Consent: I have reviewed the patients History and Physical, chart, labs and discussed the procedure including the risks, benefits and alternatives for the proposed anesthesia with the patient or authorized representative who has indicated his/her understanding and acceptance.   Dental advisory given  Plan Discussed with: Anesthesiologist and CRNA  Anesthesia Plan Comments:        Anesthesia Quick Evaluation

## 2017-10-15 NOTE — Anesthesia Postprocedure Evaluation (Signed)
Anesthesia Post Note  Patient: Gabriela [name]  Procedure(s) Performed: LEFT TOTAL HIP ARTHROPLASTY ANTERIOR APPROACH (Left Hip)     Patient location during evaluation: PACU Anesthesia Type: General Level of consciousness: awake Pain management: pain level controlled Vital Signs Assessment: post-procedure vital signs reviewed and stable Respiratory status: spontaneous breathing Cardiovascular status: stable Postop Assessment: no apparent nausea or vomiting Anesthetic complications: no    Last Vitals:  Vitals:   10/15/17 1359 10/15/17 1402  BP: (!) 142/104 (!) 152/70  Pulse: 66   Resp: 12   Temp:    SpO2: 100%     Last Pain:  Vitals:   10/15/17 1359  TempSrc:   PainSc: Asleep                 Jaykob Minichiello

## 2017-10-16 ENCOUNTER — Encounter (HOSPITAL_COMMUNITY): Payer: Self-pay | Admitting: Orthopedic Surgery

## 2017-10-16 NOTE — Progress Notes (Signed)
Physical Therapy Treatment Patient Details Name: Gabriela [name] MRN: [id number] DOB: 1960-06-17 Today's Date: 10/16/2017    History of Present Illness Pt is a 57 YO female s/p L DA-THA on 9/24. PMH includes anemia, hip pain, HTN, smoker, cardiac cath.     PT Comments    Pt progressing well with mobility.  Pt reviewed therex, stairs and car transfers.   Follow Up Recommendations  Follow surgeon's recommendation for DC plan and follow-up therapies;Supervision for mobility/OOB     Equipment Recommendations  Rolling walker with 5" wheels;3in1 (PT);Cane    Recommendations for Other Services OT consult     Precautions / Restrictions Precautions Precautions: Fall Restrictions Weight Bearing Restrictions: No Other Position/Activity Restrictions: WBAT     Mobility  Bed Mobility Overal bed mobility: Needs Assistance Bed Mobility: Supine to Sit     Supine to sit: Min guard     General bed mobility comments: cues for sequence and use of R LE to self assist  Transfers Overall transfer level: Needs assistance Equipment used: Rolling walker (2 wheeled) Transfers: Sit to/from Stand Sit to Stand: Min guard;Supervision         General transfer comment: cues for LE management and use of UEs to self assist  Ambulation/Gait Ambulation/Gait assistance: Min guard;Supervision Gait Distance (Feet): 150 Feet Assistive device: Rolling walker (2 wheeled) Gait Pattern/deviations: Step-to pattern;Decreased stance time - left;Decreased weight shift to left;Antalgic Gait velocity: decr   General Gait Details: Min guard for safety. Repeated cuing for placement in RW, sequencing.    Stairs Stairs: Yes Stairs assistance: Min guard Stair Management: Two rails;Forwards;Step to pattern Number of Stairs: 5 General stair comments: cues for sequence and foot placement   Wheelchair Mobility    Modified Rankin (Stroke Patients Only)       Balance Overall balance assessment: Mild  deficits observed, not formally tested                                          Cognition Arousal/Alertness: Awake/alert Behavior During Therapy: WFL for tasks assessed/performed Overall Cognitive Status: Within Functional Limits for tasks assessed                                        Exercises Total Joint Exercises Ankle Circles/Pumps: AROM;Both;Seated;20 reps Quad Sets: AROM;Both;10 reps;Supine Heel Slides: AAROM;Left;20 reps;Supine    General Comments        Pertinent Vitals/Pain Pain Assessment: 0-10 Pain Score: 4  Pain Location: L hip  Pain Descriptors / Indicators: Sore;Operative site guarding Pain Intervention(s): Limited activity within patient's tolerance;Monitored during session;Premedicated before session;Ice applied    Home Living                      Prior Function            PT Goals (current goals can now be found in the care plan section) Acute Rehab PT Goals Patient Stated Goal: Regain IND PT Goal Formulation: With patient Time For Goal Achievement: 10/29/17 Potential to Achieve Goals: Good Progress towards PT goals: Progressing toward goals    Frequency    7X/week      PT Plan Current plan remains appropriate    Co-evaluation              AM-PAC PT "  6 Clicks" Daily Activity  Outcome Measure  Difficulty turning over in bed (including adjusting bedclothes, sheets and blankets)?: A Lot Difficulty moving from lying on back to sitting on the side of the bed? : A Lot Difficulty sitting down on and standing up from a chair with arms (e.g., wheelchair, bedside commode, etc,.)?: Unable Help needed moving to and from a bed to chair (including a wheelchair)?: A Little Help needed walking in hospital room?: A Little Help needed climbing 3-5 steps with a railing? : A Little 6 Click Score: 14    End of Session Equipment Utilized During Treatment: Gait belt Activity Tolerance: Patient tolerated  treatment well Patient left: in chair;with chair alarm set;with call bell/phone within reach;with family/visitor present;with SCD's reapplied Nurse Communication: Mobility status PT Visit Diagnosis: Difficulty in walking, not elsewhere classified (R26.2)     Time: 2482-5003 PT Time Calculation (min) (ACUTE ONLY): 44 min  Charges:  $Gait Training: 8-22 mins $Therapeutic Exercise: 8-22 mins $Therapeutic Activity: 8-22 mins                     [place] Pager [phone] Office [phone]    Gabriela [name] 10/16/2017, 12:22 PM

## 2017-10-16 NOTE — Care Management Note (Signed)
Case Management Note  Patient Details  Name: Gabriela [name] MRN: [id number] Date of Birth: Sep 23, 1960  Subjective/Objective:     Discharge planning, spoke with patient and spouse at bedside. Have chosen [facility] PT, evaluate and treat.               Action/Plan: Contacted [facility] for referral. Needs RW and 3n1, [employer] delivered to room. [phone]   Expected Discharge Date:  10/16/17               Expected Discharge Plan:  [place]  In-House Referral:  NA  Discharge planning Services  CM Consult  Post Acute Care Choice:  Home Health, Durable Medical Equipment Choice offered to:  Patient  DME Arranged:  3-N-1, Walker rolling DME Agency:  [employer]  HH Arranged:  PT [place]:  [facility] [employer]  Status of Service:  Completed, signed off  If discussed at [employer], dates discussed:    Additional Comments:  Guadalupe Maple, RN 10/16/2017, 10:06 AM

## 2017-10-16 NOTE — Plan of Care (Signed)

## 2017-10-16 NOTE — Evaluation (Signed)
Occupational Therapy Evaluation Patient Details Name: Gabriela [name] MRN: [id number] DOB: 1960-09-29 Today's Date: 10/16/2017    History of Present Illness Pt is a 57 YO female s/p L DA-THA on 9/24. PMH includes anemia, hip pain, HTN, smoker, cardiac cath.    Clinical Impression   OT education complete.  No further OT needed. Family ( sister ) will A as needed.    Follow Up Recommendations  No OT follow up;Supervision/Assistance - 24 hour    Equipment Recommendations  3 in 1 bedside commode    Recommendations for Other Services       Precautions / Restrictions Precautions Precautions: Fall Restrictions Weight Bearing Restrictions: No Other Position/Activity Restrictions: WBAT       Mobility Bed Mobility Overal bed mobility: Needs Assistance Bed Mobility: Supine to Sit     Supine to sit: Min guard     General bed mobility comments: pt in chair  Transfers Overall transfer level: Needs assistance Equipment used: Rolling walker (2 wheeled) Transfers: Sit to/from [employer] Sit to Stand: Min guard;Supervision Stand pivot transfers: Supervision;Min guard       General transfer comment: cues for LE management and use of UEs to self assist    Balance Overall balance assessment: Mild deficits observed, not formally tested                                         ADL either performed or assessed with clinical judgement   ADL Overall ADL's : Needs assistance/impaired Eating/Feeding: Set up;Sitting   Grooming: Set up;Sitting   Upper Body Bathing: Set up;Sitting   Lower Body Bathing: Minimal assistance;Sit to/from stand;Cueing for sequencing;Cueing for safety   Upper Body Dressing : Set up;Sitting   Lower Body Dressing: Minimal assistance;Sit to/from stand;Cueing for sequencing;Cueing for safety   Toilet Transfer: Min guard;RW;Comfort height toilet   Toileting- Clothing Manipulation and Hygiene: Min guard;Sit to/from stand         General ADL Comments: Family will A as needed. Education regarding safett with ADL activity complete.     Vision Patient Visual Report: No change from baseline              Pertinent Vitals/Pain Pain Assessment: 0-10 Pain Score: 3  Pain Location: L hip  Pain Descriptors / Indicators: Discomfort Pain Intervention(s): Limited activity within patient's tolerance;Monitored during session     Hand Dominance Left   Extremity/Trunk Assessment     Lower Extremity Assessment LLE Sensation: WNL   Cervical / Trunk Assessment Cervical / Trunk Assessment: Normal   Communication Communication Communication: No difficulties   Cognition Arousal/Alertness: Awake/alert Behavior During Therapy: WFL for tasks assessed/performed Overall Cognitive Status: Within Functional Limits for tasks assessed                                        Exercises Exercises: Total Joint Total Joint Exercises Ankle Circles/Pumps: AROM;Both;Seated;20 reps Quad Sets: AROM;Both;10 reps;Supine Heel Slides: AAROM;Left;20 reps;Supine   Shoulder Instructions      Home Living Family/patient expects to be discharged to:: Private residence Living Arrangements: Other relatives(will live with sister after d/c ) Available Help at Discharge: Family;Available PRN/intermittently Type of Home: House Home Access: Stairs to enter [employer] of Steps: 4 Entrance Stairs-Rails: Can reach both;Left;Right Home Layout: One level  Bathroom Shower/Tub: Engineer, manufacturing: Yes   Home Equipment: (DME delivering equipment (RW, BSC) to pt room, per pt )          Prior Functioning/Environment Level of Independence: Independent                 OT Problem List:        OT Treatment/Interventions:      OT Goals(Current goals can be found in the care plan section) Acute Rehab OT Goals Patient Stated Goal: Regain IND OT Goal  Formulation: With patient  OT Frequency:     Barriers to D/C:               AM-PAC PT "6 Clicks" Daily Activity     Outcome Measure Help from another person eating meals?: None Help from another person taking care of personal grooming?: None Help from another person toileting, which includes using toliet, bedpan, or urinal?: A Little Help from another person bathing (including washing, rinsing, drying)?: A Little Help from another person to put on and taking off regular upper body clothing?: None Help from another person to put on and taking off regular lower body clothing?: A Little 6 Click Score: 21   End of Session    Activity Tolerance: Patient tolerated treatment well Patient left: in chair                   Time: 1108-1140 OT Time Calculation (min): 32 min Charges:  OT General Charges $OT Visit: 1 Visit OT Evaluation $OT Eval Low Complexity: 1 Low OT Treatments $Self Care/Home Management : 8-22 mins  Kari Baars, OT Acute Rehabilitation Services [phone] Office- [place]     Chesky Heyer, Edwena Felty D 10/16/2017, 1:08 PM

## 2017-10-16 NOTE — Plan of Care (Signed)
Problem: Education: Goal: Knowledge of General Education information will improve Description Including pain rating scale, medication(s)/side effects and non-pharmacologic comfort measures 10/16/2017 0252 by Blase Mess, RN Outcome: Progressing 10/16/2017 0147 by Blase Mess, RN Outcome: Progressing   Problem: Health Behavior/Discharge Planning: Goal: Ability to manage health-related needs will improve 10/16/2017 0252 by Blase Mess, RN Outcome: Progressing 10/16/2017 0147 by Blase Mess, RN Outcome: Progressing   Problem: Clinical Measurements: Goal: Ability to maintain clinical measurements within normal limits will improve 10/16/2017 0252 by Blase Mess, RN Outcome: Progressing 10/16/2017 0147 by Blase Mess, RN Outcome: Progressing Goal: Will remain free from infection 10/16/2017 0252 by Blase Mess, RN Outcome: Progressing 10/16/2017 0147 by Blase Mess, RN Outcome: Progressing Goal: Diagnostic test results will improve 10/16/2017 0252 by Blase Mess, RN Outcome: Progressing 10/16/2017 0147 by Blase Mess, RN Outcome: Progressing Goal: Respiratory complications will improve 10/16/2017 0252 by Blase Mess, RN Outcome: Progressing 10/16/2017 0147 by Blase Mess, RN Outcome: Progressing Goal: Cardiovascular complication will be avoided 10/16/2017 0252 by Blase Mess, RN Outcome: Progressing 10/16/2017 0147 by Blase Mess, RN Outcome: Progressing   Problem: Activity: Goal: Risk for activity intolerance will decrease 10/16/2017 0252 by Blase Mess, RN Outcome: Progressing 10/16/2017 0147 by Blase Mess, RN Outcome: Progressing   Problem: Nutrition: Goal: Adequate nutrition will be maintained 10/16/2017 0252 by Blase Mess, RN Outcome: Progressing 10/16/2017 0147 by Blase Mess, RN Outcome: Progressing   Problem: Coping: Goal: Level  of anxiety will decrease 10/16/2017 0252 by Blase Mess, RN Outcome: Progressing 10/16/2017 0147 by Blase Mess, RN Outcome: Progressing   Problem: Elimination: Goal: Will not experience complications related to bowel motility 10/16/2017 0252 by Blase Mess, RN Outcome: Progressing 10/16/2017 0147 by Blase Mess, RN Outcome: Progressing Goal: Will not experience complications related to urinary retention 10/16/2017 0252 by Blase Mess, RN Outcome: Progressing 10/16/2017 0147 by Blase Mess, RN Outcome: Progressing   Problem: Pain Managment: Goal: General experience of comfort will improve 10/16/2017 0252 by Blase Mess, RN Outcome: Progressing 10/16/2017 0147 by Blase Mess, RN Outcome: Progressing   Problem: Safety: Goal: Ability to remain free from injury will improve 10/16/2017 0252 by Blase Mess, RN Outcome: Progressing 10/16/2017 0147 by Blase Mess, RN Outcome: Progressing   Problem: Skin Integrity: Goal: Risk for impaired skin integrity will decrease 10/16/2017 0252 by Blase Mess, RN Outcome: Progressing 10/16/2017 0147 by Blase Mess, RN Outcome: Progressing   Problem: Education: Goal: Knowledge of the prescribed therapeutic regimen will improve 10/16/2017 0252 by Blase Mess, RN Outcome: Progressing 10/16/2017 0147 by Blase Mess, RN Outcome: Progressing Goal: Understanding of discharge needs will improve 10/16/2017 0252 by Blase Mess, RN Outcome: Progressing 10/16/2017 0147 by Blase Mess, RN Outcome: Progressing Goal: Individualized Educational Video(s) 10/16/2017 0252 by Blase Mess, RN Outcome: Progressing 10/16/2017 0147 by Blase Mess, RN Outcome: Progressing   Problem: Activity: Goal: Ability to avoid complications of mobility impairment will improve 10/16/2017 0252 by Blase Mess, RN Outcome:  Progressing 10/16/2017 0147 by Blase Mess, RN Outcome: Progressing Goal: Ability to tolerate increased activity will improve 10/16/2017 0252 by Blase Mess, RN Outcome: Progressing 10/16/2017 0147 by Blase Mess, RN Outcome: Progressing   Problem: Clinical Measurements: Goal: Postoperative complications will be avoided or minimized 10/16/2017 0252 by Blase Mess, RN Outcome: Progressing 10/16/2017 0147 by Blase Mess, RN Outcome: Progressing   Problem: Pain Management: Goal: Pain level will decrease with appropriate interventions 10/16/2017 0252 by Blase Mess, RN Outcome: Progressing 10/16/2017 0147 by Blase Mess, RN Outcome: Progressing   Problem: Skin Integrity: Goal: Will show signs  of wound healing 10/16/2017 0252 by Blase Mess, RN Outcome: Progressing 10/16/2017 0147 by Blase Mess, RN Outcome: Progressing

## 2017-10-16 NOTE — Discharge Summary (Signed)
Discharge Summary  Patient ID: ZASHA BELLEAU MRN: [id number] DOB/AGE: 57-Jan-1962 57 y.o.  Admit date: 10/15/2017 Discharge date: 10/16/2017  Admission Diagnoses:  Primary osteoarthritis of left hip  Discharge Diagnoses:  Principal Problem:   Primary osteoarthritis of left hip Active Problems:   HLD (hyperlipidemia)   Hypertension   Primary osteoarthritis of right knee   Primary osteoarthritis of hip   Past Medical History:  Diagnosis Date  . Anemia   . Arthritis    OA  . Benign colon polyp    adenomatous  . Chest pain    a. normal cors by cath in 2006 b. calcium score of 0 by Coronary CT in 11/2011  . Dysmenorrhea   . Fibroid   . Hip pain    left hip  . Hypertension   . Irregular heart beat   . Smoker    ONE PACK PER WEEK    Surgeries: Procedure(s): LEFT TOTAL HIP ARTHROPLASTY ANTERIOR APPROACH on 10/15/2017   Consultants (if any):   Discharged Condition: Improved  Hospital Course: Gabriela [name] is an 57 y.o. female who was admitted 10/15/2017 with a diagnosis of Primary osteoarthritis of left hip and went to the operating room on 10/15/2017 and underwent the above named procedures.    She was given perioperative antibiotics:  Anti-infectives (From admission, onward)   Start     Dose/Rate Route Frequency Ordered Stop   10/15/17 1600  ceFAZolin (ANCEF) IVPB 1 g/50 mL premix     1 g 100 mL/hr over 30 Minutes Intravenous Every 6 hours 10/15/17 1426 10/15/17 2152   10/15/17 0745  ceFAZolin (ANCEF) IVPB 2g/100 mL premix     2 g 200 mL/hr over 30 Minutes Intravenous On call to O.R. 10/15/17 4401 10/15/17 1015    .  She was given sequential compression devices, early ambulation, and Aspirin for DVT prophylaxis.  She benefited maximally from the hospital stay and there were no complications.    Recent vital signs:  Vitals:   10/16/17 0140 10/16/17 0641  BP: 134/70 137/77  Pulse: 62 66  Resp:  16  Temp:  98.2 F (36.8 C)  SpO2: 100% 100%    Recent  laboratory studies:  Lab Results  Component Value Date   HGB 12.3 10/10/2017   HGB 11.5 (L) 08/29/2016   HGB 13.0 08/03/2016   Lab Results  Component Value Date   WBC 5.9 10/10/2017   PLT 367 10/10/2017   No results found for: INR Lab Results  Component Value Date   NA 142 10/10/2017   K 3.9 10/10/2017   CL 105 10/10/2017   CO2 30 10/10/2017   BUN 10 10/10/2017   CREATININE 0.75 10/10/2017   GLUCOSE 101 (H) 10/10/2017    Discharge Medications:   Allergies as of 10/16/2017   No Known Allergies     Medication List    STOP taking these medications   ibuprofen 200 MG tablet Commonly known as:  ADVIL,MOTRIN   traMADol 50 MG tablet Commonly known as:  ULTRAM     TAKE these medications   acetaminophen 500 MG tablet Commonly known as:  TYLENOL Take 2 tablets (1,000 mg total) by mouth every 8 (eight) hours for 14 days. For Pain. What changed:    when to take this  reasons to take this  additional instructions   ALLERGY EYE DROPS OP Place 1 drop into both eyes daily as needed (allergies).   aspirin EC 81 MG tablet Take 1 tablet (81 mg total) by  mouth 2 (two) times daily. For DVT prophylaxis   CALCIUM 600 + D PO Take 1 tablet by mouth daily.   celecoxib 200 MG capsule Commonly known as:  CELEBREX Take 1 capsule (200 mg total) by mouth 2 (two) times daily for 14 days. For 2 weeks post op for pain and inflammation.  Discontinue Ibuprofen or other Anti-inflammatory medicine when taking this medicine.   docusate sodium 100 MG capsule Commonly known as:  COLACE Take 1 capsule (100 mg total) by mouth 2 (two) times daily. To prevent constipation while taking pain medication.   gabapentin 300 MG capsule Commonly known as:  NEURONTIN Take 1 capsule (300 mg total) by mouth 2 (two) times daily for 14 days. For 2 weeks post op for pain.   hydrOXYzine 10 MG tablet Commonly known as:  ATARAX/VISTARIL Take 1 tablet (10 mg total) by mouth 3 (three) times daily as needed  for itching.   irbesartan-hydrochlorothiazide 150-12.5 MG tablet Commonly known as:  AVALIDE Take 1 tablet by mouth daily.   methocarbamol 500 MG tablet Commonly known as:  ROBAXIN Take 1 tablet (500 mg total) by mouth every 6 (six) hours as needed for muscle spasms.   multivitamin with minerals Tabs tablet Take 1 tablet by mouth daily.   ondansetron 4 MG tablet Commonly known as:  ZOFRAN Take 1 tablet (4 mg total) by mouth every 8 (eight) hours as needed for nausea or vomiting.   oxyCODONE 5 MG immediate release tablet Commonly known as:  Oxy IR/ROXICODONE Take 1 tablet (5 mg total) by mouth every 4 (four) hours as needed for breakthrough pain.   pravastatin 40 MG tablet Commonly known as:  PRAVACHOL Take 1 tablet (40 mg total) by mouth daily. What changed:  when to take this   TURMERIC PO Take 1 tablet by mouth 2 (two) times daily.       Diagnostic Studies: Dg C-arm 1-60 Min-no Report  Result Date: 10/15/2017 Fluoroscopy was utilized by the requesting physician.  No radiographic interpretation.   Dg Hip Operative Unilat W Or W/o Pelvis Left  Result Date: 10/15/2017 CLINICAL DATA:  Status post left total hip joint prosthesis placement. EXAM: OPERATIVE left HIP (WITH PELVIS IF PERFORMED) 2 VIEWS TECHNIQUE: Fluoroscopic spot image(s) were submitted for interpretation post-operatively. COMPARISON:  None. FINDINGS: Reported fluoro time is 17.8 seconds. The patient has undergone placement of a total left knee joint prosthesis. Radiographic positioning of the prosthetic components is good. The interface with the native bone appears normal. IMPRESSION: No immediate postprocedure complication following left total hip joint prosthesis placement. Electronically Signed   By: David  Martinique M.D.   On: 10/15/2017 12:04    Disposition: Discharge disposition: 01-Home or Self Care       Discharge Instructions    Discharge patient   Complete by:  As directed    Discharge  disposition:  01-Home or Self Care   Discharge patient date:  10/16/2017      Follow-up Information    [name], MD Follow up.   Specialty:  Orthopedic Surgery Contact information: [address] [address] [phone]            Signed: Prudencio Burly III PA-C 10/16/2017, 8:02 AM

## 2017-10-16 NOTE — Plan of Care (Signed)
Pt alert and oriented, doing well this am. Pain well controlled. Plan to d/c home per MD order. Plan of care discussed with pt. RN will monitor.

## 2017-10-16 NOTE — Progress Notes (Signed)
    Subjective: Patient reports pain as mild to moderate.  Tolerating diet.  Urinating.   No CP, SOB.  Good early mobilization.  Objective:   VITALS:   Vitals:   10/15/17 2245 10/16/17 0138 10/16/17 0140 10/16/17 0641  BP: 131/70 134/70 134/70 137/77  Pulse: 70 66 62 66  Resp:  17  16  Temp: 98.2 F (36.8 C) 97.8 F (36.6 C)  98.2 F (36.8 C)  TempSrc: Oral Oral  Oral  SpO2:  100% 100% 100%  Weight:      Height:       CBC Latest Ref Rng & Units 10/10/2017 08/29/2016 08/03/2016  WBC 4.0 - 10.5 K/uL 5.9 7.1 6.1  Hemoglobin 12.0 - 15.0 g/dL 12.3 11.5(L) 13.0  Hematocrit 36.0 - 46.0 % 38.5 34.6(L) 39.0  Platelets 150 - 400 K/uL 367 313 346   BMP Latest Ref Rng & Units 10/10/2017 08/29/2016 08/03/2016  Glucose 70 - 99 mg/dL 101(H) 107(H) 94  BUN 6 - 20 mg/dL 10 17 9   Creatinine 0.44 - 1.00 mg/dL 0.75 0.65 0.81  BUN/Creat Ratio 9 - 23 - - 11  Sodium 135 - 145 mmol/L 142 139 143  Potassium 3.5 - 5.1 mmol/L 3.9 3.6 4.2  Chloride 98 - 111 mmol/L 105 106 103  CO2 22 - 32 mmol/L 30 26 25   Calcium 8.9 - 10.3 mg/dL 9.7 9.4 10.0   Intake/Output      09/24 0701 - 09/25 0700 09/25 0701 - 09/26 0700   P.O. 740    I.V. (mL/kg) 3896.2 (42.1)    Other 200    IV Piggyback 200    Total Intake(mL/kg) 5036.2 (54.4)    Urine (mL/kg/hr) 1400    Blood 475    Total Output 1875    Net +3161.2         Urine Occurrence 0 x       Physical Exam: General: NAD.  Supine in bed on arrival.  Calm, conversant. Resp: No increased wob Cardio: regular rate and rhythm ABD soft Neurologically intact MSK LLE: Neurovascularly intact Sensation intact distally Intact pulses distally Dorsiflexion/Plantar flexion intact Incision: dressing C/D/I   Assessment: 1 Day Post-Op  S/P Procedure(s) (LRB): LEFT TOTAL HIP ARTHROPLASTY ANTERIOR APPROACH (Left) by Dr. Ernesta Amble. Percell Miller on 10/15/2017  Principal Problem:   Primary osteoarthritis of left hip Active Problems:   HLD (hyperlipidemia)  Hypertension   Primary osteoarthritis of right knee   Primary osteoarthritis of hip   Primary osteoarthritis, status post total hip arthroplasty Doing well postop day 1 Eating, drinking, and voiding Pain controlled Good early mobilization  Plan: Advance diet Up with therapy D/C IV fluids Incentive Spirometry Apply ice   Weight Bearing: Weight Bearing as Tolerated (WBAT)  Dressings: Maintain Mepilex.   VTE prophylaxis: Aspirin, SCDs, ambulation Dispo: Home (sister's home) today   Prudencio Burly III, PA-C 10/16/2017, 8:00 AM

## 2017-10-16 NOTE — Progress Notes (Signed)
Pt's bp up to 184/88, HR 101 when aide checked vitals.  PA paged and stated pt is able to be d/c'd.  RN will recheck bp and monitor.

## 2017-10-16 NOTE — Progress Notes (Signed)
Discharge paperwork discussed with pt at the bedside. She demonstrated understanding and questions were answered.  Pt was escorted in stable condition by wheelchair to main lobby,.

## 2017-10-17 DIAGNOSIS — I10 Essential (primary) hypertension: Secondary | ICD-10-CM | POA: Diagnosis not present

## 2017-10-17 DIAGNOSIS — Z96642 Presence of left artificial hip joint: Secondary | ICD-10-CM | POA: Diagnosis not present

## 2017-10-17 DIAGNOSIS — Z471 Aftercare following joint replacement surgery: Secondary | ICD-10-CM | POA: Diagnosis not present

## 2017-10-17 DIAGNOSIS — M1711 Unilateral primary osteoarthritis, right knee: Secondary | ICD-10-CM | POA: Diagnosis not present

## 2017-10-21 DIAGNOSIS — Z96642 Presence of left artificial hip joint: Secondary | ICD-10-CM | POA: Diagnosis not present

## 2017-10-23 DIAGNOSIS — Z96642 Presence of left artificial hip joint: Secondary | ICD-10-CM | POA: Diagnosis not present

## 2017-10-25 DIAGNOSIS — Z96642 Presence of left artificial hip joint: Secondary | ICD-10-CM | POA: Diagnosis not present

## 2017-10-29 DIAGNOSIS — Z96642 Presence of left artificial hip joint: Secondary | ICD-10-CM | POA: Diagnosis not present

## 2017-10-30 DIAGNOSIS — M1612 Unilateral primary osteoarthritis, left hip: Secondary | ICD-10-CM | POA: Diagnosis not present

## 2017-11-01 DIAGNOSIS — I10 Essential (primary) hypertension: Secondary | ICD-10-CM | POA: Diagnosis not present

## 2017-11-01 DIAGNOSIS — Z96642 Presence of left artificial hip joint: Secondary | ICD-10-CM | POA: Diagnosis not present

## 2017-11-01 DIAGNOSIS — M1711 Unilateral primary osteoarthritis, right knee: Secondary | ICD-10-CM | POA: Diagnosis not present

## 2017-11-01 DIAGNOSIS — M1612 Unilateral primary osteoarthritis, left hip: Secondary | ICD-10-CM | POA: Diagnosis not present

## 2017-12-10 ENCOUNTER — Other Ambulatory Visit: Payer: Self-pay | Admitting: Women's Health

## 2017-12-10 DIAGNOSIS — Z1231 Encounter for screening mammogram for malignant neoplasm of breast: Secondary | ICD-10-CM

## 2017-12-27 ENCOUNTER — Emergency Department (HOSPITAL_COMMUNITY): Payer: 59

## 2017-12-27 ENCOUNTER — Other Ambulatory Visit: Payer: Self-pay

## 2017-12-27 ENCOUNTER — Encounter (HOSPITAL_COMMUNITY): Payer: Self-pay | Admitting: Emergency Medicine

## 2017-12-27 ENCOUNTER — Emergency Department (HOSPITAL_COMMUNITY)
Admission: EM | Admit: 2017-12-27 | Discharge: 2017-12-28 | Disposition: A | Discharge status: Home / Self Care | Payer: 59 | Attending: Emergency Medicine | Admitting: Emergency Medicine

## 2017-12-27 DIAGNOSIS — Z87891 Personal history of nicotine dependence: Secondary | ICD-10-CM | POA: Diagnosis not present

## 2017-12-27 DIAGNOSIS — Z7982 Long term (current) use of aspirin: Secondary | ICD-10-CM | POA: Insufficient documentation

## 2017-12-27 DIAGNOSIS — Z79899 Other long term (current) drug therapy: Secondary | ICD-10-CM | POA: Insufficient documentation

## 2017-12-27 DIAGNOSIS — I10 Essential (primary) hypertension: Secondary | ICD-10-CM | POA: Insufficient documentation

## 2017-12-27 DIAGNOSIS — M79602 Pain in left arm: Secondary | ICD-10-CM | POA: Insufficient documentation

## 2017-12-27 DIAGNOSIS — M542 Cervicalgia: Secondary | ICD-10-CM | POA: Diagnosis not present

## 2017-12-27 DIAGNOSIS — R079 Chest pain, unspecified: Secondary | ICD-10-CM | POA: Diagnosis not present

## 2017-12-27 LAB — I-STAT TROPONIN, ED: Troponin i, poc: 0.01 ng/mL (ref 0.00–0.08)

## 2017-12-27 NOTE — ED Triage Notes (Signed)
C/o L arm and L neck aching x 1 week.  States she started feeling heart fluttering and SOB tonight.

## 2017-12-28 ENCOUNTER — Ambulatory Visit (HOSPITAL_BASED_OUTPATIENT_CLINIC_OR_DEPARTMENT_OTHER)
Admission: RE | Admit: 2017-12-28 | Discharge: 2017-12-28 | Disposition: A | Discharge status: Home / Self Care | Payer: 59 | Source: Ambulatory Visit | Attending: Emergency Medicine | Admitting: Emergency Medicine

## 2017-12-28 DIAGNOSIS — M7989 Other specified soft tissue disorders: Secondary | ICD-10-CM

## 2017-12-28 DIAGNOSIS — R52 Pain, unspecified: Secondary | ICD-10-CM | POA: Diagnosis not present

## 2017-12-28 LAB — BASIC METABOLIC PANEL
ANION GAP: 11 (ref 5–15)
BUN: 12 mg/dL (ref 6–20)
CHLORIDE: 105 mmol/L (ref 98–111)
CO2: 26 mmol/L (ref 22–32)
Calcium: 9.8 mg/dL (ref 8.9–10.3)
Creatinine, Ser: 0.88 mg/dL (ref 0.44–1.00)
GFR calc non Af Amer: >60 mL/min (ref >60)
Glucose, Bld: 138 mg/dL — ABNORMAL HIGH (ref 70–99)
Potassium: 3.9 mmol/L (ref 3.5–5.1)
Sodium: 142 mmol/L (ref 135–145)

## 2017-12-28 LAB — CBC
HEMATOCRIT: 39.8 % (ref 36.0–46.0)
HEMOGLOBIN: 11.7 g/dL — AB (ref 12.0–15.0)
MCH: 25.2 pg — ABNORMAL LOW (ref 26.0–34.0)
MCHC: 29.4 g/dL — AB (ref 30.0–36.0)
MCV: 85.8 fL (ref 80.0–100.0)
NRBC: 0 % (ref 0.0–0.2)
Platelets: 413 10*3/uL — ABNORMAL HIGH (ref 150–400)
RBC: 4.64 MIL/uL (ref 3.87–5.11)
RDW: 13.9 % (ref 11.5–15.5)
WBC: 9 10*3/uL (ref 4.0–10.5)

## 2017-12-28 MED ORDER — ENOXAPARIN SODIUM 150 MG/ML ~~LOC~~ SOLN
140.0000 mg | SUBCUTANEOUS | Status: DC
  Administered 2017-12-28: 140 mg via SUBCUTANEOUS
  Filled 2017-12-28: qty 0.93

## 2017-12-28 MED ORDER — ONDANSETRON 4 MG PO TBDP
8.0000 mg | ORAL_TABLET | Freq: Once | ORAL | Status: AC
  Administered 2017-12-28: 8 mg via ORAL
  Filled 2017-12-28: qty 2

## 2017-12-28 NOTE — Progress Notes (Signed)
LUE venous duplex       has been completed. Preliminary results can be found under CV proc through chart review. Landry Mellow, RDMS, RVT

## 2017-12-28 NOTE — Discharge Instructions (Signed)
Your evaluation in the emergency department has been reassuring.  We recommend the return in the morning for ultrasound of your left arm to rule out a blood clot.  Your lab tests today did not suggest concerning cardiac etiology as cause of your symptoms.  It may be that your symptoms were brought on by injury from overuse.  Take ibuprofen or Aleve for management of persistent discomfort.  Follow-up with your primary care doctor to ensure resolution of symptoms.

## 2017-12-28 NOTE — ED Provider Notes (Signed)
[facility] EMERGENCY DEPARTMENT Provider Note   CSN: [id number] Arrival date & time: 12/27/17  2302     History   Chief Complaint Chief Complaint  Patient presents with  . Neck Pain  . Arm Pain  . heart fluttering    HPI Gabriela [name] is a 57 y.o. female.  57 year old female with history of hypertension, chest pain, palpitations presents to the emergency department for evaluation of left arm pain.  States that she noticed pain around her left elbow 2 weeks ago.  It has been intermittent and waxing and waning in severity.  She has since developed progression of symptoms with radiation of her discomfort to her left neck.  She has tried to Tylenol for discomfort as well as aspirin without relief.  Reports some nausea which began 2 days ago, but no vomiting.  Also endorsing intermittent paresthesias in the left hand.  The patient has not had any chest pain, shortness of breath, leg swelling, fever, extremity numbness, weakness, syncope.  No personal history of ACS.  There is history of MI in her mother at the age of 57.  The patient recently underwent hip replacement in September.  She is not on chronic anticoagulation.  The history is provided by the patient. No language interpreter was used.  Neck Pain    Arm Pain     Past Medical History:  Diagnosis Date  . Anemia   . Arthritis    OA  . Benign colon polyp    adenomatous  . Chest pain    a. normal cors by cath in 2006 b. calcium score of 0 by Coronary CT in 11/2011  . Dysmenorrhea   . Fibroid   . Hip pain    left hip  . Hypertension   . Irregular heart beat   . Smoker    ONE PACK PER WEEK    Patient Active Problem List   Diagnosis Date Noted  . Primary osteoarthritis of hip 10/15/2017  . Primary osteoarthritis of left hip 10/02/2017  . Primary osteoarthritis of right knee 10/02/2017  . Fatigue 05/05/2015  . Constipation 05/05/2015  . Tobacco abuse 10/12/2014  . Insomnia 09/24/2014  .  Mastodynia, female 07/12/2014  . Ankle edema 06/04/2014  . Abdominal pain 02/02/2014  . Chest pain 09/23/2013  . RUQ pain 08/26/2013  . Sprain of ankle, unspecified site 06/08/2013  . Greater trochanteric bursitis of left hip 12/07/2011  . Hot flashes, menopausal 08/09/2011  . Dyspepsia 04/27/2011  . Stress 04/17/2011  . Ex-light cigarette smoker (1-9 per day)   . Hypertension   . Hx of adenomatous colonic polyps   . Overweight(278.02) 02/28/2009  . HLD (hyperlipidemia) 03/24/2008  . DEPRESSION, MAJOR, MILD 11/13/2007    Past Surgical History:  Procedure Laterality Date  . ABDOMINAL HYSTERECTOMY  06/2001   Toney Rakes, M.D. PARTIAL  . CARDIAC CATHETERIZATION  2003   Dr Gwenlyn Found  . CARDIOLITE MYOCARDIAL PERFUSION  06/07/03   NO PERFUSION ABNORMALITY. EF% 68%.  Marland Kitchen GYNECOLOGIC CRYOSURGERY  1993   DYSPLASIA  . LOWER EXT ARTERIAL  07/28/08   NORMAL  . TOTAL HIP ARTHROPLASTY Left 10/15/2017   Procedure: LEFT TOTAL HIP ARTHROPLASTY ANTERIOR APPROACH;  Surgeon: [name], MD;  Location: [facility] OR[facility];  Service: Orthopedics;  Laterality: Left;  . TRANSTHORACIC ECHOCARDIOGRAM  07/03/04   TRACE MR,TRACE TR,TRACE PVR  . UMBILICAL HERNIA REPAIR     AS A CHILD     OB History    Gravida  3  Para  1   Term  0   Preterm  0   AB  2   Living  1     SAB  0   TAB  2   Ectopic  0   Multiple  0   Live Births               Home Medications    Prior to Admission medications   Medication Sig Start Date End Date Taking? Authorizing Provider  aspirin EC 81 MG tablet Take 1 tablet (81 mg total) by mouth 2 (two) times daily. For DVT prophylaxis 10/15/17  Yes Prudencio Burly III, PA-C  Calcium Carb-Cholecalciferol (CALCIUM 600 + D PO) Take 1 tablet by mouth daily.   Yes [provider]  irbesartan-hydrochlorothiazide (AVALIDE) 150-12.5 MG tablet Take 1 tablet by mouth daily. 08/12/17  Yes [provider]  Ketotifen Fumarate (ALLERGY EYE DROPS OP) Place 1  drop into both eyes daily as needed (allergies).   Yes [provider]  Multiple Vitamin (MULTIVITAMIN WITH MINERALS) TABS tablet Take 1 tablet by mouth daily.   Yes [provider]  pravastatin (PRAVACHOL) 40 MG tablet Take 1 tablet (40 mg total) by mouth daily. Patient taking differently: Take 40 mg by mouth at bedtime.  08/09/16  Yes Sela Hilding, MD  TURMERIC PO Take 1 tablet by mouth 2 (two) times daily.   Yes [provider]  docusate sodium (COLACE) 100 MG capsule Take 1 capsule (100 mg total) by mouth 2 (two) times daily. To prevent constipation while taking pain medication. Patient not taking: Reported on 12/28/2017 10/15/17   Prudencio Burly III, PA-C  gabapentin (NEURONTIN) 300 MG capsule Take 1 capsule (300 mg total) by mouth 2 (two) times daily for 14 days. For 2 weeks post op for pain. Patient not taking: Reported on 12/28/2017 10/15/17 10/29/17  Prudencio Burly III, PA-C  hydrOXYzine (ATARAX/VISTARIL) 10 MG tablet Take 1 tablet (10 mg total) by mouth 3 (three) times daily as needed for itching. Patient not taking: Reported on 12/28/2017 04/18/16   Veatrice Bourbon, MD  methocarbamol (ROBAXIN) 500 MG tablet Take 1 tablet (500 mg total) by mouth every 6 (six) hours as needed for muscle spasms. Patient not taking: Reported on 12/28/2017 10/15/17   Prudencio Burly III, PA-C  ondansetron (ZOFRAN) 4 MG tablet Take 1 tablet (4 mg total) by mouth every 8 (eight) hours as needed for nausea or vomiting. Patient not taking: Reported on 12/28/2017 10/15/17   Prudencio Burly III, PA-C    Family History Family History  Problem Relation Age of Onset  . Heart disease Mother   . Hypertension Mother   . Stroke Father   . Colon cancer Neg Hx   . Stomach cancer Neg Hx     Social History Social History   Tobacco Use  . Smoking status: Former Smoker    Packs/day: 0.10    Years: 18.00    Pack years: 1.80    Types: Cigarettes    Last  attempt to quit: 07/14/2015    Years since quitting: 2.4  . Smokeless tobacco: Never Used  . Tobacco comment: only smokes 1 -2 cigarettes daily.  Substance Use Topics  . Alcohol use: Yes    Alcohol/week: 2.0 standard drinks    Types: 2 Glasses of wine per week    Comment: OCC  . Drug use: No     Allergies   Patient has no known allergies.   Review  of Systems Review of Systems  Musculoskeletal: Positive for neck pain.   Ten systems reviewed and are negative for acute change, except as noted in the HPI.    Physical Exam Updated Vital Signs BP (!) 138/53   Pulse 63   Temp (!) 97.5 F (36.4 C) (Oral)   Resp (!) 21   SpO2 99%   Physical Exam  Constitutional: She is oriented to person, place, and time. She appears well-developed and well-nourished. No distress.  Nontoxic appearing and in NAD  HENT:  Head: Normocephalic and atraumatic.  Eyes: Conjunctivae and EOM are normal. No scleral icterus.  Neck: Normal range of motion.  Cardiovascular: Normal rate, regular rhythm and intact distal pulses.  Pulmonary/Chest: Effort normal. No stridor. No respiratory distress. She has no wheezes. She has no rales.  Respirations even and unlabored. Lungs CTAB.  Musculoskeletal: Normal range of motion.  Mild soft tissue swelling to the medial aspect of the left AC fossa without erythema, induration, heat to touch. No BLE edema.  Neurological: She is alert and oriented to person, place, and time. She exhibits normal muscle tone. Coordination normal.  Sensation to light touch intact in BUE. Moving all extremities spontaneously.  Skin: Skin is warm and dry. No rash noted. She is not diaphoretic. No erythema. No pallor.  Psychiatric: She has a normal mood and affect. Her behavior is normal.  Nursing note and vitals reviewed.    ED Treatments / Results  Labs (all labs ordered are listed, but only abnormal results are displayed) Labs Reviewed  BASIC METABOLIC PANEL - Abnormal; Notable for  the following components:      Result Value   Glucose, Bld 138 (*)    All other components within normal limits  CBC - Abnormal; Notable for the following components:   Hemoglobin 11.7 (*)    MCH 25.2 (*)    MCHC 29.4 (*)    Platelets 413 (*)    All other components within normal limits  I-STAT TROPONIN, ED    EKG EKG Interpretation  Date/Time:  Friday December 27 2017 23:18:46 EST Ventricular Rate:  94 PR Interval:  136 QRS Duration: 78 QT Interval:  366 QTC Calculation: 457 R Axis:   11 Text Interpretation:  Normal sinus rhythm Possible Left atrial enlargement Borderline ECG When compared with ECG of 10/10/2017, No significant change was found Confirmed by Delora Fuel ([id number]) on 12/27/2017 11:53:00 PM   Radiology Dg Chest 2 View  Result Date: 12/28/2017 CLINICAL DATA:  Chest and lt arm pain for 2 days,some sob today hx [id number] EXAM: CHEST - 2 VIEW COMPARISON:  None. FINDINGS: Normal mediastinum and cardiac silhouette. Normal pulmonary vasculature. No evidence of effusion, infiltrate, or pneumothorax. No acute bony abnormality. IMPRESSION: No acute cardiopulmonary process. Electronically Signed   By: Suzy Bouchard M.D.   On: 12/28/2017 00:05    Procedures Procedures (including critical care time)  Medications Ordered in ED Medications  enoxaparin (LOVENOX) injection 140 mg (140 mg Subcutaneous Given 12/28/17 0151)  ondansetron (ZOFRAN-ODT) disintegrating tablet 8 mg (8 mg Oral Given 12/28/17 0152)     Initial Impression / Assessment and Plan / ED Course  I have reviewed the triage vital signs and the nursing notes.  Pertinent labs & imaging results that were available during my care of the patient were reviewed by me and considered in my medical decision making (see chart for details).     57 year old female presenting for left arm pain.  She had a cardiac work-up completed in  triage which is normal, reassuring.  Her symptoms sound quite atypical for emergent cardiac  etiology.  Mostly complaining of discomfort to her medial antecubital fossa where there is a small amount of soft tissue swelling.  Symptoms not consistent with cellulitis or abscess.  She has preserved range of motion of the left elbow.  Given her history of recent surgery, unable to completely exclude upper extremity DVT.  This is felt less likely, however, given absence of swelling distal to point of discomfort.  As a precaution, patient was given a dose of Lovenox in the ED.  She is to return in the morning for an ultrasound of her left upper extremity.  If negative, have also discussed the possibility of musculoskeletal etiology and localized inflammation from muscle or tendon injury.  The patient is left-hand dominant and this could have been brought on by overuse.  Encouraged continued use of NSAIDs.  Return precautions discussed and provided.  Patient discharged in stable condition with no unaddressed concerns.   Final Clinical Impressions(s) / ED Diagnoses   Final diagnoses:  Left arm pain    ED Discharge Orders         Ordered    UE VENOUS DUPLEX     12/28/17 0133           Antonietta Breach, PA-C 29/56/21 [id number]    Delora Fuel, MD 57/84/69 [phone]

## 2018-01-02 IMAGING — CR DG CHEST 2V
2 series · 2 of 2 positions shown · non-contrast
Comparison: Chest radiograph 08/07/2015

CLINICAL DATA: Chest pain and hypertension

EXAM:
CHEST  2 VIEW

[w chest pa]
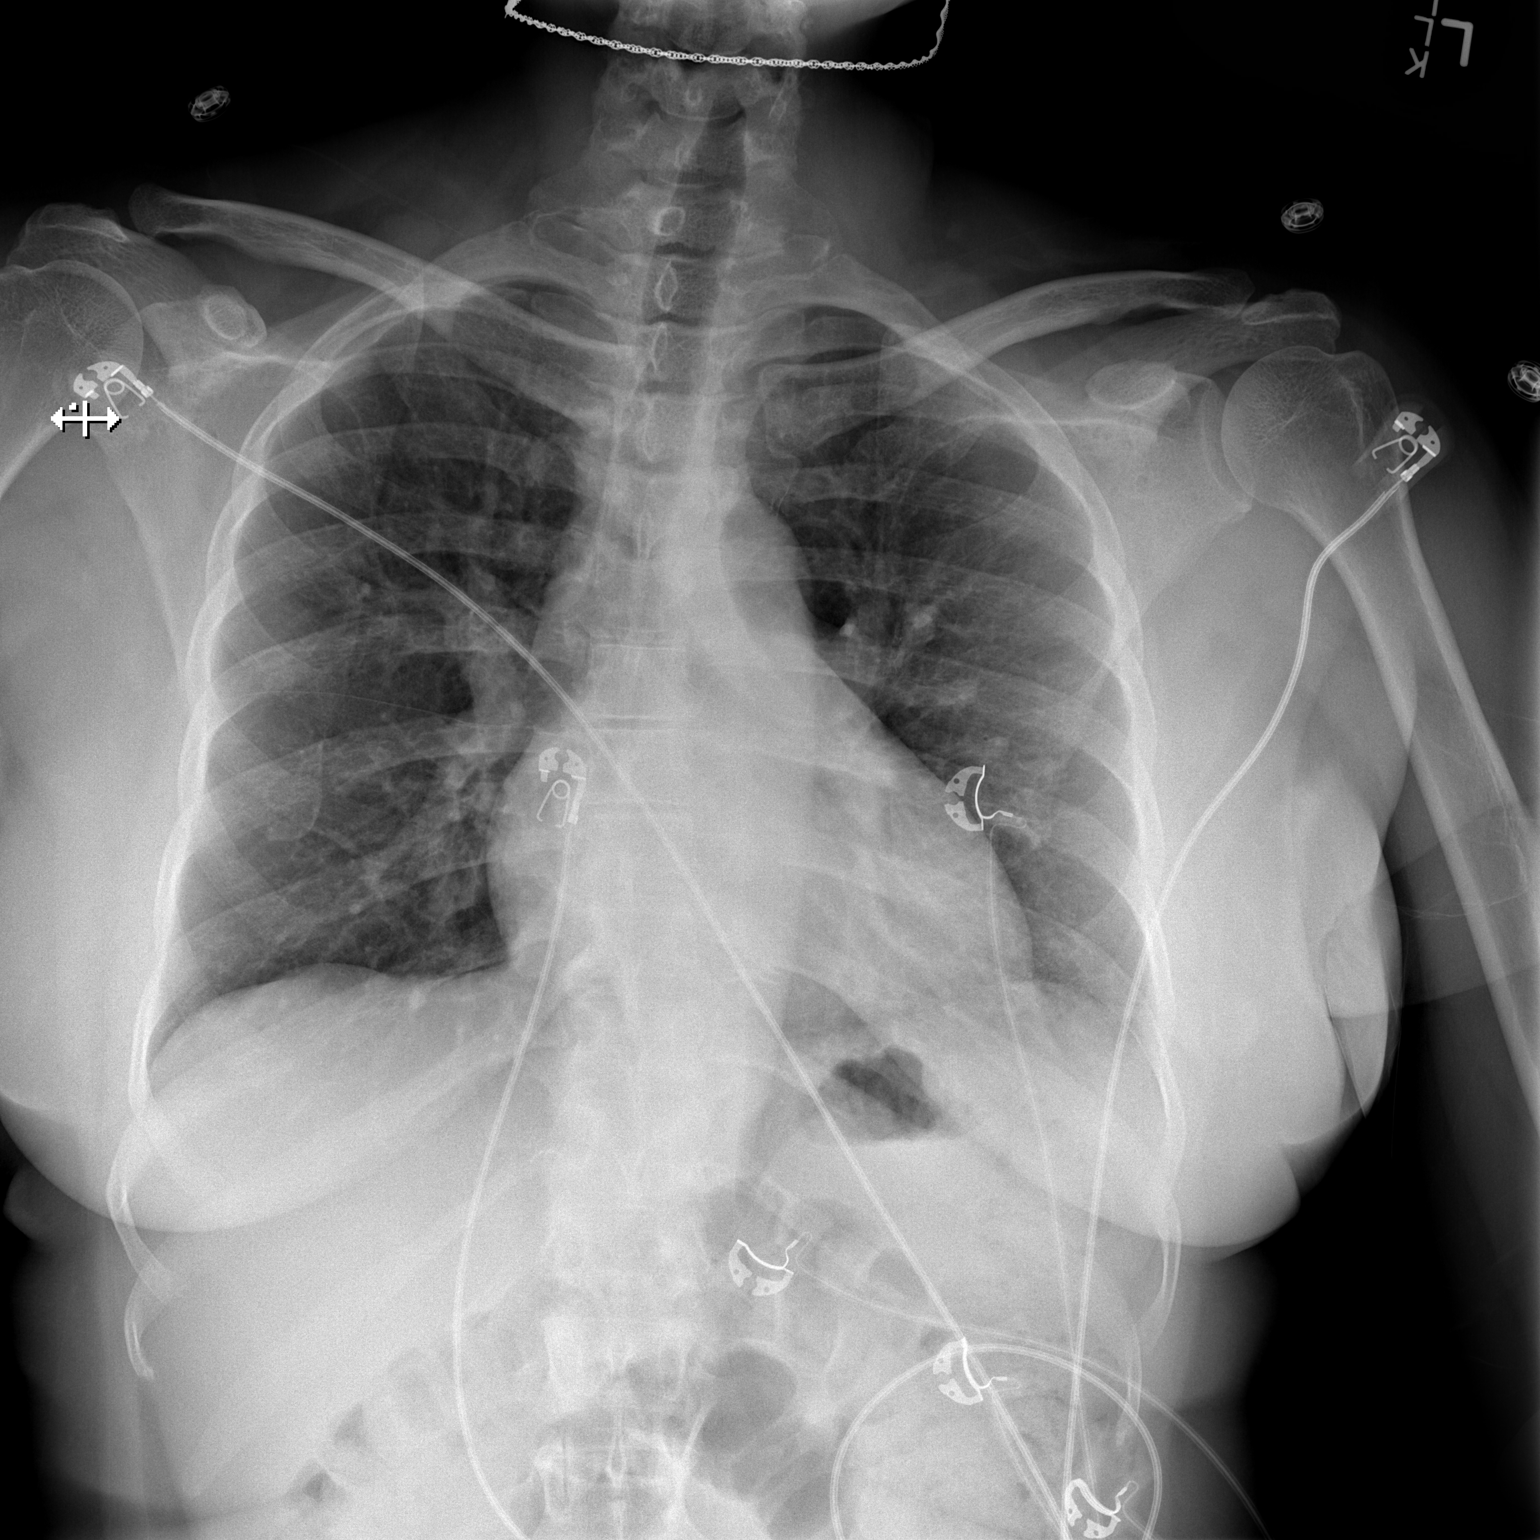

[w chest lat]
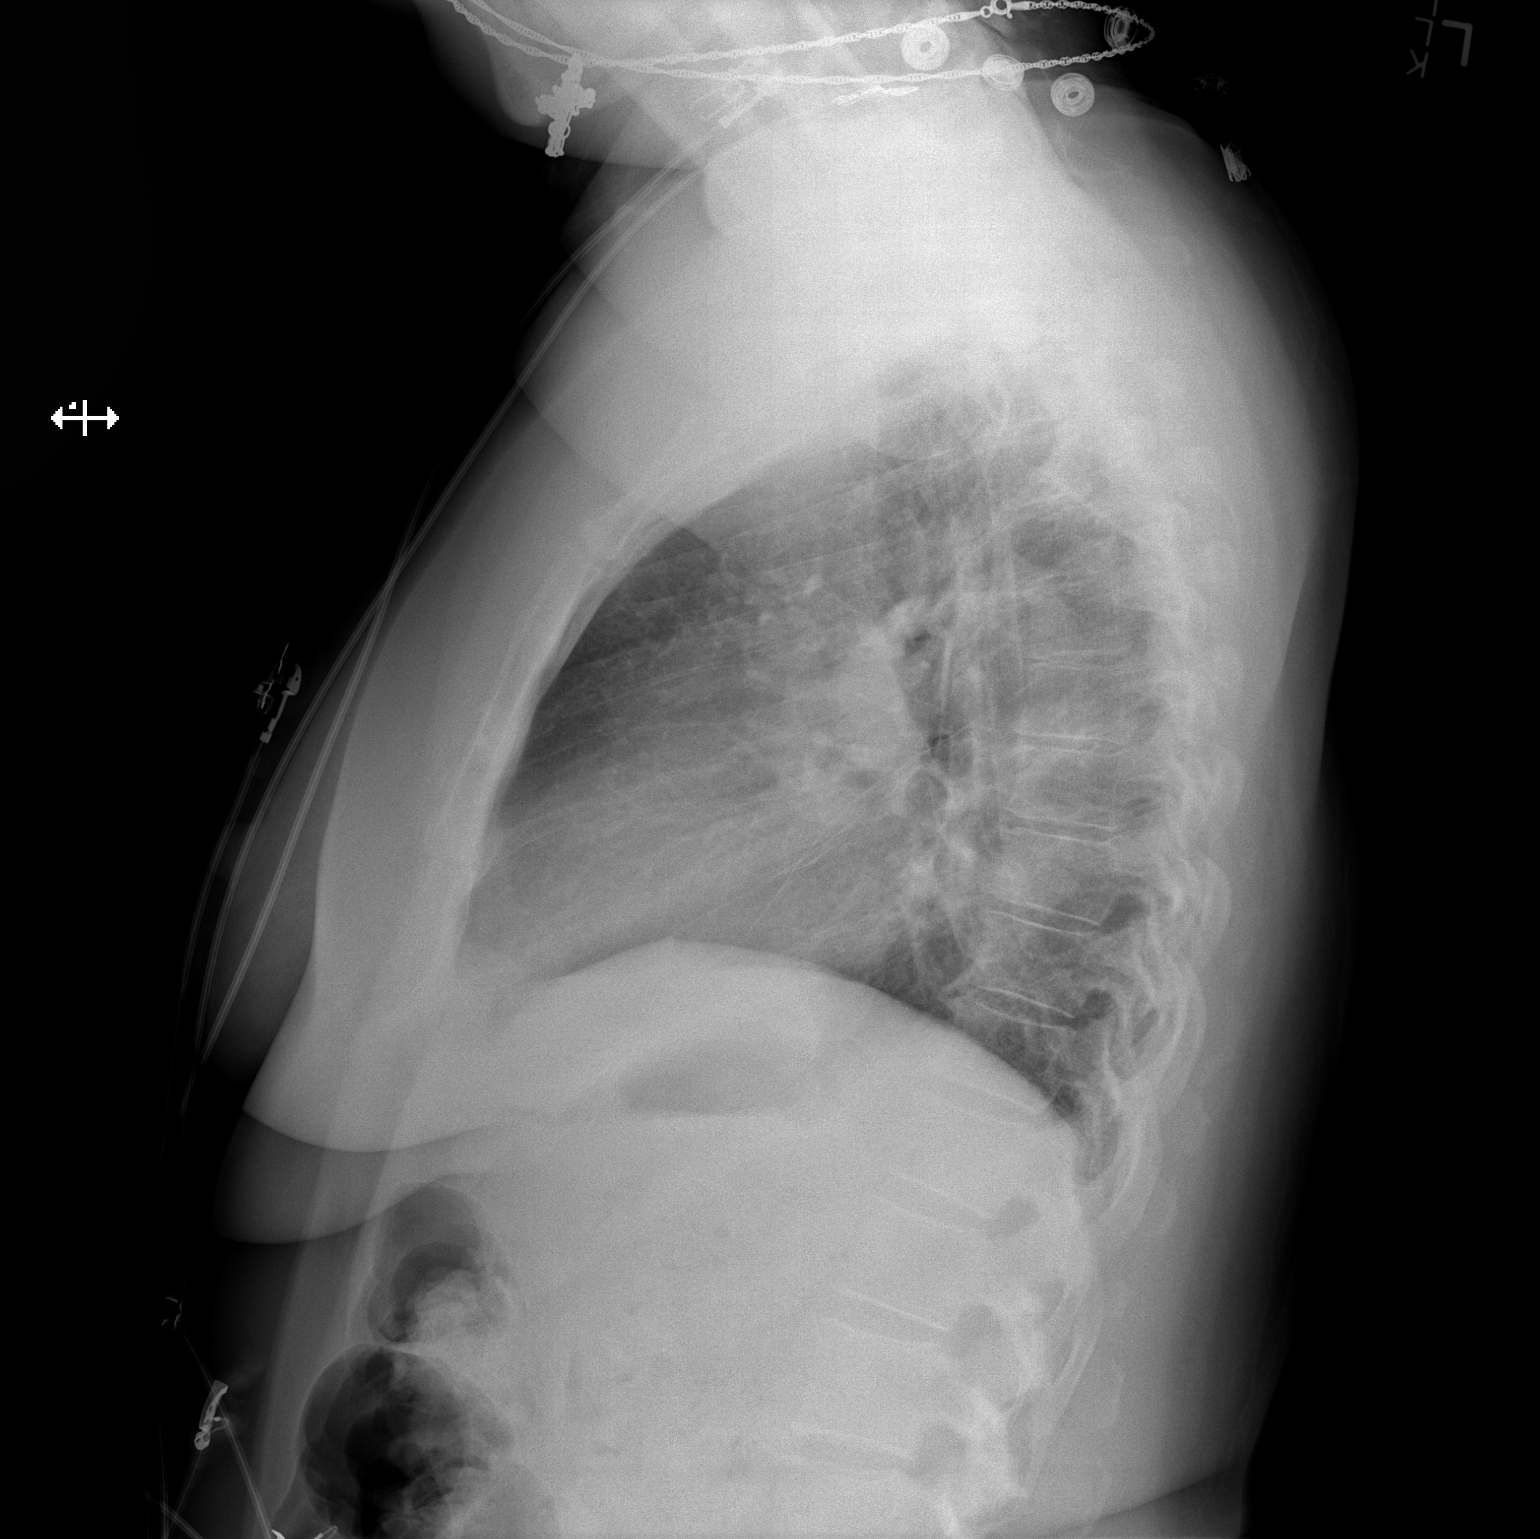

[2 of 2 positions shown; findings below may reference images not displayed]

FINDINGS: The heart size and mediastinal contours are within normal limits.
Both lungs are clear. The visualized skeletal structures are
unremarkable.
IMPRESSION: No active cardiopulmonary disease.

## 2018-01-23 ENCOUNTER — Ambulatory Visit: Payer: 59

## 2018-01-29 DIAGNOSIS — M1612 Unilateral primary osteoarthritis, left hip: Secondary | ICD-10-CM | POA: Diagnosis not present

## 2018-01-29 DIAGNOSIS — M545 Low back pain: Secondary | ICD-10-CM | POA: Diagnosis not present

## 2018-02-17 ENCOUNTER — Ambulatory Visit
Admission: RE | Admit: 2018-02-17 | Discharge: 2018-02-17 | Disposition: A | Discharge status: Home / Self Care | Payer: 59 | Source: Ambulatory Visit | Attending: Women's Health | Admitting: Women's Health

## 2018-02-17 DIAGNOSIS — Z1231 Encounter for screening mammogram for malignant neoplasm of breast: Secondary | ICD-10-CM

## 2018-02-19 ENCOUNTER — Other Ambulatory Visit: Payer: Self-pay | Admitting: Women's Health

## 2018-02-19 DIAGNOSIS — R928 Other abnormal and inconclusive findings on diagnostic imaging of breast: Secondary | ICD-10-CM

## 2018-02-21 ENCOUNTER — Other Ambulatory Visit: Payer: Self-pay | Admitting: Women's Health

## 2018-02-21 ENCOUNTER — Ambulatory Visit
Admission: RE | Admit: 2018-02-21 | Discharge: 2018-02-21 | Disposition: A | Discharge status: Home / Self Care | Payer: 59 | Source: Ambulatory Visit | Attending: Women's Health | Admitting: Women's Health

## 2018-02-21 DIAGNOSIS — R928 Other abnormal and inconclusive findings on diagnostic imaging of breast: Secondary | ICD-10-CM

## 2018-02-21 DIAGNOSIS — R922 Inconclusive mammogram: Secondary | ICD-10-CM | POA: Diagnosis not present

## 2018-02-21 DIAGNOSIS — N6011 Diffuse cystic mastopathy of right breast: Secondary | ICD-10-CM

## 2018-02-21 DIAGNOSIS — N6310 Unspecified lump in the right breast, unspecified quadrant: Secondary | ICD-10-CM | POA: Diagnosis not present

## 2018-03-18 DIAGNOSIS — R143 Flatulence: Secondary | ICD-10-CM | POA: Diagnosis not present

## 2018-03-18 DIAGNOSIS — K219 Gastro-esophageal reflux disease without esophagitis: Secondary | ICD-10-CM | POA: Diagnosis not present

## 2018-03-18 DIAGNOSIS — K5900 Constipation, unspecified: Secondary | ICD-10-CM | POA: Diagnosis not present

## 2018-04-11 DIAGNOSIS — J309 Allergic rhinitis, unspecified: Secondary | ICD-10-CM | POA: Diagnosis not present

## 2018-04-11 DIAGNOSIS — J069 Acute upper respiratory infection, unspecified: Secondary | ICD-10-CM | POA: Diagnosis not present

## 2018-04-11 DIAGNOSIS — R05 Cough: Secondary | ICD-10-CM | POA: Diagnosis not present

## 2018-05-23 DIAGNOSIS — R6883 Chills (without fever): Secondary | ICD-10-CM | POA: Diagnosis not present

## 2018-05-23 DIAGNOSIS — J069 Acute upper respiratory infection, unspecified: Secondary | ICD-10-CM | POA: Diagnosis not present

## 2018-05-23 DIAGNOSIS — J309 Allergic rhinitis, unspecified: Secondary | ICD-10-CM | POA: Diagnosis not present

## 2018-06-09 DIAGNOSIS — H43391 Other vitreous opacities, right eye: Secondary | ICD-10-CM | POA: Diagnosis not present

## 2018-08-13 ENCOUNTER — Ambulatory Visit: Payer: 59 | Admitting: Women's Health

## 2018-08-13 ENCOUNTER — Encounter: Payer: Self-pay | Admitting: Women's Health

## 2018-08-13 ENCOUNTER — Other Ambulatory Visit: Payer: Self-pay

## 2018-08-13 VITALS — BP 132/80

## 2018-08-13 DIAGNOSIS — B373 Candidiasis of vulva and vagina: Secondary | ICD-10-CM

## 2018-08-13 DIAGNOSIS — R300 Dysuria: Secondary | ICD-10-CM | POA: Diagnosis not present

## 2018-08-13 DIAGNOSIS — B3731 Acute candidiasis of vulva and vagina: Secondary | ICD-10-CM

## 2018-08-13 DIAGNOSIS — N898 Other specified noninflammatory disorders of vagina: Secondary | ICD-10-CM | POA: Diagnosis not present

## 2018-08-13 LAB — WET PREP FOR TRICH, YEAST, CLUE

## 2018-08-13 MED ORDER — TERCONAZOLE 0.4 % VA CREA: 1.0000 | TOPICAL_CREAM | Freq: Every day | VAGINAL | 0 refills | Status: DC

## 2018-08-13 NOTE — Patient Instructions (Signed)
Vaginal Yeast infection, Adult  Vaginal yeast infection is a condition that causes vaginal discharge as well as soreness, swelling, and redness (inflammation) of the vagina. This is a common condition. Some women get this infection frequently. What are the causes? This condition is caused by a change in the normal balance of the yeast (candida) and bacteria that live in the vagina. This change causes an overgrowth of yeast, which causes the inflammation. What increases the risk? The condition is more likely to develop in women who:  Take antibiotic medicines.  Have diabetes.  Take birth control pills.  Are pregnant.  Douche often.  Have a weak body defense system (immune system).  Have been taking steroid medicines for a long time.  Frequently wear tight clothing. What are the signs or symptoms? Symptoms of this condition include:  White, thick, creamy vaginal discharge.  Swelling, itching, redness, and irritation of the vagina. The lips of the vagina (vulva) may be affected as well.  Pain or a burning feeling while urinating.  Pain during sex. How is this diagnosed? This condition is diagnosed based on:  Your medical history.  A physical exam.  A pelvic exam. Your health care provider will examine a sample of your vaginal discharge under a microscope. Your health care provider may send this sample for testing to confirm the diagnosis. How is this treated? This condition is treated with medicine. Medicines may be over-the-counter or prescription. You may be told to use one or more of the following:  Medicine that is taken by mouth (orally).  Medicine that is applied as a cream (topically).  Medicine that is inserted directly into the vagina (suppository). Follow these instructions at home:  Lifestyle  Do not have sex until your health care provider approves. Tell your sex partner that you have a yeast infection. That person should go to his or her health care  provider and ask if they should also be treated.  Do not wear tight clothes, such as pantyhose or tight pants.  Wear breathable cotton underwear. General instructions  Take or apply over-the-counter and prescription medicines only as told by your health care provider.  Eat more yogurt. This may help to keep your yeast infection from returning.  Do not use tampons until your health care provider approves.  Try taking a sitz bath to help with discomfort. This is a warm water bath that is taken while you are sitting down. The water should only come up to your hips and should cover your buttocks. Do this 3-4 times per day or as told by your health care provider.  Do not douche.  If you have diabetes, keep your blood sugar levels under control.  Keep all follow-up visits as told by your health care provider. This is important. Contact a health care provider if:  You have a fever.  Your symptoms go away and then return.  Your symptoms do not get better with treatment.  Your symptoms get worse.  You have new symptoms.  You develop blisters in or around your vagina.  You have blood coming from your vagina and it is not your menstrual period.  You develop pain in your abdomen. Summary  Vaginal yeast infection is a condition that causes discharge as well as soreness, swelling, and redness (inflammation) of the vagina.  This condition is treated with medicine. Medicines may be over-the-counter or prescription.  Take or apply over-the-counter and prescription medicines only as told by your health care provider.  Do not douche.   Do not have sex or use tampons until your health care provider approves.  Contact a health care provider if your symptoms do not get better with treatment or your symptoms go away and then return. This information is not intended to replace advice given to you by your health care provider. Make sure you discuss any questions you have with your health care  provider. Document Released: 10/18/2004 Document Revised: 05/27/2017 Document Reviewed: 05/27/2017 Elsevier Patient Education  2020 [employer]  

## 2018-08-13 NOTE — Progress Notes (Signed)
58 year old SBF G 3P1 presents with complaint of vaginal itching mostly external for the past week.  Same partner negative STD screen.  Denies vaginal odor, urinary symptoms, abdominal/back pain or fever.  Medical problems include hypercholesteremia, hypertension, osteoarthritis.  2003 TVH for fibroids on no HRT.  Exam: Appears well.  Abdomen soft, nontender, external genitalia within normal limits minimal erythema at introitus, speculum exam scant discharge, vaginal walls minimal erythema, wet prep negative.  Clinical yeast  Plan: Terazol 7 apply externally at bedtime as needed instructed to call if continued symptoms.  Reassurance given,

## 2018-08-14 LAB — URINALYSIS, COMPLETE W/RFL CULTURE
Bacteria, UA: NONE SEEN /HPF
Bilirubin Urine: NEGATIVE
Glucose, UA: NEGATIVE
Hgb urine dipstick: NEGATIVE
Hyaline Cast: NONE SEEN /LPF
Ketones, ur: NEGATIVE
Leukocyte Esterase: NEGATIVE
Nitrites, Initial: NEGATIVE
Protein, ur: NEGATIVE
RBC / HPF: NONE SEEN /HPF (ref 0–2)
Specific Gravity, Urine: 1.015 (ref 1.001–1.03)
WBC, UA: NONE SEEN /HPF (ref 0–5)
pH: 7.5 (ref 5.0–8.0)

## 2018-08-14 LAB — NO CULTURE INDICATED

## 2018-08-25 ENCOUNTER — Other Ambulatory Visit: Payer: 59

## 2018-08-25 ENCOUNTER — Other Ambulatory Visit: Payer: Self-pay | Admitting: Women's Health

## 2018-08-25 ENCOUNTER — Other Ambulatory Visit: Payer: Self-pay

## 2018-08-25 ENCOUNTER — Ambulatory Visit
Admission: RE | Admit: 2018-08-25 | Discharge: 2018-08-25 | Disposition: A | Discharge status: Home / Self Care | Payer: 59 | Source: Ambulatory Visit | Attending: Women's Health | Admitting: Women's Health

## 2018-08-25 DIAGNOSIS — N6011 Diffuse cystic mastopathy of right breast: Secondary | ICD-10-CM

## 2018-09-10 ENCOUNTER — Other Ambulatory Visit: Payer: Self-pay

## 2018-09-10 ENCOUNTER — Encounter: Payer: Self-pay | Admitting: Women's Health

## 2018-09-10 ENCOUNTER — Ambulatory Visit (INDEPENDENT_AMBULATORY_CARE_PROVIDER_SITE_OTHER): Payer: 59 | Admitting: Women's Health

## 2018-09-10 VITALS — BP 130/80 | Wt 214.0 lb

## 2018-09-10 DIAGNOSIS — Z13820 Encounter for screening for osteoporosis: Secondary | ICD-10-CM

## 2018-09-10 DIAGNOSIS — Z01419 Encounter for gynecological examination (general) (routine) without abnormal findings: Secondary | ICD-10-CM | POA: Diagnosis not present

## 2018-09-10 NOTE — Progress Notes (Signed)
Gabriela [name] 1960/06/23 [id number]    History:    Presents for annual exam.  2003 TVH for fibroids on no HRT.  Normal Pap and mammogram history.  Last mammogram 08/2027 left breast tenderness right breast/probable cyst 6 months follow-up scheduled.  Tenderness has now resolved.  Quit smoking. 09/2017 hip replacement for osteo-arthritis doing well.  2017 2 benign colon polyps 5-year follow-up.  Primary care manages hypercholesteremia, hypertension.  Same partner.  Past medical history, past surgical history, family history and social history were all reviewed and documented in the EPIC chart.  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  Small wedding due to COVID..  ROS:  A ROS was performed and pertinent positives and negatives are included.  Exam:  Vitals:   09/10/18 0912  BP: 130/80  Weight: 214 lb (97.1 kg)   Body mass index is 33.52 kg/m.   General appearance:  Normal Thyroid:  Symmetrical, normal in size, without palpable masses or nodularity. Respiratory  Auscultation:  Clear without wheezing or rhonchi Cardiovascular  Auscultation:  Regular rate, without rubs, murmurs or gallops  Edema/varicosities:  Not grossly evident Abdominal  Soft,nontender, without masses, guarding or rebound.  Liver/spleen:  No organomegaly noted  Hernia:  None appreciated  Skin  Inspection:  Grossly normal   Breasts: Examined lying and sitting.     Right: Without masses, retractions, discharge or axillary adenopathy.     Left: Without masses, retractions, discharge or axillary adenopathy. Gentitourinary   Inguinal/mons:  Normal without inguinal adenopathy  External genitalia:  Normal  BUS/Urethra/Skene's glands:  Normal  Vagina:  Normal  Cervix: And uterus absent   Adnexa/parametria:     Rt: Without masses or tenderness.   Lt: Without masses or tenderness.  Anus and perineum: Normal  Digital rectal exam: Normal sphincter tone without palpated masses or tenderness  Assessment/Plan:  58 y.o.  SBF G3 P1 for annual exam with complaint of left hip pain, has follow-up scheduled (09/2017 hip replacement)  2003 TVH for fibroids /no HRT Hypertension, hypercholesteremia-primary care manages labs and meds Obesity  Plan: SBEs, continue annual screening mammogram in 6 months follow-up for stability of probable cyst.  Increase regular cardio and balance type exercise, calcium rich foods, vitamin D 2000 daily encouraged.  Decrease calorie/carbs.  Schedule DEXA.  Reviewed importance of home safety, fall prevention, lives alone.  Shingrex discussed.  [place], 9:54 AM 09/10/2018

## 2018-09-10 NOTE — Patient Instructions (Addendum)
Vit d 3  2000 iu daily  Health Maintenance for Postmenopausal Women Menopause is a normal process in which your ability to get pregnant comes to an end. This process happens slowly over many months or years, usually between the ages of 48 and 28. Menopause is complete when you have missed your menstrual periods for 12 months. It is important to talk with your health care provider about some of the most common conditions that affect women after menopause (postmenopausal women). These include heart disease, cancer, and bone loss (osteoporosis). Adopting a healthy lifestyle and getting preventive care can help to promote your health and wellness. The actions you take can also lower your chances of developing some of these common conditions. What should I know about menopause? During menopause, you may get a number of symptoms, such as:  Hot flashes. These can be moderate or severe.  Night sweats.  Decrease in sex drive.  Mood swings.  Headaches.  Tiredness.  Irritability.  Memory problems.  Insomnia. Choosing to treat or not to treat these symptoms is a decision that you make with your health care provider. Do I need hormone replacement therapy?  Hormone replacement therapy is effective in treating symptoms that are caused by menopause, such as hot flashes and night sweats.  Hormone replacement carries certain risks, especially as you become older. If you are thinking about using estrogen or estrogen with progestin, discuss the benefits and risks with your health care provider. What is my risk for heart disease and stroke? The risk of heart disease, heart attack, and stroke increases as you age. One of the causes may be a change in the body's hormones during menopause. This can affect how your body uses dietary fats, triglycerides, and cholesterol. Heart attack and stroke are medical emergencies. There are many things that you can do to help prevent heart disease and stroke. Watch your  blood pressure  High blood pressure causes heart disease and increases the risk of stroke. This is more likely to develop in people who have high blood pressure readings, are of African descent, or are overweight.  Have your blood pressure checked: ? Every 3-5 years if you are 33-38 years of age. ? Every year if you are 70 years old or older. Eat a healthy diet   Eat a diet that includes plenty of vegetables, fruits, low-fat dairy products, and lean protein.  Do not eat a lot of foods that are high in solid fats, added sugars, or sodium. Get regular exercise Get regular exercise. This is one of the most important things you can do for your health. Most adults should:  Try to exercise for at least 150 minutes each week. The exercise should increase your heart rate and make you sweat (moderate-intensity exercise).  Try to do strengthening exercises at least twice each week. Do these in addition to the moderate-intensity exercise.  Spend less time sitting. Even light physical activity can be beneficial. Other tips  Work with your health care provider to achieve or maintain a healthy weight.  Do not use any products that contain nicotine or tobacco, such as cigarettes, e-cigarettes, and chewing tobacco. If you need help quitting, ask your health care provider.  Know your numbers. Ask your health care provider to check your cholesterol and your blood sugar (glucose). Continue to have your blood tested as directed by your health care provider. Do I need screening for cancer? Depending on your health history and family history, you may need to have cancer  screening at different stages of your life. This may include screening for:  Breast cancer.  Cervical cancer.  Lung cancer.  Colorectal cancer. What is my risk for osteoporosis? After menopause, you may be at increased risk for osteoporosis. Osteoporosis is a condition in which bone destruction happens more quickly than new bone  creation. To help prevent osteoporosis or the bone fractures that can happen because of osteoporosis, you may take the following actions:  If you are 22-64 years old, get at least 1,000 mg of calcium and at least 600 mg of vitamin D per day.  If you are older than age 70 but younger than age 69, get at least 1,200 mg of calcium and at least 600 mg of vitamin D per day.  If you are older than age 28, get at least 1,200 mg of calcium and at least 800 mg of vitamin D per day. Smoking and drinking excessive alcohol increase the risk of osteoporosis. Eat foods that are rich in calcium and vitamin D, and do weight-bearing exercises several times each week as directed by your health care provider. How does menopause affect my mental health? Depression may occur at any age, but it is more common as you become older. Common symptoms of depression include:  Low or sad mood.  Changes in sleep patterns.  Changes in appetite or eating patterns.  Feeling an overall lack of motivation or enjoyment of activities that you previously enjoyed.  Frequent crying spells. Talk with your health care provider if you think that you are experiencing depression. General instructions See your health care provider for regular wellness exams and vaccines. This may include:  Scheduling regular health, dental, and eye exams.  Getting and maintaining your vaccines. These include: ? Influenza vaccine. Get this vaccine each year before the flu season begins. ? Pneumonia vaccine. ? Shingles vaccine. ? Tetanus, diphtheria, and pertussis (Tdap) booster vaccine. Your health care provider may also recommend other immunizations. Tell your health care provider if you have ever been abused or do not feel safe at home. Summary  Menopause is a normal process in which your ability to get pregnant comes to an end.  This condition causes hot flashes, night sweats, decreased interest in sex, mood swings, headaches, or lack of  sleep.  Treatment for this condition may include hormone replacement therapy.  Take actions to keep yourself healthy, including exercising regularly, eating a healthy diet, watching your weight, and checking your blood pressure and blood sugar levels.  Get screened for cancer and depression. Make sure that you are up to date with all your vaccines. This information is not intended to replace advice given to you by your health care provider. Make sure you discuss any questions you have with your health care provider. Document Released: 03/02/2005 Document Revised: 01/01/2018 Document Reviewed: 01/01/2018 Elsevier Patient Education  2020 [employer] for Diabetes Mellitus, Adult  Carbohydrate counting is a method of keeping track of how many carbohydrates you eat. Eating carbohydrates naturally increases the amount of sugar (glucose) in the blood. Counting how many carbohydrates you eat helps keep your blood glucose within normal limits, which helps you manage your diabetes (diabetes mellitus). It is important to know how many carbohydrates you can safely have in each meal. This is different for every person. A diet and nutrition specialist (registered dietitian) can help you make a meal plan and calculate how many carbohydrates you should have at each meal and snack. Carbohydrates are found in the  following foods:  Grains, such as breads and cereals.  Dried beans and soy products.  Starchy vegetables, such as potatoes, peas, and corn.  Fruit and fruit juices.  Milk and yogurt.  Sweets and snack foods, such as cake, cookies, candy, chips, and soft drinks. How do I count carbohydrates? There are two ways to count carbohydrates in food. You can use either of the methods or a combination of both. Reading "Nutrition Facts" on packaged food The "Nutrition Facts" list is included on the labels of almost all packaged foods and beverages in the [place] It includes:  The  serving size.  Information about nutrients in each serving, including the grams (g) of carbohydrate per serving. To use the "Nutrition Facts":  Decide how many servings you will have.  Multiply the number of servings by the number of carbohydrates per serving.  The resulting number is the total amount of carbohydrates that you will be having. Learning standard serving sizes of other foods When you eat carbohydrate foods that are not packaged or do not include "Nutrition Facts" on the label, you need to measure the servings in order to count the amount of carbohydrates:  Measure the foods that you will eat with a food scale or measuring cup, if needed.  Decide how many standard-size servings you will eat.  Multiply the number of servings by 15. Most carbohydrate-rich foods have about 15 g of carbohydrates per serving. ? For example, if you eat 8 oz (170 g) of strawberries, you will have eaten 2 servings and 30 g of carbohydrates (2 servings x 15 g = 30 g).  For foods that have more than one food mixed, such as soups and casseroles, you must count the carbohydrates in each food that is included. The following list contains standard serving sizes of common carbohydrate-rich foods. Each of these servings has about 15 g of carbohydrates:   hamburger bun or  English muffin.   oz (15 mL) syrup.   oz (14 g) jelly.  1 slice of bread.  1 six-inch tortilla.  3 oz (85 g) cooked rice or pasta.  4 oz (113 g) cooked dried beans.  4 oz (113 g) starchy vegetable, such as peas, corn, or potatoes.  4 oz (113 g) hot cereal.  4 oz (113 g) mashed potatoes or  of a large baked potato.  4 oz (113 g) canned or frozen fruit.  4 oz (120 mL) fruit juice.  4-6 crackers.  6 chicken nuggets.  6 oz (170 g) unsweetened dry cereal.  6 oz (170 g) plain fat-free yogurt or yogurt sweetened with artificial sweeteners.  8 oz (240 mL) milk.  8 oz (170 g) fresh fruit or one small piece of fruit.   24 oz (680 g) popped popcorn. Example of carbohydrate counting Sample meal  3 oz (85 g) chicken breast.  6 oz (170 g) brown rice.  4 oz (113 g) corn.  8 oz (240 mL) milk.  8 oz (170 g) strawberries with sugar-free whipped topping. Carbohydrate calculation 1. Identify the foods that contain carbohydrates: ? Rice. ? Corn. ? Milk. ? Strawberries. 2. Calculate how many servings you have of each food: ? 2 servings rice. ? 1 serving corn. ? 1 serving milk. ? 1 serving strawberries. 3. Multiply each number of servings by 15 g: ? 2 servings rice x 15 g = 30 g. ? 1 serving corn x 15 g = 15 g. ? 1 serving milk x 15 g = 15 g. ?  1 serving strawberries x 15 g = 15 g. 4. Add together all of the amounts to find the total grams of carbohydrates eaten: ? 30 g + 15 g + 15 g + 15 g = 75 g of carbohydrates total. Summary  Carbohydrate counting is a method of keeping track of how many carbohydrates you eat.  Eating carbohydrates naturally increases the amount of sugar (glucose) in the blood.  Counting how many carbohydrates you eat helps keep your blood glucose within normal limits, which helps you manage your diabetes.  A diet and nutrition specialist (registered dietitian) can help you make a meal plan and calculate how many carbohydrates you should have at each meal and snack. This information is not intended to replace advice given to you by your health care provider. Make sure you discuss any questions you have with your health care provider. Document Released: 01/08/2005 Document Revised: 08/02/2016 Document Reviewed: 06/22/2015 Elsevier Patient Education  2020 [employer].

## 2018-09-17 ENCOUNTER — Encounter: Payer: 59 | Admitting: Women's Health

## 2018-10-20 ENCOUNTER — Other Ambulatory Visit: Payer: Self-pay

## 2018-10-21 ENCOUNTER — Ambulatory Visit (INDEPENDENT_AMBULATORY_CARE_PROVIDER_SITE_OTHER): Payer: 59

## 2018-10-21 ENCOUNTER — Other Ambulatory Visit: Payer: Self-pay | Admitting: Women's Health

## 2018-10-21 ENCOUNTER — Encounter: Payer: Self-pay | Admitting: Gynecology

## 2018-10-21 DIAGNOSIS — Z13820 Encounter for screening for osteoporosis: Secondary | ICD-10-CM

## 2018-10-21 DIAGNOSIS — Z780 Asymptomatic menopausal state: Secondary | ICD-10-CM | POA: Diagnosis not present

## 2018-12-21 ENCOUNTER — Emergency Department (HOSPITAL_COMMUNITY)
Admission: EM | Admit: 2018-12-21 | Discharge: 2018-12-21 | Disposition: A | Discharge status: Home / Self Care | Payer: 59 | Attending: Emergency Medicine | Admitting: Emergency Medicine

## 2018-12-21 ENCOUNTER — Other Ambulatory Visit: Payer: Self-pay

## 2018-12-21 ENCOUNTER — Encounter (HOSPITAL_COMMUNITY): Payer: Self-pay | Admitting: Emergency Medicine

## 2018-12-21 DIAGNOSIS — Z79899 Other long term (current) drug therapy: Secondary | ICD-10-CM | POA: Insufficient documentation

## 2018-12-21 DIAGNOSIS — Z7982 Long term (current) use of aspirin: Secondary | ICD-10-CM | POA: Diagnosis not present

## 2018-12-21 DIAGNOSIS — M25551 Pain in right hip: Secondary | ICD-10-CM | POA: Insufficient documentation

## 2018-12-21 DIAGNOSIS — Z87891 Personal history of nicotine dependence: Secondary | ICD-10-CM | POA: Insufficient documentation

## 2018-12-21 DIAGNOSIS — M25552 Pain in left hip: Secondary | ICD-10-CM | POA: Diagnosis not present

## 2018-12-21 DIAGNOSIS — I10 Essential (primary) hypertension: Secondary | ICD-10-CM | POA: Insufficient documentation

## 2018-12-21 MED ORDER — HYDROCODONE-ACETAMINOPHEN 5-325 MG PO TABS: 1.0000 | ORAL_TABLET | ORAL | 0 refills | Status: DC | PRN

## 2018-12-21 MED ORDER — PREDNISONE 10 MG PO TABS: ORAL_TABLET | ORAL | 0 refills | Status: DC

## 2018-12-21 NOTE — Discharge Instructions (Signed)
See your Orthopaedist for evaluation   

## 2018-12-21 NOTE — ED Provider Notes (Signed)
[facility]PART[facility] Provider Note   CSN: [id number] Arrival date & time: 12/21/18  1156     History   Chief Complaint Chief Complaint  Patient presents with  . Hip Pain    HPI Gabriela [name] is a 58 y.o. female.     The history is provided by the patient. No language interpreter was used.  Hip Pain This is a new problem. Episode onset: 1 year. The problem occurs constantly. The problem has been gradually worsening. Nothing aggravates the symptoms. Nothing relieves the symptoms. She has tried nothing for the symptoms. The treatment provided no relief.  Pt reports she has had pain on and off since she had a hip replacement.    Past Medical History:  Diagnosis Date  . Anemia   . Arthritis    OA  . Benign colon polyp    adenomatous  . Chest pain    a. normal cors by cath in 2006 b. calcium score of 0 by Coronary CT in 11/2011  . Dysmenorrhea   . Fibroid   . Hip pain    left hip  . Hypertension   . Irregular heart beat   . Smoker    ONE PACK PER WEEK    Patient Active Problem List   Diagnosis Date Noted  . Primary osteoarthritis of hip 10/15/2017  . Primary osteoarthritis of left hip 10/02/2017  . Primary osteoarthritis of right knee 10/02/2017  . Fatigue 05/05/2015  . Constipation 05/05/2015  . Tobacco abuse 10/12/2014  . Insomnia 09/24/2014  . Mastodynia, female 07/12/2014  . Ankle edema 06/04/2014  . Abdominal pain 02/02/2014  . Chest pain 09/23/2013  . RUQ pain 08/26/2013  . Sprain of ankle, unspecified site 06/08/2013  . Greater trochanteric bursitis of left hip 12/07/2011  . Hot flashes, menopausal 08/09/2011  . Dyspepsia 04/27/2011  . Stress 04/17/2011  . Ex-light cigarette smoker (1-9 per day)   . Hypertension   . Hx of adenomatous colonic polyps   . Overweight(278.02) 02/28/2009  . HLD (hyperlipidemia) 03/24/2008  . DEPRESSION, MAJOR, MILD 11/13/2007    Past Surgical History:  Procedure Laterality Date  .  ABDOMINAL HYSTERECTOMY  06/2001   Toney Rakes, M.D. PARTIAL  . CARDIAC CATHETERIZATION  2003   Dr Gwenlyn Found  . CARDIOLITE MYOCARDIAL PERFUSION  06/07/03   NO PERFUSION ABNORMALITY. EF% 68%.  Marland Kitchen GYNECOLOGIC CRYOSURGERY  1993   DYSPLASIA  . LOWER EXT ARTERIAL  07/28/08   NORMAL  . TOTAL HIP ARTHROPLASTY Left 10/15/2017   Procedure: LEFT TOTAL HIP ARTHROPLASTY ANTERIOR APPROACH;  Surgeon: [name], MD;  Location: [facility];  Service: Orthopedics;  Laterality: Left;  . TRANSTHORACIC ECHOCARDIOGRAM  07/03/04   TRACE MR,TRACE TR,TRACE PVR  . UMBILICAL HERNIA REPAIR     AS A CHILD     OB History    Gravida  3   Para  1   Term  0   Preterm  0   AB  2   Living  1     SAB  0   TAB  2   Ectopic  0   Multiple  0   Live Births               Home Medications    Prior to Admission medications   Medication Sig Start Date End Date Taking? Authorizing Provider  aspirin EC 81 MG tablet Take 1 tablet (81 mg total) by mouth 2 (two) times daily. For DVT prophylaxis 10/15/17  Prudencio Burly III, PA-C  Calcium Carb-Cholecalciferol (CALCIUM 600 + D PO) Take 1 tablet by mouth daily.    [provider]  HYDROcodone-acetaminophen (NORCO/VICODIN) 5-325 MG tablet Take 1 tablet by mouth every 4 (four) hours as needed. 12/21/18   Fransico Meadow, PA-C  hydrOXYzine (ATARAX/VISTARIL) 10 MG tablet Take 1 tablet (10 mg total) by mouth 3 (three) times daily as needed for itching. 04/18/16   Haney, Amedeo Plenty, MD  irbesartan-hydrochlorothiazide (AVALIDE) 150-12.5 MG tablet Take 1 tablet by mouth daily. 08/12/17   [provider]  Ketotifen Fumarate (ALLERGY EYE DROPS OP) Place 1 drop into both eyes daily as needed (allergies).    [provider]  methocarbamol (ROBAXIN) 500 MG tablet Take 1 tablet (500 mg total) by mouth every 6 (six) hours as needed for muscle spasms. 10/15/17   Prudencio Burly III, PA-C  Multiple Vitamin (MULTIVITAMIN WITH MINERALS) TABS tablet  Take 1 tablet by mouth daily.    [provider]  pravastatin (PRAVACHOL) 40 MG tablet Take 1 tablet (40 mg total) by mouth daily. Patient taking differently: Take 40 mg by mouth at bedtime.  08/09/16   Glenis Smoker, MD  predniSONE (DELTASONE) 10 MG tablet 5 tablets a day for 5 days 12/21/18   Fransico Meadow, PA-C  terconazole (TERAZOL 7) 0.4 % vaginal cream Place 1 applicator vaginally at bedtime. 08/13/18   Huel Cote, NP  TURMERIC PO Take 1 tablet by mouth 2 (two) times daily.    [provider]    Family History Family History  Problem Relation Age of Onset  . Heart disease Mother   . Hypertension Mother   . Stroke Father   . Colon cancer Neg Hx   . Stomach cancer Neg Hx     Social History Social History   Tobacco Use  . Smoking status: Former Smoker    Packs/day: 0.10    Years: 18.00    Pack years: 1.80    Types: Cigarettes    Quit date: 07/14/2015    Years since quitting: 3.4  . Smokeless tobacco: Never Used  . Tobacco comment: only smokes 1 -2 cigarettes daily.  Substance Use Topics  . Alcohol use: Not Currently  . Drug use: No     Allergies   Patient has no known allergies.   Review of Systems Review of Systems  Musculoskeletal: Positive for myalgias. Negative for joint swelling.  All other systems reviewed and are negative.    Physical Exam Updated Vital Signs BP (!) 158/83 (BP Location: Right Arm)   Pulse 68   Temp 98.5 F (36.9 C) (Oral)   Resp 17   SpO2 98%   Physical Exam Vitals signs and nursing note reviewed.  Constitutional:      Appearance: She is well-developed.  HENT:     Head: Normocephalic.  Cardiovascular:     Rate and Rhythm: Normal rate.  Pulmonary:     Effort: Pulmonary effort is normal.  Abdominal:     General: There is no distension.  Musculoskeletal:        General: No tenderness.     Comments: Tender sciatic notch area pain with movement  Neurological:     Mental Status: She is alert and  oriented to person, place, and time.  Psychiatric:        Mood and Affect: Mood normal.      ED Treatments / Results  Labs (all labs ordered are listed, but only abnormal results are  displayed) Labs Reviewed - No data to display  EKG None  Radiology No results found.  Procedures Procedures (including critical care time)  Medications Ordered in ED Medications - No data to display   Initial Impression / Assessment and Plan / ED Course  I have reviewed the triage vital signs and the nursing notes.  Pertinent labs & imaging results that were available during my care of the patient were reviewed by me and considered in my medical decision making (see chart for details).        MDM  Pt has appointment to see her MD tomorrow I advised her to discuss pain.  Pt advised she needs to see her Orthopaedist for evaluation   Final Clinical Impressions(s) / ED Diagnoses   Final diagnoses:  Right hip pain  Left hip pain    ED Discharge Orders         Ordered    predniSONE (DELTASONE) 10 MG tablet     12/21/18 1239    HYDROcodone-acetaminophen (NORCO/VICODIN) 5-325 MG tablet  Every 4 hours PRN     12/21/18 1239        An After Visit Summary was printed and given to the patient.    Fransico Meadow, [place] 12/21/18 1244    Carmin Muskrat, MD 12/21/18 [phone]

## 2018-12-21 NOTE — ED Notes (Signed)
Pt discharge instructions and prescriptions reviewed with pt. Pt verbalized understanding of both. Pt discharged.

## 2018-12-21 NOTE — ED Triage Notes (Signed)
Patient c/o left sided hip pain. Reports hip replacement about a year ago and has consistent pain but worse after a movement Friday morning. Patient ambulatory.

## 2019-02-27 ENCOUNTER — Ambulatory Visit
Admission: RE | Admit: 2019-02-27 | Discharge: 2019-02-27 | Disposition: A | Discharge status: Home / Self Care | Payer: 59 | Source: Ambulatory Visit | Attending: Women's Health | Admitting: Women's Health

## 2019-02-27 ENCOUNTER — Ambulatory Visit
Admission: RE | Admit: 2019-02-27 | Discharge: 2019-02-27 | Disposition: A | Discharge status: Home / Self Care | Payer: Managed Care, Other (non HMO) | Source: Ambulatory Visit | Attending: Women's Health | Admitting: Women's Health

## 2019-02-27 ENCOUNTER — Other Ambulatory Visit: Payer: Self-pay

## 2019-02-27 DIAGNOSIS — N6011 Diffuse cystic mastopathy of right breast: Secondary | ICD-10-CM

## 2019-03-17 ENCOUNTER — Encounter: Payer: Self-pay | Admitting: Cardiovascular Disease

## 2019-03-17 ENCOUNTER — Other Ambulatory Visit: Payer: Self-pay

## 2019-03-17 ENCOUNTER — Ambulatory Visit (INDEPENDENT_AMBULATORY_CARE_PROVIDER_SITE_OTHER): Payer: Managed Care, Other (non HMO) | Admitting: Cardiovascular Disease

## 2019-03-17 VITALS — BP 136/78 | HR 67 | Temp 98.0°F | Ht 68.0 in | Wt 215.0 lb

## 2019-03-17 DIAGNOSIS — E782 Mixed hyperlipidemia: Secondary | ICD-10-CM | POA: Diagnosis not present

## 2019-03-17 DIAGNOSIS — I10 Essential (primary) hypertension: Secondary | ICD-10-CM | POA: Diagnosis not present

## 2019-03-17 DIAGNOSIS — E7800 Pure hypercholesterolemia, unspecified: Secondary | ICD-10-CM | POA: Diagnosis not present

## 2019-03-17 NOTE — Assessment & Plan Note (Signed)
History of hyperlipidemia on pravastatin with lipid profile performed 07/08/2018 revealing a total cholesterol of 226, LDL of 138 and HDL of 73.  We will recheck a fasting lipid and liver profile.

## 2019-03-17 NOTE — Assessment & Plan Note (Signed)
History of essential hypertension blood pressure measured today at 136/78.  She is on [place].

## 2019-03-17 NOTE — Patient Instructions (Signed)
Medication Instructions:  Your physician recommends that you continue on your current medications as directed. Please refer to the Current Medication list given to you today.  If you need a refill on your cardiac medications before your next appointment, please call your pharmacy.   Lab work: Fasting Lipids and Hepatic Function in the next couple of weeks If you have labs (blood work) drawn today and your tests are completely normal, you will receive your results only by: [place] (if you have MyChart) OR A paper copy in the mail If you have any lab test that is abnormal or we need to change your treatment, we will call you to review the results.  Testing/Procedures: NONE  Follow-Up: At [facility], you and your health needs are our priority.  As part of our continuing mission to provide you with exceptional heart care, we have created designated Provider Care Teams.  These Care Teams include your primary Cardiologist (physician) and Advanced Practice Providers (APPs -  Physician Assistants and Nurse Practitioners) who all work together to provide you with the care you need, when you need it. You may see Quay Burow, MD or one of the following Advanced Practice Providers on your designated Care Team:    Kerin Ransom, PA-C  [place], [place]  Coletta Memos, [place]  Your physician wants you to follow-up in: 1 year with Dr. Gwenlyn Found. You will receive a reminder letter in the mail two months in advance. If you don't receive a letter, please call our office to schedule the follow-up appointment.

## 2019-03-17 NOTE — Progress Notes (Signed)
03/17/2019 Gabriela [name]   06/13/1960  [id number]  Primary Physician Velna Hatchet, MD Primary Cardiologist: Lorretta Harp MD FACP, [place], [place], [place]  HPI:  Gabriela [name] is a 59 y.o.  moderately overweight single [place] American female mother of one child, grandmother and 2 grandchildren who I last saw in the office  10/18/2015.Marland Kitchen Her cardiac risk factors include family history with a mother who died myocardial infarction at age 55, hypertension, hyperlipidemia and  continue tobacco abuse with 3 to 4 cigarettes a day.  She did have a heart catheterization which I performed 07/06/04 which wasn't normal. She does complain of occasional chest pain occurring every several months. These have not changed in frequency or severity.  Since I saw her back close to 4 years ago she is remained stable.  She did have a left total hip replacement by Dr. Fredonia Highland 10/13/2018 which she is still rehabilitating from.  She gets occasional palpitations and atypical chest pain which have not changed in frequency or severity.  She did have a negative GXT 09/19/2016.  Her most recent lipid profile performed 07/08/2018 revealed a total cholesterol 226, LDL 138 and HDL of 73.  She has been on pravastatin intermittently.   Current Meds  Medication Sig  . aspirin EC 81 MG tablet Take 1 tablet (81 mg total) by mouth 2 (two) times daily. For DVT prophylaxis  . Calcium Carb-Cholecalciferol (CALCIUM 600 + D PO) Take 1 tablet by mouth daily.  Marland Kitchen HYDROcodone-acetaminophen (NORCO/VICODIN) 5-325 MG tablet Take 1 tablet by mouth every 4 (four) hours as needed.  . hydrOXYzine (ATARAX/VISTARIL) 10 MG tablet Take 1 tablet (10 mg total) by mouth 3 (three) times daily as needed for itching.  . irbesartan-hydrochlorothiazide (AVALIDE) 150-12.5 MG tablet Take 1 tablet by mouth daily.  Marland Kitchen Ketotifen Fumarate (ALLERGY EYE DROPS OP) Place 1 drop into both eyes daily as needed (allergies).  . methocarbamol (ROBAXIN) 500 MG  tablet Take 1 tablet (500 mg total) by mouth every 6 (six) hours as needed for muscle spasms.  . Multiple Vitamin (MULTIVITAMIN WITH MINERALS) TABS tablet Take 1 tablet by mouth daily.  . pravastatin (PRAVACHOL) 40 MG tablet Take 1 tablet (40 mg total) by mouth daily. (Patient taking differently: Take 40 mg by mouth at bedtime. )  . predniSONE (DELTASONE) 10 MG tablet 5 tablets a day for 5 days  . terconazole (TERAZOL 7) 0.4 % vaginal cream Place 1 applicator vaginally at bedtime.  . TURMERIC PO Take 1 tablet by mouth 2 (two) times daily.     No Known Allergies  Social History   Socioeconomic History  . Marital status: Single    Spouse name: Not on file  . Number of children: Not on file  . Years of education: Not on file  . Highest education level: Not on file  Occupational History  . Occupation: Unemployed     Employer: [employer]  Tobacco Use  . Smoking status: Former Smoker    Packs/day: 0.10    Years: 18.00    Pack years: 1.80    Types: Cigarettes    Quit date: 07/14/2015    Years since quitting: 3.6  . Smokeless tobacco: Never Used  . Tobacco comment: only smokes 1 -2 cigarettes daily.  Substance and Sexual Activity  . Alcohol use: Not Currently  . Drug use: No  . Sexual activity: Yes    Birth control/protection: Condom  Other Topics Concern  . Not on file  Social History  Narrative  . Not on file   Social Determinants of Health   Financial Resource Strain:   . Difficulty of Paying Living Expenses: Not on file  Food Insecurity:   . Worried About Charity fundraiser in the Last Year: Not on file  . Ran Out of Food in the Last Year: Not on file  Transportation Needs:   . Lack of Transportation (Medical): Not on file  . Lack of Transportation (Non-Medical): Not on file  Physical Activity:   . Days of Exercise per Week: Not on file  . Minutes of Exercise per Session: Not on file  Stress:   . Feeling of Stress : Not on file  Social Connections:   .  Frequency of Communication with Friends and Family: Not on file  . Frequency of Social Gatherings with Friends and Family: Not on file  . Attends Religious Services: Not on file  . Active Member of Clubs or Organizations: Not on file  . Attends Archivist Meetings: Not on file  . Marital Status: Not on file  Intimate Partner Violence:   . Fear of Current or Ex-Partner: Not on file  . Emotionally Abused: Not on file  . Physically Abused: Not on file  . Sexually Abused: Not on file     Review of Systems: General: negative for chills, fever, night sweats or weight changes.  Cardiovascular: negative for chest pain, dyspnea on exertion, edema, orthopnea, palpitations, paroxysmal nocturnal dyspnea or shortness of breath Dermatological: negative for rash Respiratory: negative for cough or wheezing Urologic: negative for hematuria Abdominal: negative for nausea, vomiting, diarrhea, bright red blood per rectum, melena, or hematemesis Neurologic: negative for visual changes, syncope, or dizziness All other systems reviewed and are otherwise negative except as noted above.    Blood pressure 136/78, pulse 67, temperature 98 F (36.7 C), height 5\' 8"  (1.727 m), weight 215 lb (97.5 kg).  General appearance: alert and no distress Neck: no adenopathy, no carotid bruit, no JVD, supple, symmetrical, trachea midline and thyroid not enlarged, symmetric, no tenderness/mass/nodules Lungs: clear to auscultation bilaterally Heart: regular rate and rhythm, S1, S2 normal, no murmur, click, rub or gallop Extremities: extremities normal, atraumatic, no cyanosis or edema Pulses: 2+ and symmetric Skin: Skin color, texture, turgor normal. No rashes or lesions Neurologic: Alert and oriented X 3, normal strength and tone. Normal symmetric reflexes. Normal coordination and gait  EKG sinus rhythm at 67 without ST or T wave changes.  I personally reviewed this EKG.  ASSESSMENT AND PLAN:   HLD  (hyperlipidemia) History of hyperlipidemia on pravastatin with lipid profile performed 07/08/2018 revealing a total cholesterol of 226, LDL of 138 and HDL of 73.  We will recheck a fasting lipid and liver profile.  Hypertension History of essential hypertension blood pressure measured today at 136/78.  She is on Avalide.      Lorretta Harp MD FACP,FACC,FAHA, [facility] 03/17/2019 4:04 PM

## 2019-03-19 ENCOUNTER — Ambulatory Visit: Payer: Managed Care, Other (non HMO) | Admitting: Physical Therapy

## 2019-06-23 IMAGING — MG DIGITAL SCREENING BILATERAL MAMMOGRAM WITH TOMO AND CAD
8 series · 9 of 24 positions shown · non-contrast
Comparison: Previous exam(s).

CLINICAL DATA: Screening.

EXAM:
DIGITAL SCREENING BILATERAL MAMMOGRAM WITH TOMO AND CAD

[L CC synth-2D]
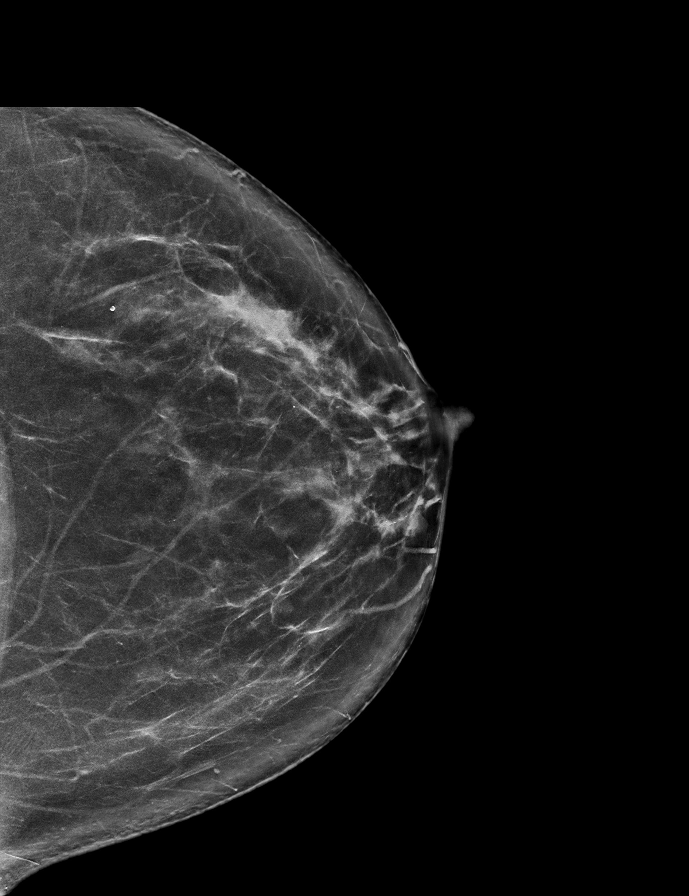

[R CC synth-2D]
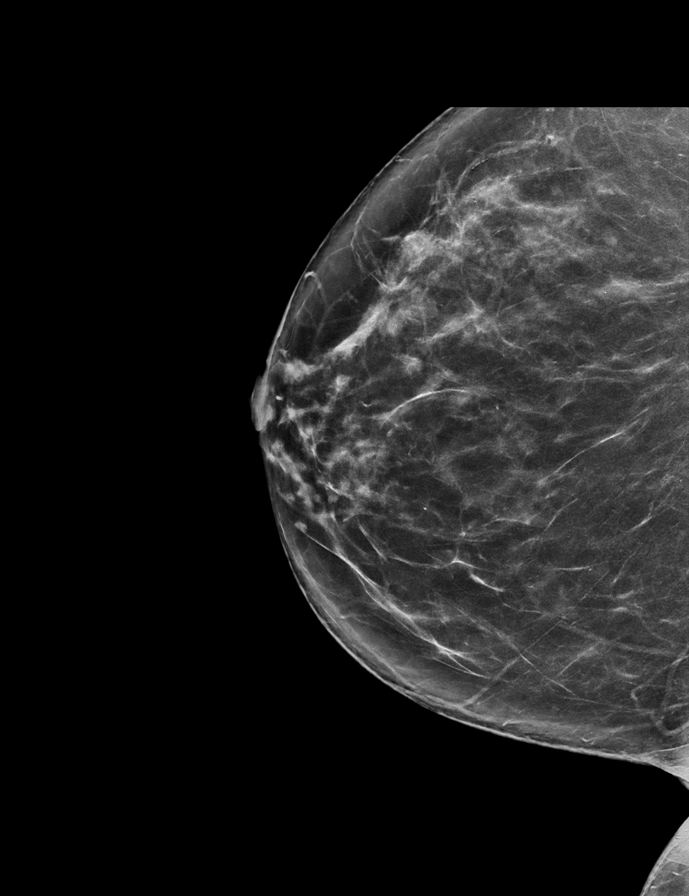

[L MLO synth-2D]
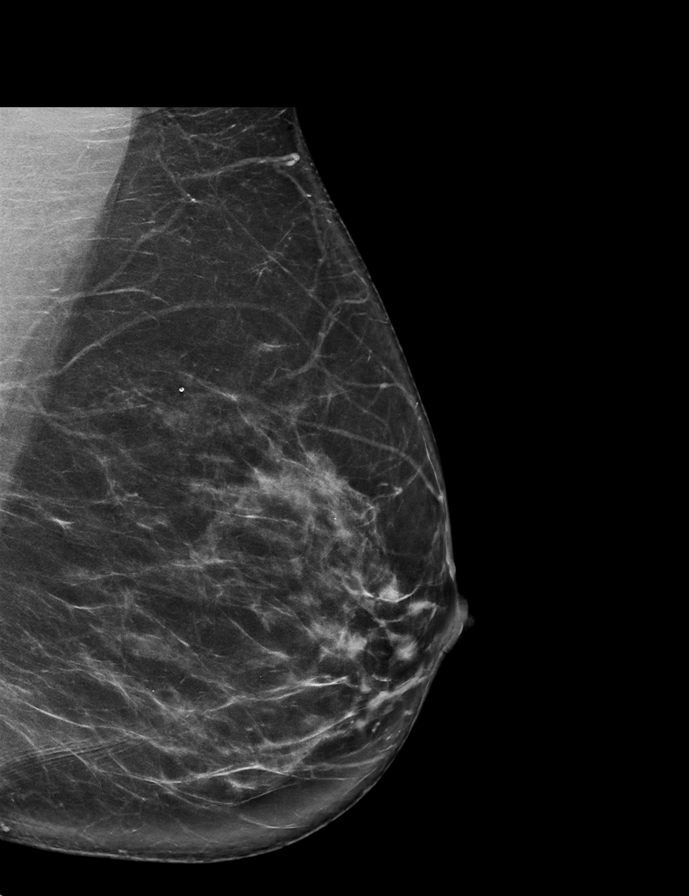

[R MLO synth-2D]
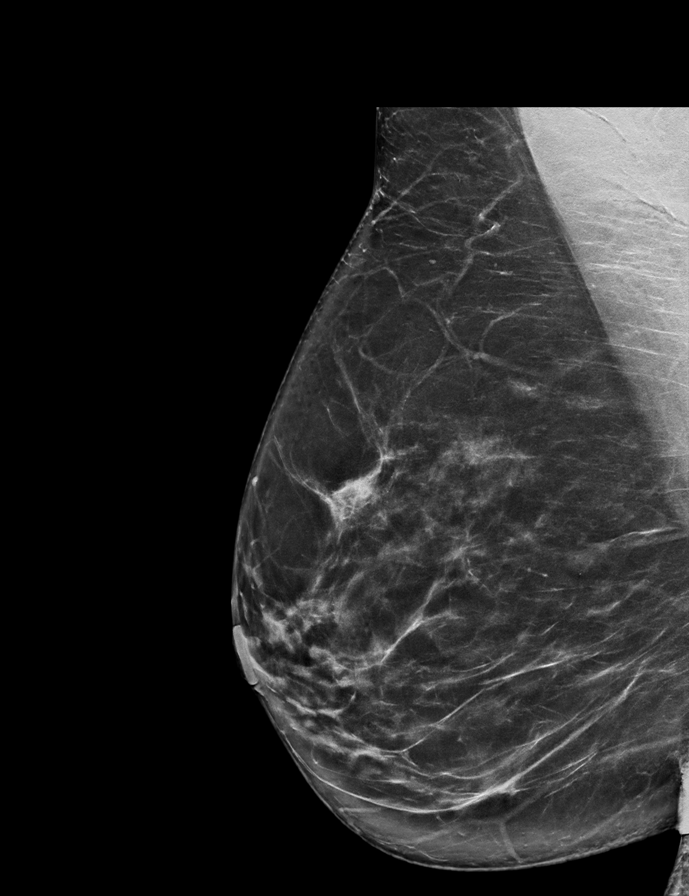

[L CC tomo · 2 of 77 frames shown]
[frame 25/77]
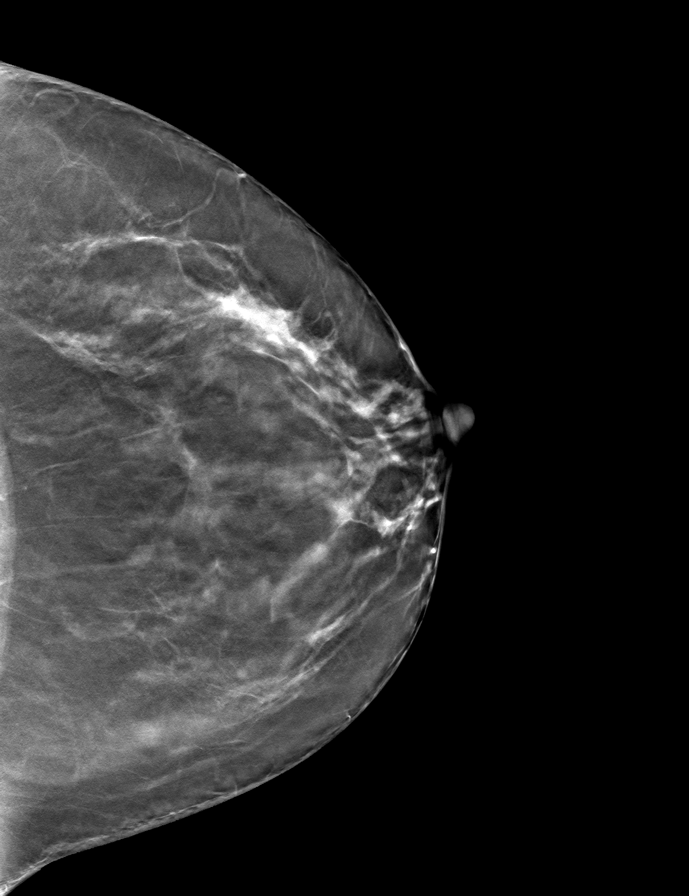
[frame 39/77]
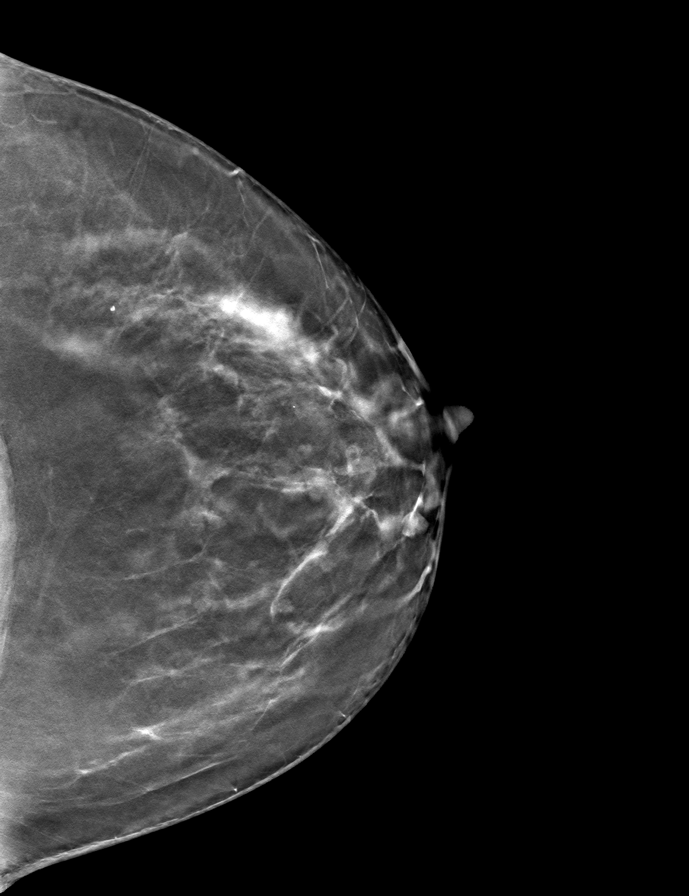

[L MLO tomo · tomo slice 41/80.0]
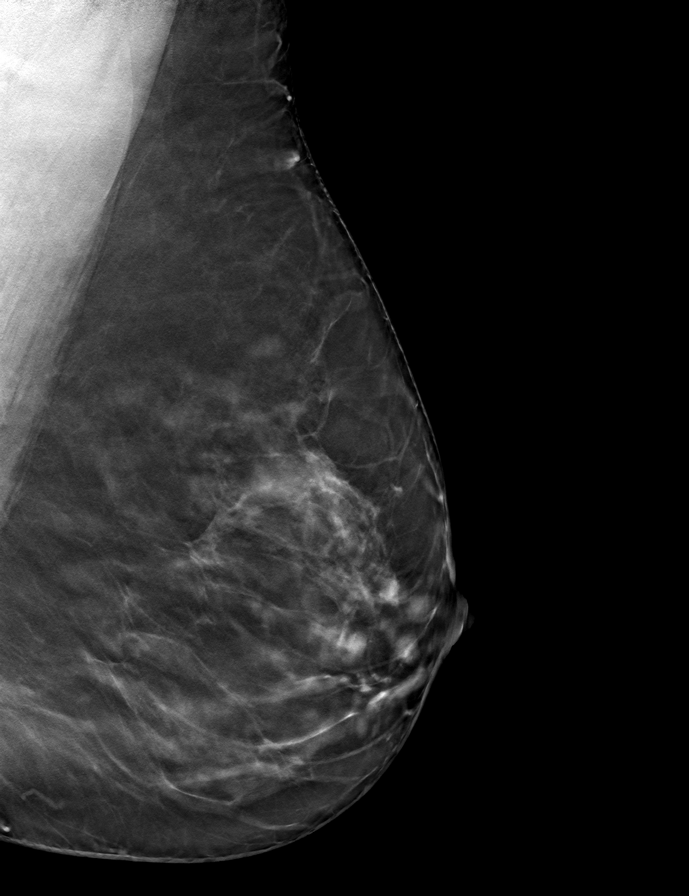

[R MLO tomo · tomo slice 43/85.0]
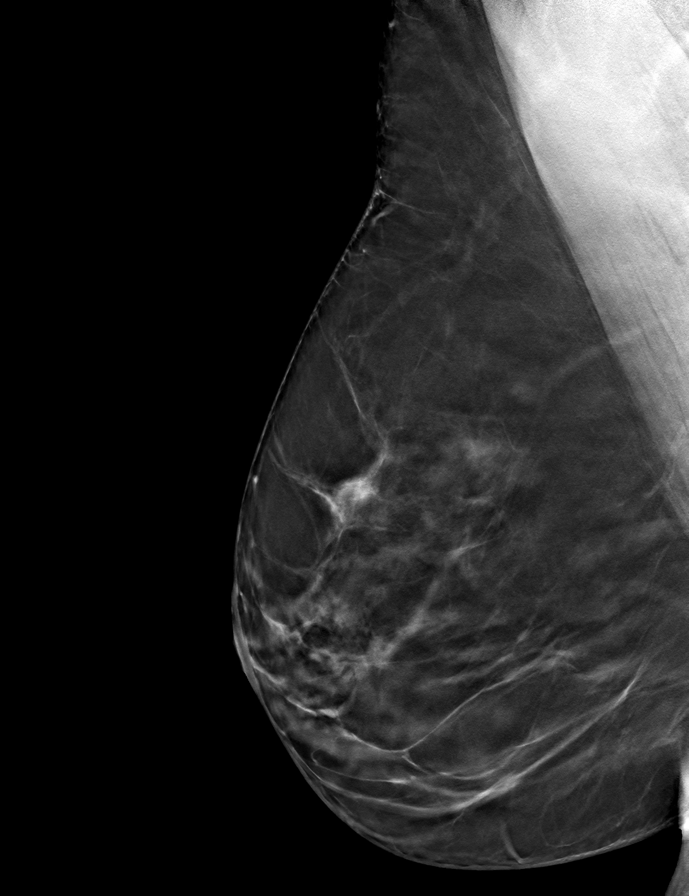

[R CC tomo · tomo slice 39/76.0]
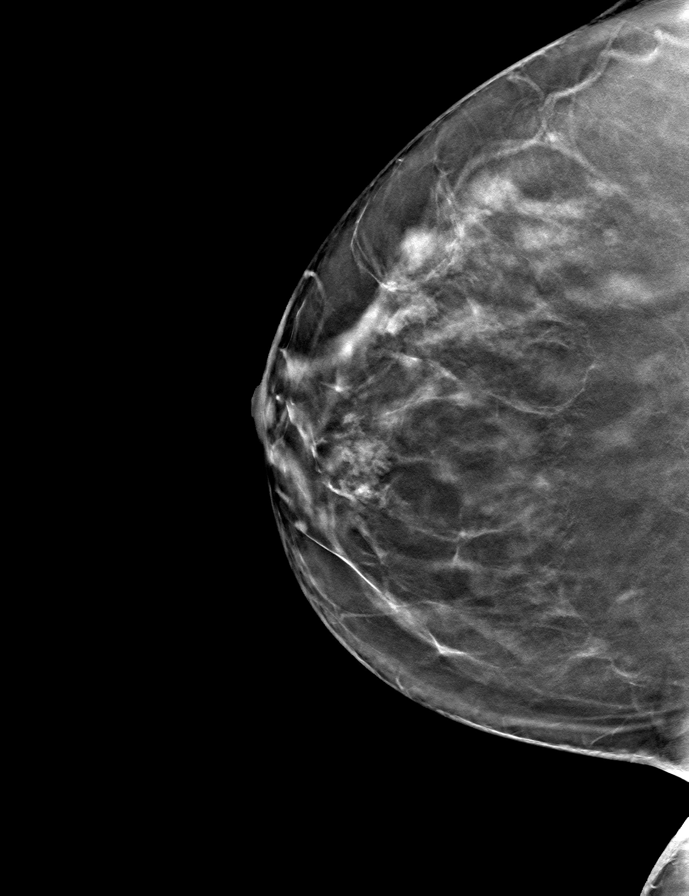

[9 of 24 positions shown; findings below may reference images not displayed]

ACR Breast Density Category b: There are scattered areas of
fibroglandular density.
FINDINGS: In the right breast, a possible mass warrants further evaluation. In
the left breast, no findings suspicious for malignancy. Images were
processed with CAD.
IMPRESSION: Further evaluation is suggested for possible mass in the right
breast.

RECOMMENDATION:
Diagnostic mammogram and possibly ultrasound of the right breast.
(Code:T1-A-550)

The patient will be contacted regarding the findings, and additional
imaging will be scheduled.

BI-RADS CATEGORY  0: Incomplete. Need additional imaging evaluation
and/or prior mammograms for comparison.

## 2019-09-16 ENCOUNTER — Encounter: Payer: No Typology Code available for payment source | Admitting: Nurse Practitioner

## 2019-10-20 ENCOUNTER — Encounter: Payer: Self-pay | Admitting: Nurse Practitioner

## 2019-10-20 ENCOUNTER — Ambulatory Visit (INDEPENDENT_AMBULATORY_CARE_PROVIDER_SITE_OTHER): Payer: No Typology Code available for payment source | Admitting: Nurse Practitioner

## 2019-10-20 ENCOUNTER — Other Ambulatory Visit: Payer: Self-pay

## 2019-10-20 VITALS — BP 128/80 | Ht 68.0 in | Wt 204.0 lb

## 2019-10-20 DIAGNOSIS — Z01419 Encounter for gynecological examination (general) (routine) without abnormal findings: Secondary | ICD-10-CM

## 2019-10-20 DIAGNOSIS — N644 Mastodynia: Secondary | ICD-10-CM

## 2019-10-20 DIAGNOSIS — N898 Other specified noninflammatory disorders of vagina: Secondary | ICD-10-CM

## 2019-10-20 DIAGNOSIS — R232 Flushing: Secondary | ICD-10-CM | POA: Diagnosis not present

## 2019-10-20 DIAGNOSIS — Z90710 Acquired absence of both cervix and uterus: Secondary | ICD-10-CM | POA: Diagnosis not present

## 2019-10-20 NOTE — Patient Instructions (Addendum)
Ginseng, Black Cohosh, [place], ADD Vitamin E  Health Maintenance for Postmenopausal Women Menopause is a normal process in which your ability to get pregnant comes to an end. This process happens slowly over many months or years, usually between the ages of 25 and 59. Menopause is complete when you have missed your menstrual periods for 12 months. It is important to talk with your health care provider about some of the most common conditions that affect women after menopause (postmenopausal women). These include heart disease, cancer, and bone loss (osteoporosis). Adopting a healthy lifestyle and getting preventive care can help to promote your health and wellness. The actions you take can also lower your chances of developing some of these common conditions. What should I know about menopause? During menopause, you may get a number of symptoms, such as:  Hot flashes. These can be moderate or severe.  Night sweats.  Decrease in sex drive.  Mood swings.  Headaches.  Tiredness.  Irritability.  Memory problems.  Insomnia. Choosing to treat or not to treat these symptoms is a decision that you make with your health care provider. Do I need hormone replacement therapy?  Hormone replacement therapy is effective in treating symptoms that are caused by menopause, such as hot flashes and night sweats.  Hormone replacement carries certain risks, especially as you become older. If you are thinking about using estrogen or estrogen with progestin, discuss the benefits and risks with your health care provider. What is my risk for heart disease and stroke? The risk of heart disease, heart attack, and stroke increases as you age. One of the causes may be a change in the body's hormones during menopause. This can affect how your body uses dietary fats, triglycerides, and cholesterol. Heart attack and stroke are medical emergencies. There are many things that you can do to help prevent heart disease  and stroke. Watch your blood pressure  High blood pressure causes heart disease and increases the risk of stroke. This is more likely to develop in people who have high blood pressure readings, are of African descent, or are overweight.  Have your blood pressure checked: ? Every 3-5 years if you are 41-44 years of age. ? Every year if you are 2 years old or older. Eat a healthy diet   Eat a diet that includes plenty of vegetables, fruits, low-fat dairy products, and lean protein.  Do not eat a lot of foods that are high in solid fats, added sugars, or sodium. Get regular exercise Get regular exercise. This is one of the most important things you can do for your health. Most adults should:  Try to exercise for at least 150 minutes each week. The exercise should increase your heart rate and make you sweat (moderate-intensity exercise).  Try to do strengthening exercises at least twice each week. Do these in addition to the moderate-intensity exercise.  Spend less time sitting. Even light physical activity can be beneficial. Other tips  Work with your health care provider to achieve or maintain a healthy weight.  Do not use any products that contain nicotine or tobacco, such as cigarettes, e-cigarettes, and chewing tobacco. If you need help quitting, ask your health care provider.  Know your numbers. Ask your health care provider to check your cholesterol and your blood sugar (glucose). Continue to have your blood tested as directed by your health care provider. Do I need screening for cancer? Depending on your health history and family history, you may need to have cancer  screening at different stages of your life. This may include screening for:  Breast cancer.  Cervical cancer.  Lung cancer.  Colorectal cancer. What is my risk for osteoporosis? After menopause, you may be at increased risk for osteoporosis. Osteoporosis is a condition in which bone destruction happens more  quickly than new bone creation. To help prevent osteoporosis or the bone fractures that can happen because of osteoporosis, you may take the following actions:  If you are 43-36 years old, get at least 1,000 mg of calcium and at least 600 mg of vitamin D per day.  If you are older than age 5 but younger than age 47, get at least 1,200 mg of calcium and at least 600 mg of vitamin D per day.  If you are older than age 32, get at least 1,200 mg of calcium and at least 800 mg of vitamin D per day. Smoking and drinking excessive alcohol increase the risk of osteoporosis. Eat foods that are rich in calcium and vitamin D, and do weight-bearing exercises several times each week as directed by your health care provider. How does menopause affect my mental health? Depression may occur at any age, but it is more common as you become older. Common symptoms of depression include:  Low or sad mood.  Changes in sleep patterns.  Changes in appetite or eating patterns.  Feeling an overall lack of motivation or enjoyment of activities that you previously enjoyed.  Frequent crying spells. Talk with your health care provider if you think that you are experiencing depression. General instructions See your health care provider for regular wellness exams and vaccines. This may include:  Scheduling regular health, dental, and eye exams.  Getting and maintaining your vaccines. These include: ? Influenza vaccine. Get this vaccine each year before the flu season begins. ? Pneumonia vaccine. ? Shingles vaccine. ? Tetanus, diphtheria, and pertussis (Tdap) booster vaccine. Your health care provider may also recommend other immunizations. Tell your health care provider if you have ever been abused or do not feel safe at home. Summary  Menopause is a normal process in which your ability to get pregnant comes to an end.  This condition causes hot flashes, night sweats, decreased interest in sex, mood swings,  headaches, or lack of sleep.  Treatment for this condition may include hormone replacement therapy.  Take actions to keep yourself healthy, including exercising regularly, eating a healthy diet, watching your weight, and checking your blood pressure and blood sugar levels.  Get screened for cancer and depression. Make sure that you are up to date with all your vaccines. This information is not intended to replace advice given to you by your health care provider. Make sure you discuss any questions you have with your health care provider. Document Revised: 01/01/2018 Document Reviewed: 01/01/2018 Elsevier Patient Education  2020 [employer].

## 2019-10-20 NOTE — Progress Notes (Signed)
   Gabriela [name] 1960/02/12 [id number]   History:  59 y.o. [id number] presents for annual exam. 2003 TVH for fibroids, no HRT. Normal pap and mammogram history. Stable right breast cyst for 2 years. Today she complains of left breast burning in left outer region. Denies nipple discharge, swelling, redness, or lump. Complains of vaginal dryness and hot flashes. Not currently sexually active.   Gynecologic History No LMP recorded. Patient has had a hysterectomy.   Last Pap: 2011. Results were: normal Last mammogram: 02/27/2019. Results were: stable benign right breast mass Last colonoscopy: 04/26/2016. Results were: polyp Last Dexa: 10/21/2018. Results were: normal, 5 year follow up.   Past medical history, past surgical history, family history and social history were all reviewed and documented in the EPIC chart.  ROS:  A ROS was performed and pertinent positives and negatives are included.  Exam:  Vitals:   10/20/19 1609  BP: 128/80  Weight: 204 lb (92.5 kg)  Height: 5\' 8"  (1.727 m)   Body mass index is 31.02 kg/m.  General appearance:  Normal Thyroid:  Symmetrical, normal in size, without palpable masses or nodularity. Respiratory  Auscultation:  Clear without wheezing or rhonchi Cardiovascular  Auscultation:  Regular rate, without rubs, murmurs or gallops  Edema/varicosities:  Not grossly evident Abdominal  Soft,nontender, without masses, guarding or rebound.  Liver/spleen:  No organomegaly noted  Hernia:  None appreciated  Skin  Inspection:  Grossly normal   Breasts: Examined lying and sitting.   Right: Without masses, retractions, discharge or axillary adenopathy.   Left: Without masses, retractions, discharge or axillary adenopathy. Gentitourinary   Inguinal/mons:  Normal without inguinal adenopathy  External genitalia:  Normal  BUS/Urethra/Skene's glands:  Normal  Vagina:  Normal  Cervix:  Absent  Uterus:  Absent  Adnexa/parametria:     Rt: Without masses or  tenderness.   Lt: Without masses or tenderness.  Anus and perineum: Normal  Digital rectal exam: Normal sphincter tone without palpated masses or tenderness  Assessment/Plan:  59 y.o. [id number] for annual exam.   Well female exam with routine gynecological exam - . Education provided on SBEs, importance of preventative screenings, current guidelines, high calcium diet, regular exercise, and multivitamin daily.  Labs done with primary care.   Screening for cervical cancer - Normal pap history. Last pap in 2011. Will repeat today at 10-year interval. Discussed the option to stop screening if normal pap today per guidelines. We will reassess on an annual basis. Plan: Pap IG w/ reflex to HPV when ASC-U  Vaginal dryness - OTC lubricants as needed for intercourse and Replens 2-3 times per week. Discussed vaginal estrogen cream/tablets and low risks of systemic absorption. She would like to try OTC options.   Hot flashes - Discussed treatment options to include OTC supplements such as Ginseng, Black Cohosh, and Estroven with Vitamin E, SSRIs such as Effexor/Prozac, and hormone replacement therapy. We talked about the risks and benefits of each. She admits to not being good at taking medication so we agree she should avoid SSRI and hormones and she will OTC supplements.   History of total vaginal hysterectomy (TVH) - 2003 for fibroids. No HRT.   Pain of left breast - this is new. History of stable right breast cyst. Last mammogram 02/2018. We will send referral for ultrasound of left breast for further evaluation.   Follow up in 1 year for annual.     Tamela Gammon [facility], 4:21 PM 10/20/2019

## 2019-10-21 ENCOUNTER — Telehealth: Payer: Self-pay | Admitting: *Deleted

## 2019-10-21 DIAGNOSIS — N644 Mastodynia: Secondary | ICD-10-CM

## 2019-10-21 LAB — PAP IG W/ RFLX HPV ASCU

## 2019-10-21 NOTE — Telephone Encounter (Signed)
Patient scheduled at breast center for diag. Left breast and left breast ultrasound on 11/04/19 @ 7:20am, left message for patient to call.

## 2019-10-21 NOTE — Telephone Encounter (Signed)
-----   Message from Tamela Gammon, NP sent at 10/20/2019  4:46 PM EDT ----- Please send referral for left breast ultrasound for burning in left breast.

## 2019-10-27 NOTE — Telephone Encounter (Signed)
Left message for patient to call.

## 2019-10-27 NOTE — Telephone Encounter (Signed)
Patient informed with time and date.  

## 2019-11-04 ENCOUNTER — Other Ambulatory Visit: Payer: Managed Care, Other (non HMO)

## 2019-11-20 ENCOUNTER — Ambulatory Visit: Payer: Managed Care, Other (non HMO)

## 2019-11-20 ENCOUNTER — Other Ambulatory Visit: Payer: Self-pay

## 2019-11-20 ENCOUNTER — Ambulatory Visit
Admission: RE | Admit: 2019-11-20 | Discharge: 2019-11-20 | Disposition: A | Discharge status: Home / Self Care | Payer: Managed Care, Other (non HMO) | Source: Ambulatory Visit | Attending: Nurse Practitioner | Admitting: Nurse Practitioner

## 2019-11-20 DIAGNOSIS — N644 Mastodynia: Secondary | ICD-10-CM

## 2019-12-26 ENCOUNTER — Other Ambulatory Visit: Payer: Self-pay

## 2019-12-26 ENCOUNTER — Emergency Department (HOSPITAL_COMMUNITY): Payer: No Typology Code available for payment source

## 2019-12-26 ENCOUNTER — Encounter (HOSPITAL_COMMUNITY): Payer: Self-pay

## 2019-12-26 ENCOUNTER — Emergency Department (HOSPITAL_COMMUNITY)
Admission: EM | Admit: 2019-12-26 | Discharge: 2019-12-26 | Disposition: A | Discharge status: Home / Self Care | Payer: No Typology Code available for payment source | Attending: Emergency Medicine | Admitting: Emergency Medicine

## 2019-12-26 DIAGNOSIS — R000 Tachycardia, unspecified: Secondary | ICD-10-CM | POA: Diagnosis present

## 2019-12-26 DIAGNOSIS — I10 Essential (primary) hypertension: Secondary | ICD-10-CM | POA: Diagnosis not present

## 2019-12-26 DIAGNOSIS — Z96642 Presence of left artificial hip joint: Secondary | ICD-10-CM | POA: Diagnosis not present

## 2019-12-26 DIAGNOSIS — Z87891 Personal history of nicotine dependence: Secondary | ICD-10-CM | POA: Diagnosis not present

## 2019-12-26 DIAGNOSIS — R42 Dizziness and giddiness: Secondary | ICD-10-CM | POA: Insufficient documentation

## 2019-12-26 DIAGNOSIS — Z79899 Other long term (current) drug therapy: Secondary | ICD-10-CM | POA: Insufficient documentation

## 2019-12-26 DIAGNOSIS — Z7982 Long term (current) use of aspirin: Secondary | ICD-10-CM | POA: Insufficient documentation

## 2019-12-26 LAB — CBC WITH DIFFERENTIAL/PLATELET
Abs Immature Granulocytes: 0.01 10*3/uL (ref 0.00–0.07)
Basophils Absolute: 0 10*3/uL (ref 0.0–0.1)
Basophils Relative: 1 %
Eosinophils Absolute: 0.1 10*3/uL (ref 0.0–0.5)
Eosinophils Relative: 3 %
HCT: 41.1 % (ref 36.0–46.0)
Hemoglobin: 13.2 g/dL (ref 12.0–15.0)
Immature Granulocytes: 0 %
Lymphocytes Relative: 39 %
Lymphs Abs: 1.8 10*3/uL (ref 0.7–4.0)
MCH: 27.8 pg (ref 26.0–34.0)
MCHC: 32.1 g/dL (ref 30.0–36.0)
MCV: 86.5 fL (ref 80.0–100.0)
Monocytes Absolute: 0.8 10*3/uL (ref 0.1–1.0)
Monocytes Relative: 17 %
Neutro Abs: 1.8 10*3/uL (ref 1.7–7.7)
Neutrophils Relative %: 40 %
Platelets: 296 10*3/uL (ref 150–400)
RBC: 4.75 MIL/uL (ref 3.87–5.11)
RDW: 13.2 % (ref 11.5–15.5)
WBC: 4.5 10*3/uL (ref 4.0–10.5)
nRBC: 0 % (ref 0.0–0.2)

## 2019-12-26 LAB — BASIC METABOLIC PANEL
Anion gap: 13 (ref 5–15)
BUN: 14 mg/dL (ref 6–20)
CO2: 25 mmol/L (ref 22–32)
Calcium: 9.9 mg/dL (ref 8.9–10.3)
Chloride: 106 mmol/L (ref 98–111)
Creatinine, Ser: 0.84 mg/dL (ref 0.44–1.00)
GFR, Estimated: >60 mL/min (ref >60)
Glucose, Bld: 96 mg/dL (ref 70–99)
Potassium: 3.5 mmol/L (ref 3.5–5.1)
Sodium: 144 mmol/L (ref 135–145)

## 2019-12-26 LAB — TROPONIN I (HIGH SENSITIVITY)
Troponin I (High Sensitivity): 5 ng/L (ref <18)
Troponin I (High Sensitivity): 6 ng/L (ref <18)

## 2019-12-26 LAB — TSH: TSH: 3.41 u[IU]/mL (ref 0.350–4.500)

## 2019-12-26 MED ORDER — SODIUM CHLORIDE 0.9 % IV SOLN: INTRAVENOUS | Status: DC

## 2019-12-26 MED ORDER — METOPROLOL TARTRATE 5 MG/5ML IV SOLN
2.5000 mg | Freq: Once | INTRAVENOUS | Status: AC
  Administered 2019-12-26: 2.5 mg via INTRAVENOUS
  Filled 2019-12-26: qty 5

## 2019-12-26 NOTE — ED Triage Notes (Addendum)
Pt from home with ems c.o feeling lightheaded and dizzy. Pt was initially in sinus tach and progressed to SVT rate ranging from 91-216bpm. Pt denies any hx of same, hx of HTN. Pt denies chest pain but is having pressure. Pt arrives to ED alert and oriented, HR ranging from 93-140bpm at this time. Pt reports she got her second COVID vaccine on tuesday

## 2019-12-26 NOTE — Discharge Instructions (Addendum)
Call your cardiologist on Monday morning to schedule a follow-up visit for your fast heart rate here.  Return here at once if you should develop fast heartbeat, chest pressure, or any other problems

## 2019-12-26 NOTE — ED Provider Notes (Signed)
[facility] EMERGENCY DEPARTMENT Provider Note   CSN: [id number] Arrival date & time: 12/26/19  1007     History Chief Complaint  Patient presents with  . Tachycardia  . Dizziness    Gabriela [name] is a 59 y.o. female.  59 year old female presents complaining of being dizzy and lightheaded since yesterday but progressed this morning.  Denies any chest pain but has had some chest pressure.  No shortness of breath.  Dizziness is described as fleeting.  No recent stimulant use.  Denies any changes to her medications.  Called EMS and was found to be in sinus tach which then progressed to SVT.  Patient symptoms have been waxing and waning.  EMS called and patient transported here.        Past Medical History:  Diagnosis Date  . Anemia   . Arthritis    OA  . Benign colon polyp    adenomatous  . Chest pain    a. normal cors by cath in 2006 b. calcium score of 0 by Coronary CT in 11/2011  . Dysmenorrhea   . Fibroid   . Hip pain    left hip  . Hypertension   . Irregular heart beat   . Smoker    ONE PACK PER WEEK    Patient Active Problem List   Diagnosis Date Noted  . Primary osteoarthritis of hip 10/15/2017  . Primary osteoarthritis of left hip 10/02/2017  . Primary osteoarthritis of right knee 10/02/2017  . Fatigue 05/05/2015  . Constipation 05/05/2015  . Tobacco abuse 10/12/2014  . Insomnia 09/24/2014  . Mastodynia, female 07/12/2014  . Ankle edema 06/04/2014  . Abdominal pain 02/02/2014  . Chest pain 09/23/2013  . RUQ pain 08/26/2013  . Sprain of ankle, unspecified site 06/08/2013  . Greater trochanteric bursitis of left hip 12/07/2011  . Hot flashes, menopausal 08/09/2011  . Dyspepsia 04/27/2011  . Stress 04/17/2011  . Ex-light cigarette smoker (1-9 per day)   . Hypertension   . Hx of adenomatous colonic polyps   . Overweight(278.02) 02/28/2009  . HLD (hyperlipidemia) 03/24/2008  . DEPRESSION, MAJOR, MILD 11/13/2007    Past  Surgical History:  Procedure Laterality Date  . ABDOMINAL HYSTERECTOMY  06/2001   Toney Rakes, M.D. PARTIAL  . CARDIAC CATHETERIZATION  2003   Dr Gwenlyn Found  . CARDIOLITE MYOCARDIAL PERFUSION  06/07/03   NO PERFUSION ABNORMALITY. EF% 68%.  Marland Kitchen GYNECOLOGIC CRYOSURGERY  1993   DYSPLASIA  . LOWER EXT ARTERIAL  07/28/08   NORMAL  . TOTAL HIP ARTHROPLASTY Left 10/15/2017   Procedure: LEFT TOTAL HIP ARTHROPLASTY ANTERIOR APPROACH;  Surgeon: [name], MD;  Location: [facility];  Service: Orthopedics;  Laterality: Left;  . TRANSTHORACIC ECHOCARDIOGRAM  07/03/04   TRACE MR,TRACE TR,TRACE PVR  . UMBILICAL HERNIA REPAIR     AS A CHILD     OB History    Gravida  3   Para  1   Term  0   Preterm  0   AB  2   Living  1     SAB  0   TAB  2   Ectopic  0   Multiple  0   Live Births              Family History  Problem Relation Age of Onset  . Heart disease Mother   . Hypertension Mother   . Stroke Father   . Colon cancer Neg Hx   . Stomach cancer Neg Hx  Social History   Tobacco Use  . Smoking status: Former Smoker    Packs/day: 0.10    Years: 18.00    Pack years: 1.80    Types: Cigarettes    Quit date: 07/14/2015    Years since quitting: 4.4  . Smokeless tobacco: Never Used  . Tobacco comment: only smokes 1 -2 cigarettes daily.  Vaping Use  . Vaping Use: Never used  Substance Use Topics  . Alcohol use: Yes  . Drug use: No    Home Medications Prior to Admission medications   Medication Sig Start Date End Date Taking? Authorizing Provider  aspirin EC 81 MG tablet Take 1 tablet (81 mg total) by mouth 2 (two) times daily. For DVT prophylaxis 10/15/17   Prudencio Burly III, PA-C  Calcium Carb-Cholecalciferol (CALCIUM 600 + D PO) Take 1 tablet by mouth daily.    [provider]  HYDROcodone-acetaminophen (NORCO/VICODIN) 5-325 MG tablet Take 1 tablet by mouth every 4 (four) hours as needed. 12/21/18   Fransico Meadow, PA-C  hydrOXYzine  (ATARAX/VISTARIL) 10 MG tablet Take 1 tablet (10 mg total) by mouth 3 (three) times daily as needed for itching. 04/18/16   Haney, Amedeo Plenty, MD  irbesartan-hydrochlorothiazide (AVALIDE) 150-12.5 MG tablet Take 1 tablet by mouth daily. 08/12/17   [provider]  Ketotifen Fumarate (ALLERGY EYE DROPS OP) Place 1 drop into both eyes daily as needed (allergies).    [provider]  methocarbamol (ROBAXIN) 500 MG tablet Take 1 tablet (500 mg total) by mouth every 6 (six) hours as needed for muscle spasms. 10/15/17   Prudencio Burly III, PA-C  Multiple Vitamin (MULTIVITAMIN WITH MINERALS) TABS tablet Take 1 tablet by mouth daily.    [provider]  pravastatin (PRAVACHOL) 40 MG tablet Take 1 tablet (40 mg total) by mouth daily. Patient taking differently: Take 40 mg by mouth at bedtime.  08/09/16   Glenis Smoker, MD  terconazole (TERAZOL 7) 0.4 % vaginal cream Place 1 applicator vaginally at bedtime. 08/13/18   Huel Cote, NP  TURMERIC PO Take 1 tablet by mouth 2 (two) times daily.    [provider]    Allergies    Patient has no known allergies.  Review of Systems   Review of Systems  All other systems reviewed and are negative.   Physical Exam Updated Vital Signs BP 140/87   Pulse 72   Temp 97.7 F (36.5 C) (Oral)   Resp 18   Ht 1.702 m (5\' 7" )   Wt 92 kg   SpO2 100%   BMI 31.77 kg/m   Physical Exam Vitals and nursing note reviewed.  Constitutional:      General: She is not in acute distress.    Appearance: Normal appearance. She is well-developed. She is not toxic-appearing.  HENT:     Head: Normocephalic and atraumatic.  Eyes:     General: Lids are normal.     Conjunctiva/sclera: Conjunctivae normal.     Pupils: Pupils are equal, round, and reactive to light.  Neck:     Thyroid: No thyroid mass.     Trachea: No tracheal deviation.  Cardiovascular:     Rate and Rhythm: Tachycardia present. Rhythm irregular.     Heart  sounds: Normal heart sounds. No murmur heard.  No gallop.   Pulmonary:     Effort: Pulmonary effort is normal. No respiratory distress.     Breath sounds: Normal breath sounds. No stridor. No decreased breath sounds,  wheezing, rhonchi or rales.  Abdominal:     General: Bowel sounds are normal. There is no distension.     Palpations: Abdomen is soft.     Tenderness: There is no abdominal tenderness. There is no rebound.  Musculoskeletal:        General: No tenderness. Normal range of motion.     Cervical back: Normal range of motion and neck supple.  Skin:    General: Skin is warm and dry.     Findings: No abrasion or rash.  Neurological:     Mental Status: She is alert and oriented to person, place, and time.     GCS: GCS eye subscore is 4. GCS verbal subscore is 5. GCS motor subscore is 6.     Cranial Nerves: No cranial nerve deficit.     Sensory: No sensory deficit.  Psychiatric:        Speech: Speech normal.        Behavior: Behavior normal.     ED Results / Procedures / Treatments   Labs (all labs ordered are listed, but only abnormal results are displayed) Labs Reviewed  CBC WITH DIFFERENTIAL/PLATELET  BASIC METABOLIC PANEL    EKG None  Radiology No results found.  Procedures Procedures (including critical care time)  Medications Ordered in ED Medications  0.9 %  sodium chloride infusion (has no administration in time range)  metoprolol tartrate (LOPRESSOR) injection 2.5 mg (has no administration in time range)    ED Course  I have reviewed the triage vital signs and the nursing notes.  Pertinent labs & imaging results that were available during my care of the patient were reviewed by me and considered in my medical decision making (see chart for details).    MDM Rules/Calculators/A&P                          Patient given Lopressor 2.5 mg IV push for tachycardia which appeared to be SVT.  Had good response to that and was monitored for several hours  without recurrence.  Labs here are reassuring including her delta troponin.  Chest x-ray and TSH also normal.  Patient to be discharged and was instructed to follow with a cardiologist Final Clinical Impression(s) / ED Diagnoses Final diagnoses:  None    Rx / DC Orders ED Discharge Orders    None       Lacretia Leigh, MD 12/26/19 1216

## 2019-12-30 ENCOUNTER — Encounter: Payer: Self-pay | Admitting: Cardiovascular Disease

## 2019-12-30 ENCOUNTER — Ambulatory Visit (INDEPENDENT_AMBULATORY_CARE_PROVIDER_SITE_OTHER): Payer: No Typology Code available for payment source | Admitting: Cardiovascular Disease

## 2019-12-30 ENCOUNTER — Telehealth: Payer: Self-pay | Admitting: Radiology

## 2019-12-30 ENCOUNTER — Other Ambulatory Visit: Payer: Self-pay

## 2019-12-30 VITALS — BP 132/76 | HR 76 | Ht 67.0 in | Wt 206.0 lb

## 2019-12-30 DIAGNOSIS — I10 Essential (primary) hypertension: Secondary | ICD-10-CM

## 2019-12-30 DIAGNOSIS — E782 Mixed hyperlipidemia: Secondary | ICD-10-CM | POA: Diagnosis not present

## 2019-12-30 DIAGNOSIS — R002 Palpitations: Secondary | ICD-10-CM | POA: Diagnosis not present

## 2019-12-30 DIAGNOSIS — Z87891 Personal history of nicotine dependence: Secondary | ICD-10-CM

## 2019-12-30 DIAGNOSIS — I471 Supraventricular tachycardia: Secondary | ICD-10-CM

## 2019-12-30 MED ORDER — METOPROLOL SUCCINATE ER 25 MG PO TB24: 12.5000 mg | ORAL_TABLET | Freq: Every day | ORAL | 3 refills | Status: DC

## 2019-12-30 MED ORDER — ATORVASTATIN CALCIUM 40 MG PO TABS: 40.0000 mg | ORAL_TABLET | Freq: Every day | ORAL | 3 refills | Status: DC

## 2019-12-30 NOTE — Telephone Encounter (Signed)
Enrolled patient for a 14 day Zio XT  monitor to be mailed to patients home  °

## 2019-12-30 NOTE — Assessment & Plan Note (Signed)
History of essential hypertension a blood pressure measured today 132/76.  She is on Avalide.

## 2019-12-30 NOTE — Assessment & Plan Note (Addendum)
History of hyperlipidemia on statin therapy with lipid profile performed 07/10/2019 revealing total cholesterol 197, LDL of 127 and HDL of 61.  Lipid profile performed by her PCP 10/16/2019 revealed total cholesterol of 206, LDL of 134 and HDL of 62.  Given her elevated LDL on pravastatin and to change her to atorvastatin 40 mg a day and we will recheck a lipid liver profile in 2 months

## 2019-12-30 NOTE — Progress Notes (Addendum)
01/05/2020 [name]   [birth year]  [id number]  Primary Physician Velna Hatchet, MD Primary Cardiologist: Lorretta Harp MD Garret Reddish, [place], [place]  HPI:  Gabriela [name] is a 59 y.o.  moderately overweight single [place] American female mother of one child, grandmother and 2 grandchildren who I last saw in the office2/23/2021. Her cardiac risk factors include family history with a mother who died myocardial infarction at age 18, hypertension, hyperlipidemia and continue tobacco abuse with 3 to 4 cigarettes a day.  She did have a heart catheterization which I performed 07/06/04 which wasn't normal. She does complain of occasional chest pain occurring every several months. These have not changed in frequency or severity.  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   She did have a negative GXT 09/19/2016.  Her most recent lipid profile performed 07/10/2019 revealed a total cholesterol of 197, LDL of 127 and HDL of 61.  Since I saw her back in February 2021 she was doing well until recently when she obtained her second Covid shot and several days later she developed dizziness and tachypalpitations requiring evaluation by Dr. Vivi Martens in the [facility].  Her laboratory exam was unremarkable but her EKG did show runs of SVT which responded to low-dose IV beta-blockade.  She says she has "not felt herself" since her Covid vaccine.  No outpatient medications have been marked as taking for the 12/30/19 encounter (Office Visit) with Lorretta Harp, MD.     No Known Allergies  Social History   Socioeconomic History  . Marital status: Single    Spouse name: Not on file  . Number of children: Not on file  . Years of education: Not on file  . Highest education level: Not on file  Occupational History  . Occupation: Unemployed     Employer: [employer]  Tobacco Use  . Smoking status: Former  Smoker    Packs/day: 0.10    Years: 18.00    Pack years: 1.80    Types: Cigarettes    Quit date: 07/14/2015    Years since quitting: 4.4  . Smokeless tobacco: Never Used  . Tobacco comment: only smokes 1 -2 cigarettes daily.  Vaping Use  . Vaping Use: Never used  Substance and Sexual Activity  . Alcohol use: Yes  . Drug use: No  . Sexual activity: Not Currently    Birth control/protection: Condom  Other Topics Concern  . Not on file  Social History Narrative  . Not on file   Social Determinants of Health   Financial Resource Strain: Not on file  Food Insecurity: Not on file  Transportation Needs: Not on file  Physical Activity: Not on file  Stress: Not on file  Social Connections: Not on file  Intimate Partner Violence: Not on file     Review of Systems: General: negative for chills, fever, night sweats or weight changes.  Cardiovascular: negative for chest pain, dyspnea on exertion, edema, orthopnea, palpitations, paroxysmal nocturnal dyspnea or shortness of breath Dermatological: negative for rash Respiratory: negative for cough or wheezing Urologic: negative for hematuria Abdominal: negative for nausea, vomiting, diarrhea, bright red blood per rectum, melena, or hematemesis Neurologic: negative for visual changes, syncope, or dizziness All other systems reviewed and are otherwise negative except as noted above.    Blood pressure 132/76, pulse 76, height 5\' 7"  (1.702 m), weight 206 lb (93.4 kg).  General  appearance: alert and no distress Neck: no adenopathy, no carotid bruit, no JVD, supple, symmetrical, trachea midline and thyroid not enlarged, symmetric, no tenderness/mass/nodules Lungs: clear to auscultation bilaterally Heart: regular rate and rhythm, S1, S2 normal, no murmur, click, rub or gallop Extremities: extremities normal, atraumatic, no cyanosis or edema Pulses: 2+ and symmetric Skin: Skin color, texture, turgor normal. No rashes or lesions Neurologic:  Alert and oriented X 3, normal strength and tone. Normal symmetric reflexes. Normal coordination and gait  EKG not performed today  ASSESSMENT AND PLAN:   HLD (hyperlipidemia) History of hyperlipidemia on statin therapy with lipid profile performed 07/10/2019 revealing total cholesterol 197, LDL of 127 and HDL of 61.  Lipid profile performed by her PCP 10/16/2019 revealed total cholesterol of 206, LDL of 134 and HDL of 62.  Given her elevated LDL on pravastatin and to change her to atorvastatin 40 mg a day and we will recheck a lipid liver profile in 2 months  Ex-light cigarette smoker (1-9 per day) Discontinued  Hypertension History of essential hypertension a blood pressure measured today 132/76.  She is on Avalide.  PSVT (paroxysmal supraventricular tachycardia) ([place]) Ms. Drawdy was seen in the ER on 12 4 by Dr. Zenia Resides 3 days after her second Covid vaccine dose.  She felt poorly for several days after receiving the injection and went to the ER with feelings of lightheadedness and tachypalpitations.  Her work-up was unremarkable other than EKG that showed SVT which resolved with administration of small doses of IV beta-blocker.  She has had no recurrent episodes.  I am going to begin her on low-dose beta-blocker empirically.  Muscle dedicated 2 weeks Zio patch.  Finally, I am going to obtain a 2D echo cardiogram to further evaluate.      Lorretta Harp MD FACP,FACC,FAHA, [facility] 01/05/2020 4:16 PM

## 2019-12-30 NOTE — Patient Instructions (Addendum)
Medication Instructions: Start taking Toprolol-xl (metoprolol succinate) 12.5mg  once daily.   *If you need a refill on your cardiac medications before your next appointment, please call your pharmacy*   Lab Work: Your physician recommends that you return for lab work in: 2 months: lipid/liver profile.  If you have labs (blood work) drawn today and your tests are completely normal, you will receive your results only by: Marland Kitchen MyChart Message (if you have MyChart) OR . A paper copy in the mail If you have any lab test that is abnormal or we need to change your treatment, we will call you to review the results.   Testing/Procedures: Your physician has requested that you have an echocardiogram. Echocardiography is a painless test that uses sound waves to create images of your heart. It provides your doctor with information about the size and shape of your heart and how well your heart's chambers and valves are working. This procedure takes approximately one hour. There are no restrictions for this procedure. [place]. [employer]. [address].   ZIO XT- Long Term Monitor Instructions   Your physician has requested you wear your ZIO patch monitor 14 days.   This is a single patch monitor.  [employer] supplies one patch monitor per enrollment.  Additional stickers are not available.   Please do not apply patch if you will be having a Nuclear Stress Test, Echocardiogram, Cardiac CT, MRI, or Chest Xray during the time frame you would be wearing the monitor. The patch cannot be worn during these tests.  You cannot remove and re-apply the ZIO XT patch monitor.   Your ZIO patch monitor will be sent USPS Priority mail from [facility] directly to your home address. The monitor may also be mailed to a PO BOX if home delivery is not available.   It may take 3-5 days to receive your monitor after you have been enrolled.   Once you have received you monitor, please review enclosed instructions.  Your  monitor has already been registered assigning a specific monitor serial # to you.   Applying the monitor   Shave hair from upper left chest.   Hold abrader disc by orange tab.  Rub abrader in 40 strokes over left upper chest as indicated in your monitor instructions.   Clean area with 4 enclosed alcohol pads .  Use all pads to assure are is cleaned thoroughly.  Let dry.   Apply patch as indicated in monitor instructions.  Patch will be place under collarbone on left side of chest with arrow pointing upward.   Rub patch adhesive wings for 2 minutes.Remove white label marked "1".  Remove white label marked "2".  Rub patch adhesive wings for 2 additional minutes.   While looking in a mirror, press and release button in center of patch.  A small green light will flash 3-4 times .  This will be your only indicator the monitor has been turned on.     Do not shower for the first 24 hours.  You may shower after the first 24 hours.   Press button if you feel a symptom. You will hear a small click.  Record Date, Time and Symptom in the Patient Log Book.   When you are ready to remove patch, follow instructions on last 2 pages of Patient Log Book.  Stick patch monitor onto last page of Patient Log Book.   Place Patient Log Book in [place] box.  Use locking tab on box and tape box  closed securely.  The [employer] and [employer] has [employer] on it.  Please place in mailbox as soon as possible.  Your physician should have your test results approximately 7 days after the monitor has been mailed back to [facility].   Call [employer] at [phone] if you have questions regarding your ZIO XT patch monitor.  Call them immediately if you see an orange light blinking on your monitor.   If your monitor falls off in less than 4 days contact our Monitor department at [phone].  If your monitor becomes loose or falls off after 4 days call [employer] at [phone] for suggestions on  securing your monitor.   Follow-Up: At [facility], you and your health needs are our priority.  As part of our continuing mission to provide you with exceptional heart care, we have created designated Provider Care Teams.  These Care Teams include your primary Cardiologist (physician) and Advanced Practice Providers (APPs -  Physician Assistants and Nurse Practitioners) who all work together to provide you with the care you need, when you need it.  We recommend signing up for the patient portal called "MyChart".  Sign up information is provided on this After Visit Summary.  MyChart is used to connect with patients for Virtual Visits (Telemedicine).  Patients are able to view lab/test results, encounter notes, upcoming appointments, etc.  Non-urgent messages can be sent to your provider as well.   To learn more about what you can do with MyChart, go to [email or web address].    Your next appointment:   3 month(s)  The format for your next appointment:   In Person  Provider:   Quay Burow, MD

## 2019-12-30 NOTE — Assessment & Plan Note (Signed)
Gabriela [name] was seen in the ER on 12 4 by Dr. Zenia Resides 3 days after her second Covid vaccine dose.  She felt poorly for several days after receiving the injection and went to the ER with feelings of lightheadedness and tachypalpitations.  Her work-up was unremarkable other than EKG that showed SVT which resolved with administration of small doses of IV beta-blocker.  She has had no recurrent episodes.  I am going to begin her on low-dose beta-blocker empirically.  Muscle dedicated 2 weeks Zio patch.  Finally, I am going to obtain a 2D echo cardiogram to further evaluate.

## 2019-12-30 NOTE — Assessment & Plan Note (Signed)
Discontinued

## 2020-01-01 ENCOUNTER — Ambulatory Visit (INDEPENDENT_AMBULATORY_CARE_PROVIDER_SITE_OTHER): Payer: No Typology Code available for payment source

## 2020-01-01 DIAGNOSIS — R002 Palpitations: Secondary | ICD-10-CM | POA: Diagnosis not present

## 2020-01-01 DIAGNOSIS — I471 Supraventricular tachycardia: Secondary | ICD-10-CM

## 2020-01-25 ENCOUNTER — Other Ambulatory Visit (HOSPITAL_COMMUNITY): Payer: No Typology Code available for payment source

## 2020-01-26 ENCOUNTER — Encounter (HOSPITAL_COMMUNITY): Payer: Self-pay | Admitting: Cardiovascular Disease

## 2020-01-26 DIAGNOSIS — E785 Hyperlipidemia, unspecified: Secondary | ICD-10-CM | POA: Diagnosis not present

## 2020-01-26 DIAGNOSIS — I10 Essential (primary) hypertension: Secondary | ICD-10-CM | POA: Diagnosis not present

## 2020-01-26 DIAGNOSIS — E669 Obesity, unspecified: Secondary | ICD-10-CM | POA: Diagnosis not present

## 2020-01-27 ENCOUNTER — Other Ambulatory Visit: Payer: Self-pay

## 2020-01-27 ENCOUNTER — Ambulatory Visit (HOSPITAL_COMMUNITY): Payer: BC Managed Care – PPO | Attending: Cardiology

## 2020-01-27 DIAGNOSIS — I10 Essential (primary) hypertension: Secondary | ICD-10-CM | POA: Insufficient documentation

## 2020-01-27 DIAGNOSIS — E782 Mixed hyperlipidemia: Secondary | ICD-10-CM | POA: Diagnosis not present

## 2020-01-27 DIAGNOSIS — I471 Supraventricular tachycardia: Secondary | ICD-10-CM | POA: Diagnosis not present

## 2020-01-27 DIAGNOSIS — R002 Palpitations: Secondary | ICD-10-CM

## 2020-01-27 LAB — ECHOCARDIOGRAM COMPLETE
Area-P 1/2: 2.48 cm2
S' Lateral: 2.5 cm

## 2020-02-02 DIAGNOSIS — U071 COVID-19: Secondary | ICD-10-CM | POA: Diagnosis not present

## 2020-02-02 DIAGNOSIS — R0981 Nasal congestion: Secondary | ICD-10-CM | POA: Diagnosis not present

## 2020-02-04 ENCOUNTER — Other Ambulatory Visit: Payer: Self-pay

## 2020-02-16 ENCOUNTER — Other Ambulatory Visit: Payer: Self-pay

## 2020-02-16 ENCOUNTER — Encounter (HOSPITAL_COMMUNITY): Payer: Self-pay | Admitting: Emergency Medicine

## 2020-02-16 ENCOUNTER — Emergency Department (HOSPITAL_COMMUNITY)
Admission: EM | Admit: 2020-02-16 | Discharge: 2020-02-16 | Disposition: A | Discharge status: Home / Self Care | Payer: BC Managed Care – PPO | Attending: Emergency Medicine | Admitting: Emergency Medicine

## 2020-02-16 DIAGNOSIS — U071 COVID-19: Secondary | ICD-10-CM | POA: Diagnosis not present

## 2020-02-16 DIAGNOSIS — I10 Essential (primary) hypertension: Secondary | ICD-10-CM | POA: Insufficient documentation

## 2020-02-16 DIAGNOSIS — Z79899 Other long term (current) drug therapy: Secondary | ICD-10-CM | POA: Insufficient documentation

## 2020-02-16 DIAGNOSIS — Z5321 Procedure and treatment not carried out due to patient leaving prior to being seen by health care provider: Secondary | ICD-10-CM | POA: Diagnosis not present

## 2020-02-16 NOTE — ED Triage Notes (Signed)
Pt reports bp in the 200s at home, takes meds for her HTN and denies any missed doses. Denies headache. A/ox4, ambulatory.

## 2020-02-16 NOTE — ED Notes (Signed)
Pt left without being seen.

## 2020-02-18 ENCOUNTER — Other Ambulatory Visit: Payer: Self-pay

## 2020-02-18 ENCOUNTER — Ambulatory Visit (HOSPITAL_COMMUNITY)
Admission: EM | Admit: 2020-02-18 | Discharge: 2020-02-18 | Disposition: A | Discharge status: Home / Self Care | Payer: BC Managed Care – PPO | Attending: Family Medicine | Admitting: Family Medicine

## 2020-02-18 ENCOUNTER — Encounter (HOSPITAL_COMMUNITY): Payer: Self-pay

## 2020-02-18 DIAGNOSIS — I10 Essential (primary) hypertension: Secondary | ICD-10-CM | POA: Diagnosis not present

## 2020-02-18 NOTE — ED Triage Notes (Signed)
Pt presents with elevated blood pressure over past few days.

## 2020-02-18 NOTE — Discharge Instructions (Signed)
Keep a log of foods/ Blood pressure See your usual doctor

## 2020-02-18 NOTE — ED Provider Notes (Signed)
[place]    CSN: [id number] Arrival date & time: 02/18/20  1907      History   Chief Complaint Chief Complaint  Patient presents with  . Hypertension    HPI Gabriela [name] is a 60 y.o. female.   HPI   Patient has hypertension. She feels her hypertension has been more difficult to manage since she had Covid. She states she has times where the blood pressure goes up and down. Today she had a dizzy spell. She took her blood pressure and it was more elevated than usual. She tried to go to the emergency room. There was a long wait. She came here instead. Her blood pressure here is 153/85. She reports blood pressures at home in the 150-160/80-90 range. I explained to her that these are not high enough to be worrisome. She needs to make an appointment with her primary care doctor for discussion on blood pressure. Her dizzy spell resolved completely. She is currently feeling well  Past Medical History:  Diagnosis Date  . Anemia   . Arthritis    OA  . Benign colon polyp    adenomatous  . Chest pain    a. normal cors by cath in 2006 b. calcium score of 0 by Coronary CT in 11/2011  . Dysmenorrhea   . Fibroid   . Hip pain    left hip  . Hypertension   . Irregular heart beat   . Smoker    ONE PACK PER WEEK    Patient Active Problem List   Diagnosis Date Noted  . PSVT (paroxysmal supraventricular tachycardia) ([place]) 12/30/2019  . Primary osteoarthritis of hip 10/15/2017  . Primary osteoarthritis of left hip 10/02/2017  . Primary osteoarthritis of right knee 10/02/2017  . Fatigue 05/05/2015  . Constipation 05/05/2015  . Tobacco abuse 10/12/2014  . Insomnia 09/24/2014  . Mastodynia, female 07/12/2014  . Ankle edema 06/04/2014  . Abdominal pain 02/02/2014  . Chest pain 09/23/2013  . RUQ pain 08/26/2013  . Sprain of ankle, unspecified site 06/08/2013  . Greater trochanteric bursitis of left hip 12/07/2011  . Hot flashes, menopausal 08/09/2011  . Dyspepsia  04/27/2011  . Stress 04/17/2011  . Ex-light cigarette smoker (1-9 per day)   . Hypertension   . Hx of adenomatous colonic polyps   . Overweight(278.02) 02/28/2009  . HLD (hyperlipidemia) 03/24/2008  . DEPRESSION, MAJOR, MILD 11/13/2007    Past Surgical History:  Procedure Laterality Date  . ABDOMINAL HYSTERECTOMY  06/2001   Toney Rakes, M.D. PARTIAL  . CARDIAC CATHETERIZATION  2003   Dr Gwenlyn Found  . CARDIOLITE MYOCARDIAL PERFUSION  06/07/03   NO PERFUSION ABNORMALITY. EF% 68%.  Marland Kitchen GYNECOLOGIC CRYOSURGERY  1993   DYSPLASIA  . LOWER EXT ARTERIAL  07/28/08   NORMAL  . TOTAL HIP ARTHROPLASTY Left 10/15/2017   Procedure: LEFT TOTAL HIP ARTHROPLASTY ANTERIOR APPROACH;  Surgeon: [name], MD;  Location: [facility];  Service: Orthopedics;  Laterality: Left;  . TRANSTHORACIC ECHOCARDIOGRAM  07/03/04   TRACE MR,TRACE TR,TRACE PVR  . UMBILICAL HERNIA REPAIR     AS A CHILD    OB History    Gravida  3   Para  1   Term  0   Preterm  0   AB  2   Living  1     SAB  0   IAB  2   Ectopic  0   Multiple  0   Live Births  Home Medications    Prior to Admission medications   Medication Sig Start Date End Date Taking? Authorizing Provider  atorvastatin (LIPITOR) 40 MG tablet Take 40 mg by mouth at bedtime. 12/30/19   [provider]  fluticasone (FLONASE) 50 MCG/ACT nasal spray Place 1 spray into both nostrils daily as needed for allergies. 08/19/19   [provider]  hydrOXYzine (ATARAX/VISTARIL) 10 MG tablet Take 1 tablet (10 mg total) by mouth 3 (three) times daily as needed for itching. 04/18/16   Haney, Amedeo Plenty, MD  irbesartan-hydrochlorothiazide (AVALIDE) 150-12.5 MG tablet Take 1 tablet by mouth daily. 08/12/17   [provider]  metoprolol succinate (TOPROL-XL) 25 MG 24 hr tablet Take 0.5 tablets (12.5 mg total) by mouth daily. Take with or immediately following a meal. 12/30/19 03/29/20  Lorretta Harp, MD  Multiple Vitamin  (MULTIVITAMIN WITH MINERALS) TABS tablet Take 1 tablet by mouth daily.    [provider]  TURMERIC PO Take 1 tablet by mouth 2 (two) times daily.    [provider]    Family History Family History  Problem Relation Age of Onset  . Heart disease Mother   . Hypertension Mother   . Stroke Father   . Colon cancer Neg Hx   . Stomach cancer Neg Hx     Social History Social History   Tobacco Use  . Smoking status: Former Smoker    Packs/day: 0.10    Years: 18.00    Pack years: 1.80    Types: Cigarettes    Quit date: 07/14/2015    Years since quitting: 4.6  . Smokeless tobacco: Never Used  . Tobacco comment: only smokes 1 -2 cigarettes daily.  Vaping Use  . Vaping Use: Never used  Substance Use Topics  . Alcohol use: Yes  . Drug use: No     Allergies   Patient has no known allergies.   Review of Systems Review of Systems See HPI  Physical Exam Triage Vital Signs ED Triage Vitals  Enc Vitals Group     BP 02/18/20 1919 (!) 153/85     Pulse Rate 02/18/20 1919 88     Resp 02/18/20 1919 17     Temp 02/18/20 1919 98.9 F (37.2 C)     Temp Source 02/18/20 1919 Oral     SpO2 02/18/20 1919 100 %     Weight --      Height --      Head Circumference --      Peak Flow --      Pain Score 02/18/20 1918 0     Pain Loc --      Pain Edu? --      Excl. in [place]? --    No data found.  Updated Vital Signs BP (!) 153/85 (BP Location: Right Arm)   Pulse 88   Temp 98.9 F (37.2 C) (Oral)   Resp 17   SpO2 100%      Physical Exam Constitutional:      General: She is not in acute distress.    Appearance: She is well-developed, normal weight and well-nourished.  HENT:     Head: Normocephalic and atraumatic.     Right Ear: Tympanic membrane and ear canal normal.     Left Ear: Tympanic membrane and ear canal normal.     Nose: Nose normal.     Mouth/Throat:     Mouth: Oropharynx is clear and moist. Mucous membranes are moist.     Pharynx:  No posterior  oropharyngeal erythema.  Eyes:     Conjunctiva/sclera: Conjunctivae normal.     Pupils: Pupils are equal, round, and reactive to light.  Cardiovascular:     Rate and Rhythm: Normal rate and regular rhythm.     Heart sounds: Normal heart sounds.  Pulmonary:     Effort: Pulmonary effort is normal. No respiratory distress.     Breath sounds: Normal breath sounds.  Abdominal:     General: There is no distension.     Palpations: Abdomen is soft.  Musculoskeletal:        General: No edema. Normal range of motion.     Cervical back: Normal range of motion and neck supple.  Skin:    General: Skin is warm and dry.  Neurological:     General: No focal deficit present.     Mental Status: She is alert.  Psychiatric:        Behavior: Behavior normal.    See HPI  UC Treatments / Results  Labs (all labs ordered are listed, but only abnormal results are displayed) Labs Reviewed - No data to display  EKG   Radiology No results found.  Procedures Procedures (including critical care time)  Medications Ordered in UC Medications - No data to display  Initial Impression / Assessment and Plan / UC Course  I have reviewed the triage vital signs and the nursing notes.  Pertinent labs & imaging results that were available during my care of the patient were reviewed by me and considered in my medical decision making (see chart for details).     See HPI Final Clinical Impressions(s) / UC Diagnoses   Final diagnoses:  Primary hypertension     Discharge Instructions     Keep a log of foods/ Blood pressure See your usual doctor   ED Prescriptions    None     PDMP not reviewed this encounter.   Raylene Everts, MD 02/18/20 2005

## 2020-02-24 ENCOUNTER — Telehealth: Payer: Self-pay | Admitting: Cardiovascular Disease

## 2020-02-24 NOTE — Telephone Encounter (Signed)
*  STAT* If patient is at the pharmacy, call can be transferred to refill team.   1. Which medications need to be refilled? (please list name of each medication and dose if known) metoprolol succinate (TOPROL-XL) 25 MG 24 hr tablet  2. Which pharmacy/location (including street and city if local pharmacy) is medication to be sent to? [id number] - [place], [place] - [address] AT [place]  3. Do they need a 30 day or 90 day supply? 90 day supply  Patient is completely out of medication.

## 2020-03-24 DIAGNOSIS — E782 Mixed hyperlipidemia: Secondary | ICD-10-CM | POA: Diagnosis not present

## 2020-03-25 LAB — LIPID PANEL
Chol/HDL Ratio: 2.6 ratio (ref 0.0–4.4)
Cholesterol, Total: 182 mg/dL (ref 100–199)
HDL: 69 mg/dL (ref >39)
LDL Chol Calc (NIH): 106 mg/dL — ABNORMAL HIGH (ref 0–99)
Triglycerides: 34 mg/dL (ref 0–149)
VLDL Cholesterol Cal: 7 mg/dL (ref 5–40)

## 2020-03-25 LAB — HEPATIC FUNCTION PANEL
ALT: 20 IU/L (ref 0–32)
AST: 14 IU/L (ref 0–40)
Albumin: 4.9 g/dL (ref 3.8–4.9)
Alkaline Phosphatase: 156 IU/L — ABNORMAL HIGH (ref 44–121)
Bilirubin Total: 0.6 mg/dL (ref 0.0–1.2)
Bilirubin, Direct: 0.16 mg/dL (ref 0.00–0.40)
Total Protein: 7.6 g/dL (ref 6.0–8.5)

## 2020-03-26 DIAGNOSIS — Z6832 Body mass index (BMI) 32.0-32.9, adult: Secondary | ICD-10-CM | POA: Diagnosis not present

## 2020-03-26 DIAGNOSIS — M25562 Pain in left knee: Secondary | ICD-10-CM | POA: Diagnosis not present

## 2020-03-29 ENCOUNTER — Encounter: Payer: Self-pay | Admitting: Cardiovascular Disease

## 2020-03-29 ENCOUNTER — Other Ambulatory Visit: Payer: Self-pay

## 2020-03-29 ENCOUNTER — Ambulatory Visit (INDEPENDENT_AMBULATORY_CARE_PROVIDER_SITE_OTHER): Payer: BC Managed Care – PPO | Admitting: Cardiovascular Disease

## 2020-03-29 VITALS — BP 150/71 | HR 53 | Ht 67.0 in | Wt 209.6 lb

## 2020-03-29 DIAGNOSIS — I471 Supraventricular tachycardia: Secondary | ICD-10-CM | POA: Diagnosis not present

## 2020-03-29 DIAGNOSIS — E782 Mixed hyperlipidemia: Secondary | ICD-10-CM

## 2020-03-29 DIAGNOSIS — I10 Essential (primary) hypertension: Secondary | ICD-10-CM | POA: Diagnosis not present

## 2020-03-29 DIAGNOSIS — Z720 Tobacco use: Secondary | ICD-10-CM

## 2020-03-29 NOTE — Assessment & Plan Note (Signed)
History of palpitations that occurred after her Covid vaccine.  She did have a 2D echo that was normal and an event monitor that showed occasional PVCs, PACs and short runs of SVT.  She is on a low-dose beta-blocker.

## 2020-03-29 NOTE — Patient Instructions (Signed)
Medication Instructions:  Your physician recommends that you continue on your current medications as directed. Please refer to the Current Medication list given to you today.  *If you need a refill on your cardiac medications before your next appointment, please call your pharmacy*   Follow-Up: At [facility], you and your health needs are our priority.  As part of our continuing mission to provide you with exceptional heart care, we have created designated Provider Care Teams.  These Care Teams include your primary Cardiologist (physician) and Advanced Practice Providers (APPs -  Physician Assistants and Nurse Practitioners) who all work together to provide you with the care you need, when you need it.  We recommend signing up for the patient portal called "MyChart".  Sign up information is provided on this After Visit Summary.  MyChart is used to connect with patients for Virtual Visits (Telemedicine).  Patients are able to view lab/test results, encounter notes, upcoming appointments, etc.  Non-urgent messages can be sent to your provider as well.   To learn more about what you can do with MyChart, go to [email or web address].    Your next appointment:   6 month(s)  The format for your next appointment:   In Person  Provider:   You will see one of the following Advanced Practice Providers on your designated Care Team:    Callie Goodrich, PA-C  Jesse Cleaver, FNP  Then, Jonathan Berry, MD will plan to see you again in 12 month(s). 

## 2020-03-29 NOTE — Assessment & Plan Note (Signed)
History of essential hypertension a blood pressure measured today 150/71.  She is on low-dose Toprol in addition to Avalide.

## 2020-03-29 NOTE — Assessment & Plan Note (Signed)
History of ongoing tobacco use of 3 to 4 cigarettes a day.

## 2020-03-29 NOTE — Progress Notes (Signed)
03/29/2020 Gabriela [name]   Aug 20, 1960  [id number]  Primary Physician Velna Hatchet, MD Primary Cardiologist: Lorretta Harp MD Garret [facility], [place], [place]  HPI:  Gabriela [name] is a 60 y.o.   moderately overweight single [place] American female mother of one child, grandmother and 2 grandchildren who I last saw in the 01/05/2020. Her cardiac risk factors include family history with a mother who died myocardial infarction at age 34, hypertension, hyperlipidemia andcontinue tobacco abuse with 3 to 4 cigarettes a day. She did have a heart catheterization which I performed 07/06/04 which wasn't normal. She does complain of occasional chest pain occurring every several months. These have not changed in frequency or severity.  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  She did have a negative GXT 09/19/2016. Her most recent lipid profile performed 07/10/2019 revealed a total cholesterol of 197, LDL of 127 and HDL of 61.  She  obtained her second Covid shot and several days later she developed dizziness and tachypalpitations requiring evaluation by Dr. Vivi Martens in the [facility].  Her laboratory exam was unremarkable but her EKG did show runs of SVT which responded to low-dose IV beta-blockade.  She says she has "not felt herself" since her Covid vaccine.  Since I saw her 3 months ago I did get a 2D echo which was fairly unremarkable revealing normal ejection fraction with grade 1 diastolic dysfunction.  An event monitor showed occasional PVCs, PACs and short runs of SVT.  She is on a low-dose beta-blocker.    Current Meds  Medication Sig   atorvastatin (LIPITOR) 40 MG tablet Take 40 mg by mouth at bedtime.   fluticasone (FLONASE) 50 MCG/ACT nasal spray Place 1 spray into both nostrils daily as needed for allergies.   hydrOXYzine (ATARAX/VISTARIL) 10 MG tablet Take 1 tablet (10 mg total) by mouth 3  (three) times daily as needed for itching.   irbesartan-hydrochlorothiazide (AVALIDE) 150-12.5 MG tablet Take 1 tablet by mouth daily.   metoprolol succinate (TOPROL-XL) 25 MG 24 hr tablet Take 0.5 tablets (12.5 mg total) by mouth daily. Take with or immediately following a meal.   Multiple Vitamin (MULTIVITAMIN WITH MINERALS) TABS tablet Take 1 tablet by mouth daily.   TURMERIC PO Take 1 tablet by mouth 2 (two) times daily.     No Known Allergies  Social History   Socioeconomic History   Marital status: Single    Spouse name: Not on file   Number of children: Not on file   Years of education: Not on file   Highest education level: Not on file  Occupational History   Occupation: Unemployed     Employer: [employer]  Tobacco Use   Smoking status: Former Smoker    Packs/day: 0.10    Years: 18.00    Pack years: 1.80    Types: Cigarettes    Quit date: 07/14/2015    Years since quitting: 4.7   Smokeless tobacco: Never Used   Tobacco comment: only smokes 1 -2 cigarettes daily.  Vaping Use   Vaping Use: Never used  Substance and Sexual Activity   Alcohol use: Yes   Drug use: No   Sexual activity: Not Currently    Birth control/protection: Condom  Other Topics Concern   Not on file  Social History Narrative   Not on file   Social Determinants of Health   Financial Resource Strain: Not on  file  Food Insecurity: Not on file  Transportation Needs: Not on file  Physical Activity: Not on file  Stress: Not on file  Social Connections: Not on file  Intimate Partner Violence: Not on file     Review of Systems: General: negative for chills, fever, night sweats or weight changes.  Cardiovascular: negative for chest pain, dyspnea on exertion, edema, orthopnea, palpitations, paroxysmal nocturnal dyspnea or shortness of breath Dermatological: negative for rash Respiratory: negative for cough or wheezing Urologic: negative for hematuria Abdominal: negative  for nausea, vomiting, diarrhea, bright red blood per rectum, melena, or hematemesis Neurologic: negative for visual changes, syncope, or dizziness All other systems reviewed and are otherwise negative except as noted above.    Blood pressure (!) 150/71, pulse (!) 53, height 5\' 7"  (1.702 m), weight 209 lb 9.6 oz (95.1 kg), SpO2 97 %.  General appearance: alert and no distress Neck: no adenopathy, no carotid bruit, no JVD, supple, symmetrical, trachea midline and thyroid not enlarged, symmetric, no tenderness/mass/nodules Lungs: clear to auscultation bilaterally Heart: regular rate and rhythm, S1, S2 normal, no murmur, click, rub or gallop Extremities: extremities normal, atraumatic, no cyanosis or edema Pulses: 2+ and symmetric Skin: Skin color, texture, turgor normal. No rashes or lesions Neurologic: Alert and oriented X 3, normal strength and tone. Normal symmetric reflexes. Normal coordination and gait  EKG sinus bradycardia 53 without ST or T wave changes.  I Personally reviewed this EKG.  ASSESSMENT AND PLAN:   HLD (hyperlipidemia) History of hyperlipidemia on statin therapy with lipid profile performed 03/24/2020 revealing total cholesterol 182, LDL of 106 and HDL of 69.  Hypertension History of essential hypertension a blood pressure measured today 150/71.  She is on low-dose Toprol in addition to Avalide.  Tobacco abuse History of ongoing tobacco use of 3 to 4 cigarettes a day.  PSVT (paroxysmal supraventricular tachycardia) ([facility]) History of palpitations that occurred after her Covid vaccine.  She did have a 2D echo that was normal and an event monitor that showed occasional PVCs, PACs and short runs of SVT.  She is on a low-dose beta-blocker.      Lorretta Harp MD FACP,FACC,FAHA, [facility] 03/29/2020 4:08 PM

## 2020-03-29 NOTE — Assessment & Plan Note (Signed)
History of hyperlipidemia on statin therapy with lipid profile performed 03/24/2020 revealing total cholesterol 182, LDL of 106 and HDL of 69.

## 2020-04-07 ENCOUNTER — Other Ambulatory Visit: Payer: Self-pay | Admitting: Internal Medicine

## 2020-04-07 DIAGNOSIS — Z09 Encounter for follow-up examination after completed treatment for conditions other than malignant neoplasm: Secondary | ICD-10-CM

## 2020-04-12 ENCOUNTER — Other Ambulatory Visit: Payer: Self-pay | Admitting: Internal Medicine

## 2020-04-12 DIAGNOSIS — N63 Unspecified lump in unspecified breast: Secondary | ICD-10-CM

## 2020-04-20 DIAGNOSIS — R1030 Lower abdominal pain, unspecified: Secondary | ICD-10-CM | POA: Diagnosis not present

## 2020-04-20 DIAGNOSIS — Z20822 Contact with and (suspected) exposure to covid-19: Secondary | ICD-10-CM | POA: Diagnosis not present

## 2020-04-20 DIAGNOSIS — J302 Other seasonal allergic rhinitis: Secondary | ICD-10-CM | POA: Diagnosis not present

## 2020-04-20 DIAGNOSIS — A084 Viral intestinal infection, unspecified: Secondary | ICD-10-CM | POA: Diagnosis not present

## 2020-04-24 DIAGNOSIS — K5900 Constipation, unspecified: Secondary | ICD-10-CM | POA: Diagnosis not present

## 2020-04-24 DIAGNOSIS — R1013 Epigastric pain: Secondary | ICD-10-CM | POA: Diagnosis not present

## 2020-04-24 DIAGNOSIS — R109 Unspecified abdominal pain: Secondary | ICD-10-CM | POA: Diagnosis not present

## 2020-04-29 ENCOUNTER — Ambulatory Visit
Admission: RE | Admit: 2020-04-29 | Discharge: 2020-04-29 | Disposition: A | Discharge status: Home / Self Care | Payer: BC Managed Care – PPO | Source: Ambulatory Visit | Attending: Internal Medicine | Admitting: Internal Medicine

## 2020-04-29 ENCOUNTER — Other Ambulatory Visit: Payer: Self-pay

## 2020-04-29 DIAGNOSIS — N63 Unspecified lump in unspecified breast: Secondary | ICD-10-CM

## 2020-04-29 DIAGNOSIS — R922 Inconclusive mammogram: Secondary | ICD-10-CM | POA: Diagnosis not present

## 2020-04-29 DIAGNOSIS — N6311 Unspecified lump in the right breast, upper outer quadrant: Secondary | ICD-10-CM | POA: Diagnosis not present

## 2020-04-29 DIAGNOSIS — Z09 Encounter for follow-up examination after completed treatment for conditions other than malignant neoplasm: Secondary | ICD-10-CM

## 2020-04-29 DIAGNOSIS — R928 Other abnormal and inconclusive findings on diagnostic imaging of breast: Secondary | ICD-10-CM | POA: Diagnosis not present

## 2020-05-04 DIAGNOSIS — I10 Essential (primary) hypertension: Secondary | ICD-10-CM | POA: Diagnosis not present

## 2020-05-04 DIAGNOSIS — M25552 Pain in left hip: Secondary | ICD-10-CM | POA: Diagnosis not present

## 2020-05-04 DIAGNOSIS — L299 Pruritus, unspecified: Secondary | ICD-10-CM | POA: Diagnosis not present

## 2020-05-16 DIAGNOSIS — R140 Abdominal distension (gaseous): Secondary | ICD-10-CM | POA: Diagnosis not present

## 2020-05-16 DIAGNOSIS — K5900 Constipation, unspecified: Secondary | ICD-10-CM | POA: Diagnosis not present

## 2020-05-16 DIAGNOSIS — R1032 Left lower quadrant pain: Secondary | ICD-10-CM | POA: Diagnosis not present

## 2020-05-16 DIAGNOSIS — K5730 Diverticulosis of large intestine without perforation or abscess without bleeding: Secondary | ICD-10-CM | POA: Diagnosis not present

## 2020-05-23 DIAGNOSIS — K5900 Constipation, unspecified: Secondary | ICD-10-CM | POA: Diagnosis not present

## 2020-05-23 DIAGNOSIS — R140 Abdominal distension (gaseous): Secondary | ICD-10-CM | POA: Diagnosis not present

## 2020-05-23 DIAGNOSIS — R1032 Left lower quadrant pain: Secondary | ICD-10-CM | POA: Diagnosis not present

## 2020-06-13 DIAGNOSIS — R140 Abdominal distension (gaseous): Secondary | ICD-10-CM | POA: Diagnosis not present

## 2020-06-13 DIAGNOSIS — K5900 Constipation, unspecified: Secondary | ICD-10-CM | POA: Diagnosis not present

## 2020-06-13 DIAGNOSIS — Z86010 Personal history of colonic polyps: Secondary | ICD-10-CM | POA: Diagnosis not present

## 2020-07-21 DIAGNOSIS — E785 Hyperlipidemia, unspecified: Secondary | ICD-10-CM | POA: Diagnosis not present

## 2020-08-02 ENCOUNTER — Other Ambulatory Visit (HOSPITAL_COMMUNITY): Payer: Self-pay | Admitting: Internal Medicine

## 2020-08-02 ENCOUNTER — Other Ambulatory Visit: Payer: Self-pay | Admitting: Internal Medicine

## 2020-08-02 DIAGNOSIS — M7989 Other specified soft tissue disorders: Secondary | ICD-10-CM

## 2020-08-02 DIAGNOSIS — R1011 Right upper quadrant pain: Secondary | ICD-10-CM

## 2020-08-02 DIAGNOSIS — Z0000 Encounter for general adult medical examination without abnormal findings: Secondary | ICD-10-CM | POA: Diagnosis not present

## 2020-08-02 DIAGNOSIS — Z1331 Encounter for screening for depression: Secondary | ICD-10-CM | POA: Diagnosis not present

## 2020-08-02 DIAGNOSIS — E785 Hyperlipidemia, unspecified: Secondary | ICD-10-CM | POA: Diagnosis not present

## 2020-08-02 DIAGNOSIS — Z1339 Encounter for screening examination for other mental health and behavioral disorders: Secondary | ICD-10-CM | POA: Diagnosis not present

## 2020-08-02 DIAGNOSIS — M79605 Pain in left leg: Secondary | ICD-10-CM

## 2020-08-03 ENCOUNTER — Ambulatory Visit (HOSPITAL_COMMUNITY)
Admission: RE | Admit: 2020-08-03 | Discharge: 2020-08-03 | Disposition: A | Discharge status: Home / Self Care | Payer: BC Managed Care – PPO | Source: Ambulatory Visit | Attending: Internal Medicine | Admitting: Internal Medicine

## 2020-08-03 ENCOUNTER — Other Ambulatory Visit: Payer: Self-pay

## 2020-08-03 DIAGNOSIS — R1011 Right upper quadrant pain: Secondary | ICD-10-CM

## 2020-08-03 DIAGNOSIS — K7689 Other specified diseases of liver: Secondary | ICD-10-CM | POA: Diagnosis not present

## 2020-08-03 DIAGNOSIS — M7989 Other specified soft tissue disorders: Secondary | ICD-10-CM | POA: Diagnosis not present

## 2020-08-03 DIAGNOSIS — M79605 Pain in left leg: Secondary | ICD-10-CM | POA: Insufficient documentation

## 2020-08-03 DIAGNOSIS — R109 Unspecified abdominal pain: Secondary | ICD-10-CM | POA: Diagnosis not present

## 2020-08-03 NOTE — Progress Notes (Signed)
Lower extremity venous has been completed.   Preliminary results in CV Proc.   Abram Sander 08/03/2020 2:29 PM

## 2020-08-05 ENCOUNTER — Other Ambulatory Visit: Payer: Self-pay | Admitting: Internal Medicine

## 2020-08-05 DIAGNOSIS — K838 Other specified diseases of biliary tract: Secondary | ICD-10-CM

## 2020-08-09 ENCOUNTER — Encounter (HOSPITAL_COMMUNITY): Payer: Self-pay

## 2020-08-09 ENCOUNTER — Ambulatory Visit (HOSPITAL_COMMUNITY): Admission: RE | Admit: 2020-08-09 | Payer: BC Managed Care – PPO | Source: Ambulatory Visit

## 2020-08-09 ENCOUNTER — Other Ambulatory Visit: Payer: Self-pay | Admitting: Internal Medicine

## 2020-08-09 DIAGNOSIS — K838 Other specified diseases of biliary tract: Secondary | ICD-10-CM

## 2020-08-31 DIAGNOSIS — K838 Other specified diseases of biliary tract: Secondary | ICD-10-CM | POA: Diagnosis not present

## 2020-08-31 DIAGNOSIS — K7689 Other specified diseases of liver: Secondary | ICD-10-CM | POA: Diagnosis not present

## 2020-09-30 DIAGNOSIS — M170 Bilateral primary osteoarthritis of knee: Secondary | ICD-10-CM | POA: Diagnosis not present

## 2020-09-30 DIAGNOSIS — M1712 Unilateral primary osteoarthritis, left knee: Secondary | ICD-10-CM | POA: Diagnosis not present

## 2020-09-30 DIAGNOSIS — M25551 Pain in right hip: Secondary | ICD-10-CM | POA: Diagnosis not present

## 2020-10-06 DIAGNOSIS — M7061 Trochanteric bursitis, right hip: Secondary | ICD-10-CM | POA: Diagnosis not present

## 2020-10-06 DIAGNOSIS — M1611 Unilateral primary osteoarthritis, right hip: Secondary | ICD-10-CM | POA: Diagnosis not present

## 2020-10-17 DIAGNOSIS — M1611 Unilateral primary osteoarthritis, right hip: Secondary | ICD-10-CM | POA: Diagnosis not present

## 2020-10-31 DIAGNOSIS — R748 Abnormal levels of other serum enzymes: Secondary | ICD-10-CM | POA: Diagnosis not present

## 2020-10-31 DIAGNOSIS — K7689 Other specified diseases of liver: Secondary | ICD-10-CM | POA: Diagnosis not present

## 2020-10-31 DIAGNOSIS — K838 Other specified diseases of biliary tract: Secondary | ICD-10-CM | POA: Diagnosis not present

## 2020-11-02 ENCOUNTER — Encounter: Payer: Self-pay | Admitting: Nurse Practitioner

## 2020-11-02 ENCOUNTER — Ambulatory Visit (INDEPENDENT_AMBULATORY_CARE_PROVIDER_SITE_OTHER): Payer: BC Managed Care – PPO | Admitting: Nurse Practitioner

## 2020-11-02 ENCOUNTER — Other Ambulatory Visit: Payer: Self-pay

## 2020-11-02 VITALS — BP 132/74 | Ht 66.0 in | Wt 218.0 lb

## 2020-11-02 DIAGNOSIS — Z01419 Encounter for gynecological examination (general) (routine) without abnormal findings: Secondary | ICD-10-CM

## 2020-11-02 DIAGNOSIS — R1031 Right lower quadrant pain: Secondary | ICD-10-CM | POA: Diagnosis not present

## 2020-11-02 DIAGNOSIS — Z780 Asymptomatic menopausal state: Secondary | ICD-10-CM | POA: Diagnosis not present

## 2020-11-02 NOTE — Progress Notes (Signed)
Gabriela [name] 02/19/1960 [id number]   History:  60 y.o. [id number] presents for annual exam. Has had intermittent RLQ abdominal pain almost in groin. The pain is sharp and doesn't seem to be associated with activity or certain positions. Reports Xray done recently of hip and was negative. No changes in bowel habits. Postmenopausal - no HRT. S/P 2003 TVH for fibroids. 1993 cryosurgery, subsequent paps normal. She has had a lot of work stress and she feels this is affecting her ability to lose weight, she has also picked up smoking again.   Gynecologic History No LMP recorded. Patient has had a hysterectomy.   Contraception/Family planning: status post hysterectomy Sexually active: No  Health Maintenance Last Pap: 10/20/2019. Results were: normal Last mammogram: 04/29/2020. Results were: stable benign right breast mass, no evidence of malignancy Last colonoscopy: 04/26/2016. Results were: tubular adenoma, 5-year recall Last Dexa: 10/21/2018. Results were: normal, 5 year follow up  Past medical history, past surgical history, family history and social history were all reviewed and documented in the EPIC chart. Works at [employer]. Son and oldest grandson live locally. 31 yo grandson in [place].  ROS:  A ROS was performed and pertinent positives and negatives are included.  Exam:  Vitals:   11/02/20 1329  BP: 132/74  Weight: 218 lb (98.9 kg)  Height: 5\' 6"  (1.676 m)    Body mass index is 35.19 kg/m.  General appearance:  Normal Thyroid:  Symmetrical, normal in size, without palpable masses or nodularity. Respiratory  Auscultation:  Clear without wheezing or rhonchi Cardiovascular  Auscultation:  Regular rate, without rubs, murmurs or gallops  Edema/varicosities:  Not grossly evident Abdominal  Soft,nontender, without masses, guarding or rebound.  Liver/spleen:  No organomegaly noted  Hernia:  None appreciated  Skin  Inspection:  Grossly normal   Breasts: Examined lying and  sitting.   Right: Without masses, retractions, discharge or axillary adenopathy.   Left: Without masses, retractions, discharge or axillary adenopathy. Genitourinary   Inguinal/mons:  Normal without inguinal adenopathy  External genitalia:  Normal appearing vulva with no masses, tenderness, or lesions  BUS/Urethra/Skene's glands:  Normal  Vagina:  Normal appearing with normal color and discharge, no lesions  Cervix:  Absent  Uterus:  Absent  Adnexa/parametria:     Rt: Normal in size, without masses or tenderness.   Lt: Normal in size, without masses or tenderness.  Anus and perineum: Normal  Digital rectal exam: Normal sphincter tone without palpated masses or tenderness  Patient informed chaperone available to be present for breast and pelvic exam. Patient has requested no chaperone to be present. Patient has been advised what will be completed during breast and pelvic exam.   Assessment/Plan:  60 y.o. [id number] for annual exam.   Well female exam with routine gynecological exam - Education provided on SBEs, importance of preventative screenings, current guidelines, high calcium diet, regular exercise, and multivitamin daily.  Labs with PCP.   Postmenopausal - no HRT. S/P 2003 TVH for fibroids. Complains of hot flashes and night sweats. Plans to be more consistent with OTC supplement.   Right lower quadrant pain - Plan: US PELVIS TRANSVAGINAL NON-OB (TV ONLY). Has had intermittent RLQ abdominal pain almost in groin area. The pain is sharp and doesn't seem to be associated with activity or certain positions. Reports Xray done recently of hip and was normal. No changes in bowel habits. Possibly musculoskeletal but she would like to rule out ovarian etiology.   Screening for cervical cancer - Normal  Pap history.  No longer screening per guidelines.   Screening for breast cancer - Stable benign right breast mass. Back to annual screenings. Normal breast exam today.  Screening for colon  cancer - 2018 colonoscopy. Will repeat at GI's recommended interval.   Screening for osteoporosis - Normal bone density 2020. Will repeat at 5 years per recommendation.   Follow up in 1 year for annual.     Gabriela [name] [facility], 1:58 PM 11/02/2020

## 2020-11-03 ENCOUNTER — Other Ambulatory Visit: Payer: BC Managed Care – PPO

## 2020-11-03 ENCOUNTER — Other Ambulatory Visit: Payer: BC Managed Care – PPO | Admitting: Obstetrics & Gynecology

## 2020-11-23 ENCOUNTER — Telehealth: Payer: Self-pay

## 2020-11-23 NOTE — Telephone Encounter (Signed)
Patient called because she is not able to schedule u/s at this time and she wanted [place] to know.  Costs were discussed with her and right now she cannot afford. She will call us back when she gets her Flex card and can schedule.

## 2021-01-03 ENCOUNTER — Other Ambulatory Visit: Payer: Self-pay | Admitting: Nurse Practitioner

## 2021-01-03 DIAGNOSIS — K838 Other specified diseases of biliary tract: Secondary | ICD-10-CM

## 2021-01-03 DIAGNOSIS — K7689 Other specified diseases of liver: Secondary | ICD-10-CM

## 2021-01-05 ENCOUNTER — Other Ambulatory Visit: Payer: Self-pay | Admitting: Cardiovascular Disease

## 2021-01-26 ENCOUNTER — Ambulatory Visit
Admission: RE | Admit: 2021-01-26 | Discharge: 2021-01-26 | Disposition: A | Discharge status: Home / Self Care | Payer: BC Managed Care – PPO | Source: Ambulatory Visit | Attending: Nurse Practitioner | Admitting: Nurse Practitioner

## 2021-01-26 DIAGNOSIS — K838 Other specified diseases of biliary tract: Secondary | ICD-10-CM

## 2021-01-26 DIAGNOSIS — K760 Fatty (change of) liver, not elsewhere classified: Secondary | ICD-10-CM | POA: Diagnosis not present

## 2021-01-26 DIAGNOSIS — R109 Unspecified abdominal pain: Secondary | ICD-10-CM | POA: Diagnosis not present

## 2021-01-26 DIAGNOSIS — R935 Abnormal findings on diagnostic imaging of other abdominal regions, including retroperitoneum: Secondary | ICD-10-CM | POA: Diagnosis not present

## 2021-01-26 DIAGNOSIS — K7689 Other specified diseases of liver: Secondary | ICD-10-CM

## 2021-01-26 MED ORDER — GADOBENATE DIMEGLUMINE 529 MG/ML IV SOLN
18.0000 mL | Freq: Once | INTRAVENOUS | Status: AC | PRN
  Administered 2021-01-26: 18 mL via INTRAVENOUS

## 2021-01-31 ENCOUNTER — Other Ambulatory Visit: Payer: Self-pay | Admitting: Cardiovascular Disease

## 2021-03-09 ENCOUNTER — Other Ambulatory Visit: Payer: Self-pay | Admitting: Cardiovascular Disease

## 2021-03-24 ENCOUNTER — Other Ambulatory Visit: Payer: Self-pay | Admitting: Cardiovascular Disease

## 2021-03-28 ENCOUNTER — Other Ambulatory Visit: Payer: Self-pay | Admitting: Internal Medicine

## 2021-03-28 DIAGNOSIS — Z1231 Encounter for screening mammogram for malignant neoplasm of breast: Secondary | ICD-10-CM

## 2021-03-30 DIAGNOSIS — R0981 Nasal congestion: Secondary | ICD-10-CM | POA: Diagnosis not present

## 2021-03-30 DIAGNOSIS — M542 Cervicalgia: Secondary | ICD-10-CM | POA: Diagnosis not present

## 2021-04-18 ENCOUNTER — Encounter (HOSPITAL_COMMUNITY): Payer: Self-pay

## 2021-04-18 ENCOUNTER — Other Ambulatory Visit: Payer: Self-pay

## 2021-04-18 ENCOUNTER — Emergency Department (HOSPITAL_COMMUNITY): Payer: BC Managed Care – PPO

## 2021-04-18 ENCOUNTER — Emergency Department (HOSPITAL_COMMUNITY)
Admission: EM | Admit: 2021-04-18 | Discharge: 2021-04-18 | Disposition: A | Discharge status: Home / Self Care | Payer: BC Managed Care – PPO | Attending: Emergency Medicine | Admitting: Emergency Medicine

## 2021-04-18 DIAGNOSIS — R0789 Other chest pain: Secondary | ICD-10-CM

## 2021-04-18 DIAGNOSIS — J029 Acute pharyngitis, unspecified: Secondary | ICD-10-CM | POA: Diagnosis not present

## 2021-04-18 DIAGNOSIS — N9489 Other specified conditions associated with female genital organs and menstrual cycle: Secondary | ICD-10-CM | POA: Insufficient documentation

## 2021-04-18 DIAGNOSIS — R079 Chest pain, unspecified: Secondary | ICD-10-CM | POA: Insufficient documentation

## 2021-04-18 DIAGNOSIS — B349 Viral infection, unspecified: Secondary | ICD-10-CM | POA: Diagnosis not present

## 2021-04-18 DIAGNOSIS — R059 Cough, unspecified: Secondary | ICD-10-CM | POA: Diagnosis not present

## 2021-04-18 LAB — CBC
HCT: 39.6 % (ref 36.0–46.0)
Hemoglobin: 12.8 g/dL (ref 12.0–15.0)
MCH: 27.9 pg (ref 26.0–34.0)
MCHC: 32.3 g/dL (ref 30.0–36.0)
MCV: 86.5 fL (ref 80.0–100.0)
Platelets: 348 10*3/uL (ref 150–400)
RBC: 4.58 MIL/uL (ref 3.87–5.11)
RDW: 13.1 % (ref 11.5–15.5)
WBC: 7.2 10*3/uL (ref 4.0–10.5)
nRBC: 0 % (ref 0.0–0.2)

## 2021-04-18 LAB — BASIC METABOLIC PANEL
Anion gap: 8 (ref 5–15)
BUN: 17 mg/dL (ref 6–20)
CO2: 28 mmol/L (ref 22–32)
Calcium: 9.3 mg/dL (ref 8.9–10.3)
Chloride: 105 mmol/L (ref 98–111)
Creatinine, Ser: 0.57 mg/dL (ref 0.44–1.00)
GFR, Estimated: >60 mL/min (ref >60)
Glucose, Bld: 119 mg/dL — ABNORMAL HIGH (ref 70–99)
Potassium: 3.2 mmol/L — ABNORMAL LOW (ref 3.5–5.1)
Sodium: 141 mmol/L (ref 135–145)

## 2021-04-18 LAB — TROPONIN I (HIGH SENSITIVITY)
Troponin I (High Sensitivity): 4 ng/L (ref <18)
Troponin I (High Sensitivity): 4 ng/L (ref <18)

## 2021-04-18 LAB — I-STAT BETA HCG BLOOD, ED (MC, WL, AP ONLY): I-stat hCG, quantitative: <5 m[IU]/mL (ref <5)

## 2021-04-18 MED ORDER — IBUPROFEN 800 MG PO TABS
800.0000 mg | ORAL_TABLET | Freq: Once | ORAL | Status: AC
Start: 2021-04-18 — End: 2021-04-18
  Administered 2021-04-18: 800 mg via ORAL
  Filled 2021-04-18: qty 1

## 2021-04-18 MED ORDER — BENZONATATE 100 MG PO CAPS
100.0000 mg | ORAL_CAPSULE | Freq: Once | ORAL | Status: AC
  Administered 2021-04-18: 100 mg via ORAL
  Filled 2021-04-18: qty 1

## 2021-04-18 MED ORDER — IBUPROFEN 800 MG PO TABS: 800.0000 mg | ORAL_TABLET | Freq: Three times a day (TID) | ORAL | 0 refills | Status: DC

## 2021-04-18 MED ORDER — BENZONATATE 100 MG PO CAPS: 100.0000 mg | ORAL_CAPSULE | Freq: Two times a day (BID) | ORAL | 0 refills | Status: DC | PRN

## 2021-04-18 NOTE — ED Provider Notes (Signed)
?[facility] DEPT ?Provider Note ? ? ?CSN: [id number] ?Arrival date & time: 04/18/21  1520 ? ?  ? ?History ? ?Chief Complaint  ?Patient presents with  ? Chest Pain  ? ? ?Gabriela [name] is a 61 y.o. female history of hypertension, here presenting with chest pain.  Patient has right-sided chest pain radiated to her upper back and neck today.  Patient also has some sore throat and muscle aches as well.  Denies any COVID exposure.  Denies any fevers at home.  She states that she has some nonproductive cough as well.  She follows up with cardiology and had SVT in the past.  She denies any recent travel and does not have any history of cardiac stents. ? ?The history is provided by the patient.  ? ?  ? ?Home Medications ?Prior to Admission medications   ?Medication Sig Start Date End Date Taking? Authorizing Provider  ?atorvastatin (LIPITOR) 40 MG tablet Take 40 mg by mouth at bedtime. 12/30/19   [provider]  ?fluticasone (FLONASE) 50 MCG/ACT nasal spray Place 1 spray into both nostrils daily as needed for allergies. 08/19/19   [provider]  ?hydrOXYzine (ATARAX/VISTARIL) 10 MG tablet Take 1 tablet (10 mg total) by mouth 3 (three) times daily as needed for itching. 04/18/16   Veatrice Bourbon, MD  ?irbesartan-hydrochlorothiazide (AVALIDE) 150-12.5 MG tablet Take 1 tablet by mouth daily. 08/12/17   [provider]  ?metoprolol succinate (TOPROL-XL) 25 MG 24 hr tablet Take 0.5 tablets (12.5 mg total) by mouth 2 (two) times daily. Must keep upcoming appointment for continuation of refills 03/24/21 04/23/21  Lorretta Harp, MD  ?Multiple Vitamin (MULTIVITAMIN WITH MINERALS) TABS tablet Take 1 tablet by mouth daily.    [provider]  ?TURMERIC PO Take 1 tablet by mouth 2 (two) times daily.    [provider]  ?   ? ?Allergies    ?Patient has no known allergies.   ? ?Review of Systems   ?Review of Systems  ?Cardiovascular:  Positive for chest pain.   ?All other systems reviewed and are negative. ? ?Physical Exam ?Updated Vital Signs ?BP (!) 142/69   Pulse (!) 50   Temp 98.4 ?F (36.9 ?C) (Oral)   Resp 10   Ht '5\' 7"'$  (1.702 m)   Wt 95.3 kg   SpO2 100%   BMI 32.89 kg/m?  ?Physical Exam ?Vitals and nursing note reviewed.  ?Constitutional:   ?   Appearance: She is well-developed.  ?HENT:  ?   Head: Normocephalic.  ?Eyes:  ?   Extraocular Movements: Extraocular movements intact.  ?   Pupils: Pupils are equal, round, and reactive to light.  ?Neck:  ?   Comments: Posterior pharynx is clear ?Cardiovascular:  ?   Rate and Rhythm: Normal rate and regular rhythm.  ?   Heart sounds: Normal heart sounds.  ?Pulmonary:  ?   Effort: Pulmonary effort is normal.  ?   Breath sounds: Normal breath sounds.  ?Abdominal:  ?   General: Bowel sounds are normal.  ?   Palpations: Abdomen is soft.  ?Musculoskeletal:     ?   General: Normal range of motion.  ?   Cervical back: Normal range of motion and neck supple.  ?Skin: ?   General: Skin is warm.  ?   Capillary Refill: Capillary refill takes less than 2 seconds.  ?Neurological:  ?   General: No focal deficit present.  ?   Mental Status: She  is alert and oriented to person, place, and time.  ?Psychiatric:     ?   Mood and Affect: Mood normal.     ?   Behavior: Behavior normal.  ? ? ?ED Results / Procedures / Treatments   ?Labs ?(all labs ordered are listed, but only abnormal results are displayed) ?Labs Reviewed  ?BASIC METABOLIC PANEL - Abnormal; Notable for the following components:  ?    Result Value  ? Potassium 3.2 (*)   ? Glucose, Bld 119 (*)   ? All other components within normal limits  ?CBC  ?I-STAT BETA HCG BLOOD, ED ([facility], [facility], [facility] ONLY)  ?TROPONIN I (HIGH SENSITIVITY)  ?TROPONIN I (HIGH SENSITIVITY)  ? ? ?EKG ?EKG Interpretation ? ?Date/Time:  Tuesday April 18 2021 15:30:38 EDT ?Ventricular Rate:  71 ?PR Interval:  147 ?QRS Duration: 90 ?QT Interval:  394 ?QTC Calculation: 429 ?R Axis:   19 ?Text Interpretation: Sinus  rhythm Left atrial enlargement Baseline wander in lead(s) V3 No significant change since last tracing Confirmed by Wandra Arthurs [phone]) on 04/18/2021 5:29:54 PM ? ?Radiology ?DG Chest 2 View ? ?Result Date: 04/18/2021 ?CLINICAL DATA:  Chest pain EXAM: CHEST - 2 VIEW COMPARISON:  12/26/2019 FINDINGS: Cardiac and mediastinal contours are within normal limits. No focal pulmonary opacity. No pleural effusion or pneumothorax. No acute osseous abnormality. IMPRESSION: No acute cardiopulmonary process. Electronically Signed   By: Merilyn Baba M.D.   On: 04/18/2021 16:01   ? ?Procedures ?Procedures  ? ? ?Medications Ordered in ED ?Medications  ?ibuprofen (ADVIL) tablet 800 mg (800 mg Oral Given 04/18/21 1802)  ?benzonatate (TESSALON) capsule 100 mg (100 mg Oral Given 04/18/21 1802)  ? ? ?ED Course/ Medical Decision Making/ A&P ?  ?                        ?Medical Decision Making ?Gabriela [name] is a 61 y.o. female here presenting with chest pain and sore throat and cough.  I think likely viral syndrome.  Low suspicion for ACS. I doubt dissection or PE. Plan to get lab work and troponin x2 and chest x-ray. Patient denies any COVID or flu exposure and posterior pharynx is not red and have low suspicion for strep throat. ? ?7:22 PM ?White blood cell count is normal.  Potassium is slightly low at 3.2.  Patient had negative troponins x2.  At this point, patient is stable for discharge.  Likely viral syndrome ? ?Problems Addressed: ?Other chest pain: acute illness or injury ?Viral syndrome: acute illness or injury ? ?Amount and/or Complexity of Data Reviewed ?Labs: ordered. Decision-making details documented in ED Course. ?Radiology: ordered and independent interpretation performed. Decision-making details documented in ED Course. ?ECG/medicine tests: ordered and independent interpretation performed. Decision-making details documented in ED Course. ? ?Risk ?Prescription drug management. ? ?Final Clinical Impression(s) / ED  Diagnoses ?Final diagnoses:  ?None  ? ? ?Rx / DC Orders ?ED Discharge Orders   ? ? None  ? ?  ? ? ?  ?Drenda Freeze, MD ?04/18/21 1923 ? ?

## 2021-04-18 NOTE — Discharge Instructions (Signed)
Take Tylenol and Motrin for pain and muscle aches ? ?Take Tessalon Perles for cough ? ?See your doctor for follow-up ? ?Return to ER if you have worse chest pain, neck pain, trouble breathing ?

## 2021-04-18 NOTE — ED Provider Triage Note (Signed)
Emergency Medicine Provider Triage Evaluation Note ? ?Gabriela [name] , a 61 y.o. female  was evaluated in triage.  Pt complains of chest pain x last night. Pain began last night started at her neck and radiated to her back and now to her chest. Alleviated by ibuprofen thought it was her workers comp case but reports no improvement. Followed by Dr. Gwenlyn Found cardiology who sees her. Reports recent ECHO and stress test. No CAD. Family HX with mother MI at 35s ? ?Review of Systems  ?Positive: Chest pain, back pain ?Negative: Fever, cough, shortness of breath ? ?Physical Exam  ?BP (!) 172/75 (BP Location: Right Arm)   Pulse 73   Temp 98.4 ?F (36.9 ?C) (Oral)   Resp 16   Ht '5\' 7"'$  (1.702 m)   Wt 95.3 kg   SpO2 99%   BMI 32.89 kg/m?  ?Gen:   Awake, no distress   ?Resp:  Normal effort  ?MSK:   Moves extremities without difficulty  ?Other:   ? ?Medical Decision Making  ?Medically screening exam initiated at 4:02 PM.  Appropriate orders placed.  Gabriela [name] was informed that the remainder of the evaluation will be completed by another provider, this initial triage assessment does not replace that evaluation, and the importance of remaining in the ED until their evaluation is complete. ? ? ?  ?Janeece Fitting, PA-C ?04/18/21 1605 ? ?

## 2021-04-18 NOTE — ED Triage Notes (Signed)
Patient reports that she began having right chest pain. Patient states the pain started in her right mid back and then the right chest around 0900 today, ?Patient denies any SOB or nausea. ?

## 2021-04-21 ENCOUNTER — Other Ambulatory Visit: Payer: Self-pay | Admitting: Cardiovascular Disease

## 2021-04-26 DIAGNOSIS — R0789 Other chest pain: Secondary | ICD-10-CM | POA: Diagnosis not present

## 2021-05-01 ENCOUNTER — Ambulatory Visit: Payer: BC Managed Care – PPO

## 2021-05-01 ENCOUNTER — Ambulatory Visit
Admission: RE | Admit: 2021-05-01 | Discharge: 2021-05-01 | Disposition: A | Discharge status: Home / Self Care | Payer: BC Managed Care – PPO | Source: Ambulatory Visit | Attending: Internal Medicine | Admitting: Internal Medicine

## 2021-05-01 DIAGNOSIS — Z1231 Encounter for screening mammogram for malignant neoplasm of breast: Secondary | ICD-10-CM | POA: Diagnosis not present

## 2021-05-09 ENCOUNTER — Other Ambulatory Visit: Payer: Self-pay | Admitting: Cardiovascular Disease

## 2021-05-10 DIAGNOSIS — Z86010 Personal history of colonic polyps: Secondary | ICD-10-CM | POA: Diagnosis not present

## 2021-05-10 DIAGNOSIS — I10 Essential (primary) hypertension: Secondary | ICD-10-CM | POA: Diagnosis not present

## 2021-05-10 DIAGNOSIS — K5900 Constipation, unspecified: Secondary | ICD-10-CM | POA: Diagnosis not present

## 2021-05-16 ENCOUNTER — Ambulatory Visit: Payer: BC Managed Care – PPO | Admitting: Cardiovascular Disease

## 2021-05-16 DIAGNOSIS — K838 Other specified diseases of biliary tract: Secondary | ICD-10-CM | POA: Diagnosis not present

## 2021-05-16 DIAGNOSIS — K7689 Other specified diseases of liver: Secondary | ICD-10-CM | POA: Diagnosis not present

## 2021-05-16 DIAGNOSIS — K760 Fatty (change of) liver, not elsewhere classified: Secondary | ICD-10-CM | POA: Diagnosis not present

## 2021-06-01 ENCOUNTER — Telehealth: Payer: Self-pay | Admitting: *Deleted

## 2021-06-01 NOTE — Telephone Encounter (Signed)
Primary Cardiologist:Jonathan Gwenlyn Found, MD ? ?Chart reviewed as part of pre-operative protocol coverage. Because of Zykiria Bruening Murrill's past medical history and time since last visit, he/she will require a follow-up visit in order to better assess preoperative cardiovascular risk. ? ?Pre-op covering staff: ?- Please schedule appointment and call patient to inform them. ?- Please contact requesting surgeon's office via preferred method (i.e, phone, fax) to inform them of need for appointment prior to surgery. ? ?If applicable, this message will also be routed to pharmacy pool and/or primary cardiologist for input on holding anticoagulant/antiplatelet agent as requested below so that this information is available at time of patient's appointment.  ? ?Deberah Pelton, NP  ?06/01/2021, 4:51 PM  ? ?

## 2021-06-01 NOTE — Telephone Encounter (Signed)
CLEARANCE REQUEST CAME OVER THROUGH [facility] [employer]LECT[facility]ICALLY. ? ? ? ?Pre-operative Risk Assessment  ?  ?Patient Name: Gabriela [name]  ?DOB: 1960-06-28 ?MRN: [id number]  ? ?  ? ?Request for Surgical Clearance   ? ?Procedure:   COLONOSCOPY ? ?Date of Surgery:  Clearance 06/15/21                              ?   ?Surgeon:  DR. HUNG ?Surgeon's Group or Practice Name:  [facility] ?Phone number:  [phone] ?Fax number:  [phone] ?  ?Type of Clearance Requested:   ?- Medical  ?  ?Type of Anesthesia:   PROPOFOL  ?  ?Additional requests/questions:   ? ?Signed, ?Julaine Hua   ?06/01/2021, 4:43 PM  ? ?

## 2021-06-01 NOTE — Telephone Encounter (Signed)
Per pre op provider to obtain formal clearance request to be faxed to our office, fax # [phone] attn: pre op team. Once the clearance request has been received, our pre op team with review for clearance.  ?

## 2021-06-01 NOTE — Telephone Encounter (Signed)
Pt has appt 06/13/21 with Dr. Gwenlyn Found. Will send FYI to requesting office.  ?

## 2021-06-13 ENCOUNTER — Ambulatory Visit (INDEPENDENT_AMBULATORY_CARE_PROVIDER_SITE_OTHER): Payer: BC Managed Care – PPO | Admitting: Cardiovascular Disease

## 2021-06-13 ENCOUNTER — Encounter: Payer: Self-pay | Admitting: Cardiovascular Disease

## 2021-06-13 VITALS — BP 130/78 | HR 51 | Ht 67.0 in | Wt 210.4 lb

## 2021-06-13 DIAGNOSIS — I471 Supraventricular tachycardia, unspecified: Secondary | ICD-10-CM

## 2021-06-13 DIAGNOSIS — I10 Essential (primary) hypertension: Secondary | ICD-10-CM | POA: Diagnosis not present

## 2021-06-13 DIAGNOSIS — E782 Mixed hyperlipidemia: Secondary | ICD-10-CM | POA: Diagnosis not present

## 2021-06-13 NOTE — Assessment & Plan Note (Signed)
History of essential hypertension a blood pressure measured today at 130/78.  She is on Avalide, and metoprolol.

## 2021-06-13 NOTE — Assessment & Plan Note (Signed)
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  She had 2D echo that was normal and event monitoring that showed occasional PVCs, PACs and short runs of SVT.  She is on beta-blocker.  These are no longer a clinical issue.

## 2021-06-13 NOTE — Assessment & Plan Note (Signed)
History of hyperlipidemia on atorvastatin 40 mg a day with recent lipid profile performed 03/24/2020 revealing a total cholesterol 182, LDL of 106 and HDL of 69.  This is acceptable for primary prevention given her prior history of normal coronary arteries.

## 2021-06-13 NOTE — Patient Instructions (Signed)

## 2021-06-13 NOTE — Progress Notes (Signed)
06/13/2021 Gabriela [name]   [birth year]  [id number]  Primary Physician Velna Hatchet, MD Primary Cardiologist: Lorretta Harp MD FACP, [place], [place], [place]  HPI:  Gabriela [name] is a 61 y.o.  moderately overweight single [place] female mother of one child, grandmother and 2 grandchildren who I last saw in the 03/29/2020. Her cardiac risk factors include family history with a mother who died myocardial infarction at age 29, hypertension, hyperlipidemia and  continue tobacco abuse with 3 to 4 cigarettes a day.  She did have a heart catheterization which I performed 07/06/04 which wasn't normal. She does complain of occasional chest pain occurring every several months. These have not changed in frequency or severity.   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   She did have a negative GXT 09/19/2016.  Her most recent lipid profile performed 07/10/2019 revealed a total cholesterol of 197, LDL of 127 and HDL of 61.   She  obtained her second Covid shot and several days later she developed dizziness and tachypalpitations requiring evaluation by Dr. Vivi Martens in the [facility].  Her laboratory exam was unremarkable but her EKG did show runs of SVT which responded to low-dose IV beta-blockade.  She says she has "not felt herself" since her Covid vaccine.   She had a 2D echo which was fairly unremarkable revealing normal ejection fraction with grade 1 diastolic dysfunction.  An event monitor showed occasional PVCs, PACs and short runs of SVT.  She is on a low-dose beta-blocker.  Since I saw her a year ago she has remained clinically stable.  She no longer has palpitations.  She denies chest pain or shortness of breath.  Her lipid profile has improved.   Current Meds  Medication Sig   atorvastatin (LIPITOR) 40 MG tablet TAKE 1 TABLET BY MOUTH EVERY DAY   benzonatate (TESSALON) 100 MG capsule Take 1 capsule (100 mg  total) by mouth 2 (two) times daily as needed for cough.   fluticasone (FLONASE) 50 MCG/ACT nasal spray Place 1 spray into both nostrils daily as needed for allergies.   hydrOXYzine (ATARAX/VISTARIL) 10 MG tablet Take 1 tablet (10 mg total) by mouth 3 (three) times daily as needed for itching.   ibuprofen (ADVIL) 800 MG tablet Take 1 tablet (800 mg total) by mouth 3 (three) times daily.   irbesartan-hydrochlorothiazide (AVALIDE) 150-12.5 MG tablet Take 1 tablet by mouth daily.   metoprolol succinate (TOPROL-XL) 25 MG 24 hr tablet TAKE 1/2 BY MOUTH 2 TIMES DAILY *KEEP UPCOMING APPT FOR REFILLS*   Multiple Vitamin (MULTIVITAMIN WITH MINERALS) TABS tablet Take 1 tablet by mouth daily.   TURMERIC PO Take 1 tablet by mouth 2 (two) times daily.     No Known Allergies  Social History   Socioeconomic History   Marital status: Single    Spouse name: Not on file   Number of children: Not on file   Years of education: Not on file   Highest education level: Not on file  Occupational History   Occupation: Unemployed     Employer: [employer]  Tobacco Use   Smoking status: Former    Packs/day: 0.10    Years: 18.00    Pack years: 1.80    Types: Cigarettes    Quit date: 07/14/2015    Years since quitting: 5.9   Smokeless tobacco: Never   Tobacco comments:    only smokes  1 -2 cigarettes daily.  Vaping Use   Vaping Use: Never used  Substance and Sexual Activity   Alcohol use: Yes    Comment: Occas wine   Drug use: No   Sexual activity: Not Currently  Other Topics Concern   Not on file  Social History Narrative   Not on file   Social Determinants of Health   Financial Resource Strain: Not on file  Food Insecurity: Not on file  Transportation Needs: Not on file  Physical Activity: Not on file  Stress: Not on file  Social Connections: Not on file  Intimate Partner Violence: Not on file     Review of Systems: General: negative for chills, fever, night sweats or weight changes.   Cardiovascular: negative for chest pain, dyspnea on exertion, edema, orthopnea, palpitations, paroxysmal nocturnal dyspnea or shortness of breath Dermatological: negative for rash Respiratory: negative for cough or wheezing Urologic: negative for hematuria Abdominal: negative for nausea, vomiting, diarrhea, bright red blood per rectum, melena, or hematemesis Neurologic: negative for visual changes, syncope, or dizziness All other systems reviewed and are otherwise negative except as noted above.    Blood pressure 130/78, pulse (!) 51, height '5\' 7"'$  (1.702 m), weight 210 lb 6.4 oz (95.4 kg), SpO2 99 %.  General appearance: alert and no distress Neck: no adenopathy, no carotid bruit, no JVD, supple, symmetrical, trachea midline, and thyroid not enlarged, symmetric, no tenderness/mass/nodules Lungs: clear to auscultation bilaterally Heart: regular rate and rhythm, S1, S2 normal, no murmur, click, rub or gallop Extremities: extremities normal, atraumatic, no cyanosis or edema Pulses: 2+ and symmetric Skin: Skin color, texture, turgor normal. No rashes or lesions Neurologic: Grossly normal  EKG sinus bradycardia 51 without ST or T wave changes.  Personally reviewed this EKG.  ASSESSMENT AND PLAN:   HLD (hyperlipidemia) History of hyperlipidemia on atorvastatin 40 mg a day with recent lipid profile performed 03/24/2020 revealing a total cholesterol 182, LDL of 106 and HDL of 69.  This is acceptable for primary prevention given her prior history of normal coronary arteries.  Hypertension History of essential hypertension a blood pressure measured today at 130/78.  She is on Avalide, and metoprolol.  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  She had 2D echo that was normal and event monitoring that showed occasional PVCs, PACs and short runs of SVT.  She is on beta-blocker.  These are no longer a clinical  issue.     Lorretta Harp MD FACP,FACC,FAHA, [facility] 06/13/2021 3:36 PM

## 2021-06-14 NOTE — Telephone Encounter (Signed)
   Primary Cardiologist: Quay Burow, MD  Chart reviewed as part of pre-operative protocol coverage. Given past medical history and time since last visit, based on ACC/AHA guidelines, REIGAN TOLLIVER would be at acceptable risk for the planned procedure without further cardiovascular testing.   Patient was advised that if she develops new symptoms prior to surgery to contact our office to arrange a follow-up appointment.  He verbalized understanding.  I will route this recommendation to the requesting party via Epic fax function and remove from pre-op pool.  Please call with questions.  Jossie Ng. Kellin Fifer NP-C    06/14/2021, 8:49 AM [place] [address] Office [phone] Fax [phone]

## 2021-06-15 DIAGNOSIS — D125 Benign neoplasm of sigmoid colon: Secondary | ICD-10-CM | POA: Diagnosis not present

## 2021-06-15 DIAGNOSIS — Z1211 Encounter for screening for malignant neoplasm of colon: Secondary | ICD-10-CM | POA: Diagnosis not present

## 2021-06-15 DIAGNOSIS — D120 Benign neoplasm of cecum: Secondary | ICD-10-CM | POA: Diagnosis not present

## 2021-06-15 DIAGNOSIS — K5730 Diverticulosis of large intestine without perforation or abscess without bleeding: Secondary | ICD-10-CM | POA: Diagnosis not present

## 2021-06-15 DIAGNOSIS — K635 Polyp of colon: Secondary | ICD-10-CM | POA: Diagnosis not present

## 2021-07-04 ENCOUNTER — Other Ambulatory Visit: Payer: Self-pay | Admitting: Cardiovascular Disease

## 2021-08-15 DIAGNOSIS — I10 Essential (primary) hypertension: Secondary | ICD-10-CM | POA: Diagnosis not present

## 2021-08-16 DIAGNOSIS — K5900 Constipation, unspecified: Secondary | ICD-10-CM | POA: Diagnosis not present

## 2021-08-16 DIAGNOSIS — R1031 Right lower quadrant pain: Secondary | ICD-10-CM | POA: Diagnosis not present

## 2021-08-16 DIAGNOSIS — R140 Abdominal distension (gaseous): Secondary | ICD-10-CM | POA: Diagnosis not present

## 2021-08-16 DIAGNOSIS — R1032 Left lower quadrant pain: Secondary | ICD-10-CM | POA: Diagnosis not present

## 2021-08-22 DIAGNOSIS — Z1339 Encounter for screening examination for other mental health and behavioral disorders: Secondary | ICD-10-CM | POA: Diagnosis not present

## 2021-08-22 DIAGNOSIS — Z1331 Encounter for screening for depression: Secondary | ICD-10-CM | POA: Diagnosis not present

## 2021-08-22 DIAGNOSIS — M25552 Pain in left hip: Secondary | ICD-10-CM | POA: Diagnosis not present

## 2021-08-22 DIAGNOSIS — Z0000 Encounter for general adult medical examination without abnormal findings: Secondary | ICD-10-CM | POA: Diagnosis not present

## 2021-08-27 ENCOUNTER — Ambulatory Visit (HOSPITAL_COMMUNITY)
Admission: EM | Admit: 2021-08-27 | Discharge: 2021-08-27 | Disposition: A | Discharge status: Home / Self Care | Payer: BC Managed Care – PPO | Attending: Internal Medicine | Admitting: Internal Medicine

## 2021-08-27 ENCOUNTER — Encounter (HOSPITAL_COMMUNITY): Payer: Self-pay

## 2021-08-27 DIAGNOSIS — J3089 Other allergic rhinitis: Secondary | ICD-10-CM | POA: Diagnosis not present

## 2021-08-27 MED ORDER — FLUTICASONE PROPIONATE 50 MCG/ACT NA SUSP: 2.0000 | Freq: Every day | NASAL | 1 refills | Status: AC | PRN

## 2021-08-27 MED ORDER — GUAIFENESIN ER 1200 MG PO TB12: 1200.0000 mg | ORAL_TABLET | Freq: Two times a day (BID) | ORAL | 0 refills | Status: DC

## 2021-08-27 MED ORDER — CIPROFLOXACIN-DEXAMETHASONE 0.3-0.1 % OT SUSP: 4.0000 [drp] | Freq: Two times a day (BID) | OTIC | 0 refills | Status: DC

## 2021-08-27 MED ORDER — LORATADINE 10 MG PO TABS: 10.0000 mg | ORAL_TABLET | Freq: Every day | ORAL | 1 refills | Status: DC

## 2021-08-27 MED ORDER — BENZONATATE 100 MG PO CAPS: 100.0000 mg | ORAL_CAPSULE | Freq: Two times a day (BID) | ORAL | 0 refills | Status: DC | PRN

## 2021-08-27 NOTE — Discharge Instructions (Signed)
Take Claritin once daily for allergies to dry up nasal mucus and for itch. Spray 2 sprays of Flonase nasal spray into both of your nostrils once daily to reduce nasal inflammation and congestion. Take guaifenesin twice daily over the next few days to thin your mucus so that you are able to blow it out of your nose easier. Increase your water intake while taking guaifenesin so that it works best in your body and to stay well-hydrated. You may continue taking Tylenol as needed for headache.  If you develop any new or worsening symptoms or do not improve in the next 2 to 3 days, please return.  If your symptoms are severe, please go to the emergency room.  Follow-up with your primary care provider for further evaluation and management of your symptoms as well as ongoing wellness visits.  I hope you feel better!

## 2021-08-27 NOTE — ED Triage Notes (Signed)
Patient presents to Urgent Care with complaints of facial pain, left ear pressure, and runny nose x 1 week. Pt concerned with sinus infection. Taking tylenol for symptoms.

## 2021-08-27 NOTE — ED Provider Notes (Addendum)
[place]    CSN: [id number] Arrival date & time: 08/27/21  1126      History   Chief Complaint Chief Complaint  Patient presents with   Nasal Congestion   Otalgia    Left ear    Facial Pain    HPI Gabriela [name] is a 61 y.o. female.   Patient presents urgent care for evaluation of nasal congestion, mild intermittent headache, and dry cough for the last week.  Nasal congestion is sometimes green but mostly clear.  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  She suffers from seasonal allergies at baseline and uses Flonase intermittently with some relief but her symptoms come back when she is not using the Flonase nasal spray.  No known sick contacts reported.  No sore throat, shortness of breath, chest pain, fever/chills, eye drainage, eye itching, dizziness, urinary symptoms, abdominal pain, nausea, vomiting, constipation, diarrhea, or bloody sputum.  Cough is dry and worse at nighttime.  Reports feeling of left ear fullness ever since her symptoms started but has not attempted use of any over-the-counter medications prior to arrival urgent care for this.  She is able to hear out of her left ear although it is slightly decreased compared to her right ear.  No recent swimming or submerging her head underwater.  She has not noticed any drainage from her ears.  In the past, she has attempted use of Benadryl as well as another unknown allergy medicine for her symptoms but states that these make her drowsy.  She has tried using Claritin in the past with successful relief of symptoms and no drowsiness side effect but has not been taking this recently.  Patient has been using Tylenol over-the-counter intermittently for her headaches with significant relief of symptoms.  She is not currently suffering from a headache at this time.  No blurry vision or decreased visual acuity reported.  No other  triggering or relieving factors identified at this time per patient symptoms.   Otalgia   Past Medical History:  Diagnosis Date   Anemia    Arthritis    OA   Benign colon polyp    adenomatous   Chest pain    a. normal cors by cath in 2006 b. calcium score of 0 by Coronary CT in 11/2011   Dysmenorrhea    Fibroid    Hip pain    left hip   Hypertension    Irregular heart beat    Smoker    ONE PACK PER WEEK    Patient Active Problem List   Diagnosis Date Noted   PSVT (paroxysmal supraventricular tachycardia) ([place]) 12/30/2019   Primary osteoarthritis of hip 10/15/2017   Primary osteoarthritis of left hip 10/02/2017   Primary osteoarthritis of right knee 10/02/2017   Fatigue 05/05/2015   Constipation 05/05/2015   Tobacco abuse 10/12/2014   Insomnia 09/24/2014   Mastodynia, female 07/12/2014   Ankle edema 06/04/2014   Abdominal pain 02/02/2014   Chest pain 09/23/2013   RUQ pain 08/26/2013   Sprain of ankle, unspecified site 06/08/2013   Greater trochanteric bursitis of left hip 12/07/2011   Hot flashes, menopausal 08/09/2011   Dyspepsia 04/27/2011   Stress 04/17/2011   Ex-light cigarette smoker (1-9 per day)    Hypertension    Hx of adenomatous colonic polyps    Overweight(278.02) 02/28/2009   HLD (hyperlipidemia) 03/24/2008  DEPRESSION, MAJOR, MILD 11/13/2007    Past Surgical History:  Procedure Laterality Date   ABDOMINAL HYSTERECTOMY  06/2001   Toney Rakes, M.D. PARTIAL   CARDIAC CATHETERIZATION  2003   Dr Gwenlyn Found   CARDIOLITE MYOCARDIAL PERFUSION  06/07/03   NO PERFUSION ABNORMALITY. EF% 68%.   GYNECOLOGIC CRYOSURGERY  1993   DYSPLASIA   LOWER EXT ARTERIAL  07/28/08   NORMAL   TOTAL HIP ARTHROPLASTY Left 10/15/2017   Procedure: LEFT TOTAL HIP ARTHROPLASTY ANTERIOR APPROACH;  Surgeon: [name], MD;  Location: [facility];  Service: Orthopedics;  Laterality: Left;   TRANSTHORACIC ECHOCARDIOGRAM  07/03/04   TRACE MR,TRACE TR,TRACE PVR   UMBILICAL HERNIA  REPAIR     AS A CHILD    OB History     Gravida  3   Para  1   Term  0   Preterm  0   AB  2   Living  1      SAB  0   IAB  2   Ectopic  0   Multiple  0   Live Births               Home Medications    Prior to Admission medications   Medication Sig Start Date End Date Taking? Authorizing Provider  Guaifenesin 1200 MG TB12 Take 1 tablet (1,200 mg total) by mouth in the morning and at bedtime. 08/27/21  Yes Talbot Grumbling, FNP  loratadine (CLARITIN) 10 MG tablet Take 1 tablet (10 mg total) by mouth daily. 08/27/21  Yes Talbot Grumbling, FNP  atorvastatin (LIPITOR) 40 MG tablet TAKE 1 TABLET BY MOUTH EVERY DAY 05/11/21   Lorretta Harp, MD  benzonatate (TESSALON) 100 MG capsule Take 1 capsule (100 mg total) by mouth 2 (two) times daily as needed for cough. 08/27/21   Talbot Grumbling, FNP  fluticasone (FLONASE) 50 MCG/ACT nasal spray Place 2 sprays into both nostrils daily as needed for allergies. 08/27/21   Talbot Grumbling, FNP  hydrOXYzine (ATARAX/VISTARIL) 10 MG tablet Take 1 tablet (10 mg total) by mouth 3 (three) times daily as needed for itching. 04/18/16   Marina Goodell A, MD  ibuprofen (ADVIL) 800 MG tablet Take 1 tablet (800 mg total) by mouth 3 (three) times daily. 04/18/21   Drenda Freeze, MD  irbesartan-hydrochlorothiazide (AVALIDE) 150-12.5 MG tablet Take 1 tablet by mouth daily. 08/12/17   [provider]  metoprolol succinate (TOPROL-XL) 25 MG 24 hr tablet TAKE 1/2 BY MOUTH 2 TIMES DAILY *KEEP UPCOMING APPT FOR REFILLS* 07/04/21   Lorretta Harp, MD  Multiple Vitamin (MULTIVITAMIN WITH MINERALS) TABS tablet Take 1 tablet by mouth daily.    [provider]  TURMERIC PO Take 1 tablet by mouth 2 (two) times daily.    [provider]    Family History Family History  Problem Relation Age of Onset   Heart disease Mother    Hypertension Mother    Stroke Father    Colon cancer Neg Hx    Stomach cancer Neg  Hx     Social History Social History   Tobacco Use   Smoking status: Former    Packs/day: 0.10    Years: 18.00    Total pack years: 1.80    Types: Cigarettes    Quit date: 07/14/2015    Years since quitting: 6.1   Smokeless tobacco: Never   Tobacco comments:    only smokes 1 -2 cigarettes daily.  Vaping Use  Vaping Use: Never used  Substance Use Topics   Alcohol use: Yes    Comment: Occas wine   Drug use: No     Allergies   Patient has no known allergies.   Review of Systems Review of Systems  HENT:  Positive for ear pain.   Per HPI   Physical Exam Triage Vital Signs ED Triage Vitals  Enc Vitals Group     BP 08/27/21 1215 129/78     Pulse Rate 08/27/21 1215 63     Resp 08/27/21 1215 18     Temp 08/27/21 1215 98.2 F (36.8 C)     Temp Source 08/27/21 1215 Oral     SpO2 08/27/21 1215 96 %     Weight --      Height --      Head Circumference --      Peak Flow --      Pain Score 08/27/21 1214 8     Pain Loc --      Pain Edu? --      Excl. in [place]? --    No data found.  Updated Vital Signs BP 129/78 (BP Location: Left Arm)   Pulse 63   Temp 98.2 F (36.8 C) (Oral)   Resp 18   SpO2 96%   Visual Acuity Right Eye Distance:   Left Eye Distance:   Bilateral Distance:    Right Eye Near:   Left Eye Near:    Bilateral Near:     Physical Exam Vitals and nursing note reviewed.  Constitutional:      Appearance: Normal appearance. She is not ill-appearing or toxic-appearing.     Comments: Very pleasant patient sitting on exam in position of comfort table in no acute distress.   HENT:     Head: Normocephalic and atraumatic.     Right Ear: Hearing, tympanic membrane, ear canal and external ear normal.     Left Ear: Hearing, ear canal and external ear normal. A middle ear effusion is present.     Nose: Congestion and rhinorrhea present. Rhinorrhea is clear.     Right Turbinates: Swollen and pale.     Left Turbinates: Swollen and pale.     Mouth/Throat:      Lips: Pink.     Mouth: Mucous membranes are moist.     Pharynx: No posterior oropharyngeal erythema.  Eyes:     General: Lids are normal. Vision grossly intact. Gaze aligned appropriately.        Right eye: No discharge.        Left eye: No discharge.     Extraocular Movements: Extraocular movements intact.     Conjunctiva/sclera: Conjunctivae normal.     Pupils: Pupils are equal, round, and reactive to light.  Pulmonary:     Effort: Pulmonary effort is normal.     Comments: Clear to auscultation bilaterally. Abdominal:     General: Bowel sounds are normal.     Palpations: Abdomen is soft.     Tenderness: There is no abdominal tenderness. There is no right CVA tenderness, left CVA tenderness or guarding.  Musculoskeletal:     Cervical back: Neck supple.  Skin:    General: Skin is warm and dry.     Capillary Refill: Capillary refill takes less than 2 seconds.     Findings: No rash.  Neurological:     General: No focal deficit present.     Mental Status: She is alert and oriented to person, place, and time.  Mental status is at baseline.     Cranial Nerves: No dysarthria or facial asymmetry.     Motor: No weakness.     Gait: Gait is intact. Gait normal.     Comments: 5/5 power throughout.  No focal neurologic deficit identified to exam.  Psychiatric:        Mood and Affect: Mood normal.        Speech: Speech normal.        Behavior: Behavior normal.        Thought Content: Thought content normal.        Judgment: Judgment normal.      UC Treatments / Results  Labs (all labs ordered are listed, but only abnormal results are displayed) Labs Reviewed - No data to display  EKG   Radiology No results found.  Procedures Procedures (including critical care time)  Medications Ordered in UC Medications - No data to display  Initial Impression / Assessment and Plan / UC Course  I have reviewed the triage vital signs and the nursing notes.  Pertinent labs & imaging  results that were available during my care of the patient were reviewed by me and considered in my medical decision making (see chart for details).  1.  Nonseasonal allergic rhinitis Symptoms are most likely related to allergic rhinitis and aggravated by dust at work.  Advised patient to begin using 2 puffs of Flonase nasal spray into each nostril once a day consistently for best therapeutic effect.  She is to begin taking Claritin once daily to dry up mucus and provide symptomatic relief while at work.  She may use guaifenesin 1200 mg every 12 hours as needed while she is experiencing nasal congestion acutely that may be related to a possible viral upper respiratory infection.  Advised patient to increase water intake while taking this medicine so that it works best in her body.  Continue Tylenol intermittently as needed for headaches.  No focal neurologic deficit to physical exam today with stable neuro exam.  Return to urgent care as needed.  Patient may also take Tessalon Perles every 8 hours as needed for dry cough that is likely triggered by postnasal drainage.  Middle ear effusion found to physical exam of left ear.  Claritin will help to dry up some of the fluid behind the tympanic membrane causing decreased hearing to the left ear.  There is also some white debris to the left ear canal indicating possible beginnings of otitis externa, so we will treat with ciprodex ear drops 2 times daily for 7 days.  Patient agreeable with this plan.  Discussed physical exam and available lab work findings in clinic with patient.  Counseled patient regarding appropriate use of medications and potential side effects for all medications recommended or prescribed today. Discussed red flag signs and symptoms of worsening condition,when to call the PCP office, return to urgent care, and when to seek higher level of care in the emergency department. Patient verbalizes understanding and agreement with plan. All questions  answered. Patient discharged in stable condition.  Final Clinical Impressions(s) / UC Diagnoses   Final diagnoses:  Non-seasonal allergic rhinitis, unspecified trigger     Discharge Instructions      Take Claritin once daily for allergies to dry up nasal mucus and for itch. Spray 2 sprays of Flonase nasal spray into both of your nostrils once daily to reduce nasal inflammation and congestion. Take guaifenesin twice daily over the next few days to thin your mucus so  that you are able to blow it out of your nose easier. Increase your water intake while taking guaifenesin so that it works best in your body and to stay well-hydrated. You may continue taking Tylenol as needed for headache.  If you develop any new or worsening symptoms or do not improve in the next 2 to 3 days, please return.  If your symptoms are severe, please go to the emergency room.  Follow-up with your primary care provider for further evaluation and management of your symptoms as well as ongoing wellness visits.  I hope you feel better!     ED Prescriptions     Medication Sig Dispense Auth. Provider   benzonatate (TESSALON) 100 MG capsule  (Status: Discontinued) Take 1 capsule (100 mg total) by mouth 2 (two) times daily as needed for cough. 10 capsule Joella Prince M, FNP   fluticasone [facility]) 50 MCG/ACT nasal spray Place 2 sprays into both nostrils daily as needed for allergies. 16 g Joella Prince M, FNP   loratadine (CLARITIN) 10 MG tablet Take 1 tablet (10 mg total) by mouth daily. 30 tablet Joella Prince M, FNP   benzonatate (TESSALON) 100 MG capsule Take 1 capsule (100 mg total) by mouth 2 (two) times daily as needed for cough. 10 capsule Talbot Grumbling, FNP   Guaifenesin 1200 MG TB12 Take 1 tablet (1,200 mg total) by mouth in the morning and at bedtime. 14 tablet Talbot Grumbling, FNP      PDMP not reviewed this encounter.   Talbot Grumbling, FNP 08/27/21 Merlin, [place], FNP 08/27/21 1327    Talbot Grumbling, FNP 08/27/21 1339

## 2021-09-23 DIAGNOSIS — H10013 Acute follicular conjunctivitis, bilateral: Secondary | ICD-10-CM | POA: Diagnosis not present

## 2021-11-07 ENCOUNTER — Encounter: Payer: Self-pay | Admitting: Nurse Practitioner

## 2021-11-07 ENCOUNTER — Ambulatory Visit (INDEPENDENT_AMBULATORY_CARE_PROVIDER_SITE_OTHER): Payer: BC Managed Care – PPO | Admitting: Nurse Practitioner

## 2021-11-07 VITALS — BP 150/88 | HR 62 | Ht 66.75 in | Wt 210.0 lb

## 2021-11-07 DIAGNOSIS — N951 Menopausal and female climacteric states: Secondary | ICD-10-CM | POA: Diagnosis not present

## 2021-11-07 DIAGNOSIS — R1030 Lower abdominal pain, unspecified: Secondary | ICD-10-CM

## 2021-11-07 DIAGNOSIS — Z01419 Encounter for gynecological examination (general) (routine) without abnormal findings: Secondary | ICD-10-CM

## 2021-11-07 DIAGNOSIS — Z780 Asymptomatic menopausal state: Secondary | ICD-10-CM

## 2021-11-07 NOTE — Progress Notes (Signed)
Gabriela [name] 1960/06/11 [id number]   History:  61 y.o. [id number] presents for annual exam.  Postmenopausal - no HRT. Complains of hot flashes and night sweats. These are not new for her. She is interested in [place]. Does had mild fatty liver disease. Normal liver enzymes in April. Complains of lower abdominal pain and would like her ovaries checked. S/P 2003 TVH for fibroids. 1993 cryosurgery, subsequent paps normal. Picked up smoking again last year, smoking 1-2 cigarettes per day. HTN, HLD managed by PCP.   Gynecologic History No LMP recorded. Patient has had a hysterectomy.   Contraception/Family planning: status post hysterectomy Sexually active: No  Health Maintenance Last Pap: 10/20/2019. Results were: Normal Last mammogram: 05/01/2021. Results were: Normal Last colonoscopy: 2023. Results were: Polyps, 5-year recall Last Dexa: 10/21/2018. Results were: Normal, 5-year follow up  Past medical history, past surgical history, family history and social history were all reviewed and documented in the EPIC chart. Works at [employer]. Son and oldest grandson live locally. 69 yo grandson in [place].  ROS:  A ROS was performed and pertinent positives and negatives are included.  Exam:  Vitals:   11/07/21 1428  BP: (!) 150/88  Pulse: 62  Weight: 210 lb (95.3 kg)  Height: 5' 6.75" (1.695 m)     Body mass index is 33.14 kg/m.  General appearance:  Normal Thyroid:  Symmetrical, normal in size, without palpable masses or nodularity. Respiratory  Auscultation:  Clear without wheezing or rhonchi Cardiovascular  Auscultation:  Regular rate, without rubs, murmurs or gallops  Edema/varicosities:  Not grossly evident Abdominal  Soft,nontender, without masses, guarding or rebound.  Liver/spleen:  No organomegaly noted  Hernia:  None appreciated  Skin  Inspection:  Grossly normal   Breasts: Examined lying and sitting.   Right: Without masses, retractions, discharge or axillary  adenopathy.   Left: Without masses, retractions, discharge or axillary adenopathy. Genitourinary   Inguinal/mons:  Normal without inguinal adenopathy  External genitalia:  Normal appearing vulva with no masses, tenderness, or lesions  BUS/Urethra/Skene's glands:  Normal  Vagina:  Normal appearing with normal color and discharge, no lesions  Cervix:  Absent  Uterus:  Absent  Adnexa/parametria:     Rt: Normal in size, without masses or tenderness.   Lt: Normal in size, without masses or tenderness.  Anus and perineum: Normal  Digital rectal exam: Normal sphincter tone without palpated masses or tenderness  Patient informed chaperone available to be present for breast and pelvic exam. Patient has requested no chaperone to be present. Patient has been advised what will be completed during breast and pelvic exam.   Assessment/Plan:  61 y.o. [id number] for annual exam.   Well female exam with routine gynecological exam - Plan: Comprehensive metabolic panel. Education provided on SBEs, importance of preventative screenings, current guidelines, high calcium diet, regular exercise, and multivitamin daily.  Labs with PCP.   Postmenopausal - no HRT. S/P 2003 TVH for fibroids.   Vasomotor symptoms due to menopause - Complains of hot flashes and night sweats. Tried OTC supplements. Interested in [place]. Will check LFTs. Mild fatty liver.  Lower abdominal pain - Plan: US PELVIS TRANSVAGINAL NON-OB (TV ONLY). Has been on going for a while, intermittent, sharp and achy. Ultrasound recommended last year but she did not schedule. Would like to do now.   Screening for cervical cancer - Normal Pap history.  No longer screening per guidelines.   Screening for breast cancer - Stable benign right breast mass previously. Continue annual  screenings. Normal breast exam today.  Screening for colon cancer - 2023 colonoscopy. Will repeat at GI's recommended interval.   Screening for osteoporosis - Normal bone  density 2020. Will repeat at 5 years per recommendation.   Follow up in 1 year for annual.     Tamela Gammon [facility], 3:00 PM 11/07/2021

## 2021-11-08 ENCOUNTER — Other Ambulatory Visit: Payer: Self-pay

## 2021-11-08 ENCOUNTER — Other Ambulatory Visit: Payer: Self-pay | Admitting: Nurse Practitioner

## 2021-11-08 DIAGNOSIS — N951 Menopausal and female climacteric states: Secondary | ICD-10-CM

## 2021-11-08 LAB — COMPREHENSIVE METABOLIC PANEL
AG Ratio: 1.7 (calc) (ref 1.0–2.5)
ALT: 25 U/L (ref 6–29)
AST: 18 U/L (ref 10–35)
Albumin: 4.3 g/dL (ref 3.6–5.1)
Alkaline phosphatase (APISO): 119 U/L (ref 37–153)
BUN: 15 mg/dL (ref 7–25)
CO2: 29 mmol/L (ref 20–32)
Calcium: 10 mg/dL (ref 8.6–10.4)
Chloride: 104 mmol/L (ref 98–110)
Creat: 0.96 mg/dL (ref 0.50–1.05)
Globulin: 2.6 g/dL (calc) (ref 1.9–3.7)
Glucose, Bld: 93 mg/dL (ref 65–99)
Potassium: 4 mmol/L (ref 3.5–5.3)
Sodium: 142 mmol/L (ref 135–146)
Total Bilirubin: 0.5 mg/dL (ref 0.2–1.2)
Total Protein: 6.9 g/dL (ref 6.1–8.1)

## 2021-11-08 MED ORDER — VEOZAH 45 MG PO TABS: 1.0000 | ORAL_TABLET | Freq: Every day | ORAL | 2 refills | Status: DC

## 2021-11-27 DIAGNOSIS — K760 Fatty (change of) liver, not elsewhere classified: Secondary | ICD-10-CM | POA: Diagnosis not present

## 2021-11-27 DIAGNOSIS — I471 Supraventricular tachycardia, unspecified: Secondary | ICD-10-CM | POA: Diagnosis not present

## 2021-12-07 ENCOUNTER — Ambulatory Visit (INDEPENDENT_AMBULATORY_CARE_PROVIDER_SITE_OTHER): Payer: BC Managed Care – PPO | Admitting: Nurse Practitioner

## 2021-12-07 ENCOUNTER — Other Ambulatory Visit: Payer: BC Managed Care – PPO | Admitting: Nurse Practitioner

## 2021-12-07 ENCOUNTER — Ambulatory Visit (INDEPENDENT_AMBULATORY_CARE_PROVIDER_SITE_OTHER): Payer: BC Managed Care – PPO

## 2021-12-07 ENCOUNTER — Encounter: Payer: Self-pay | Admitting: Nurse Practitioner

## 2021-12-07 ENCOUNTER — Other Ambulatory Visit: Payer: BC Managed Care – PPO

## 2021-12-07 VITALS — BP 128/78 | HR 89 | Resp 18

## 2021-12-07 DIAGNOSIS — N941 Unspecified dyspareunia: Secondary | ICD-10-CM | POA: Diagnosis not present

## 2021-12-07 DIAGNOSIS — N952 Postmenopausal atrophic vaginitis: Secondary | ICD-10-CM | POA: Diagnosis not present

## 2021-12-07 DIAGNOSIS — R1030 Lower abdominal pain, unspecified: Secondary | ICD-10-CM | POA: Diagnosis not present

## 2021-12-07 IMAGING — US US ABDOMEN LIMITED
1 series · 14 of 25 positions shown · non-contrast
Comparison: CT Abdomen and Pelvis 05/11/2011.

CLINICAL DATA: 59-year-old female with intermittent right upper
quadrant abdominal pain for 2 weeks.

EXAM:
ULTRASOUND ABDOMEN LIMITED RIGHT UPPER QUADRANT

[Series 1: us abdomen limited ruq (liver/gb) · 14 of 52 slices shown]
[im 1/52]
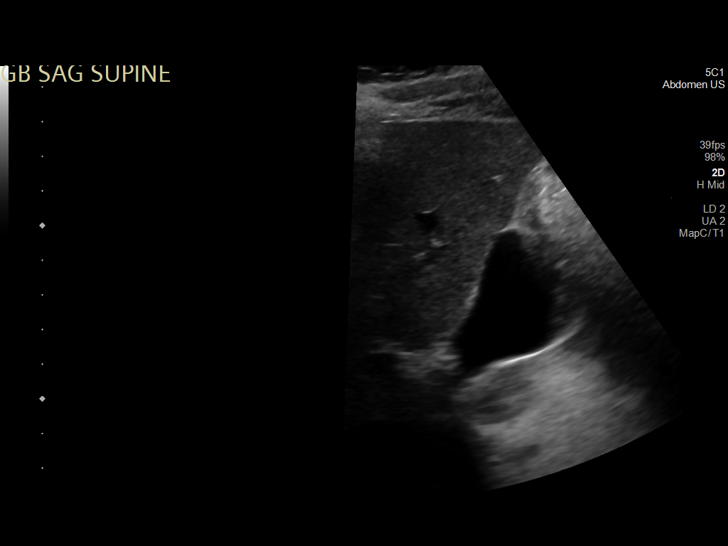
[im 5/52]
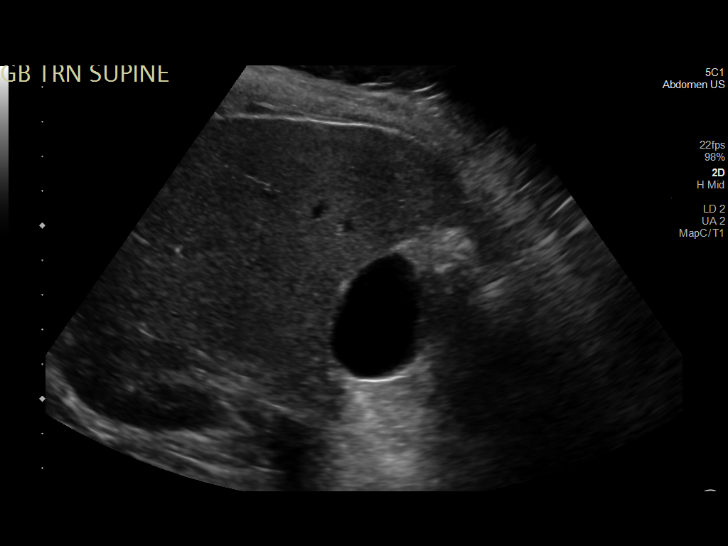
[im 9/52]
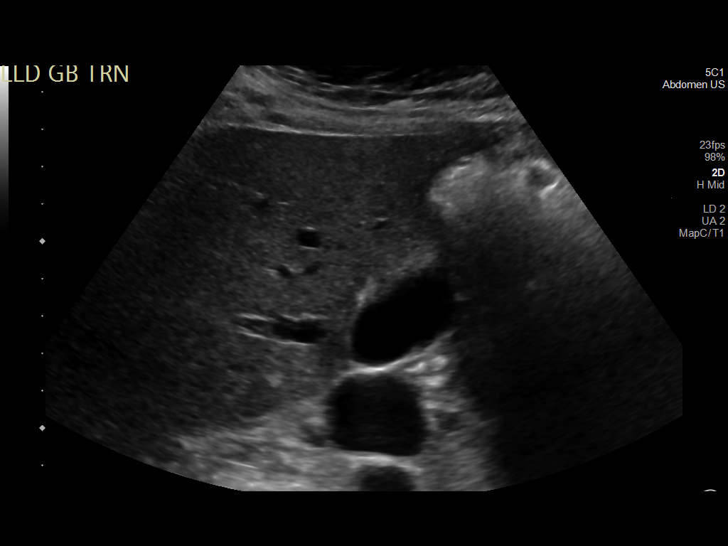
[im 13/52]
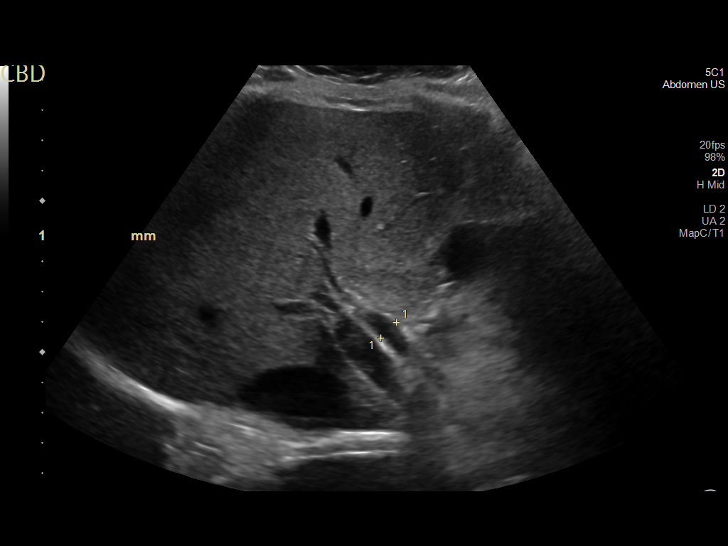
[im 18/52]
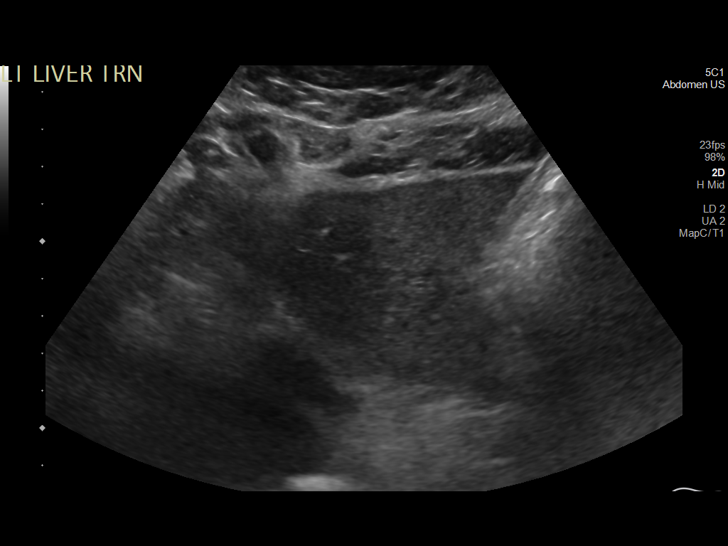
[im 20/52]
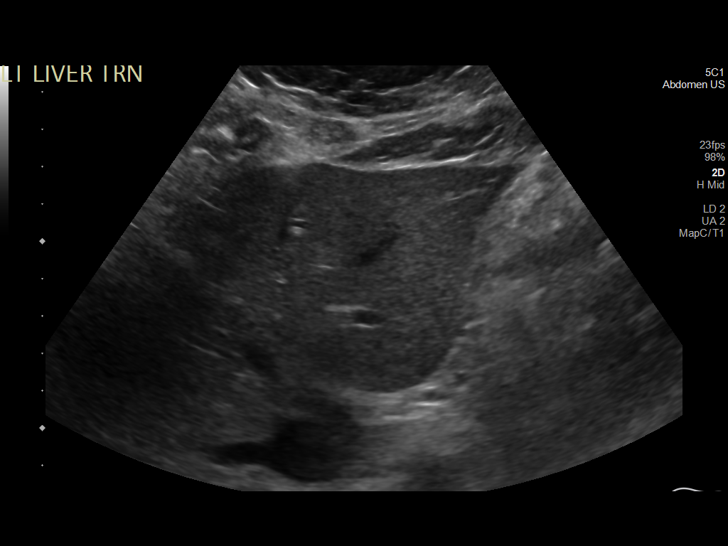
[im 24/52]
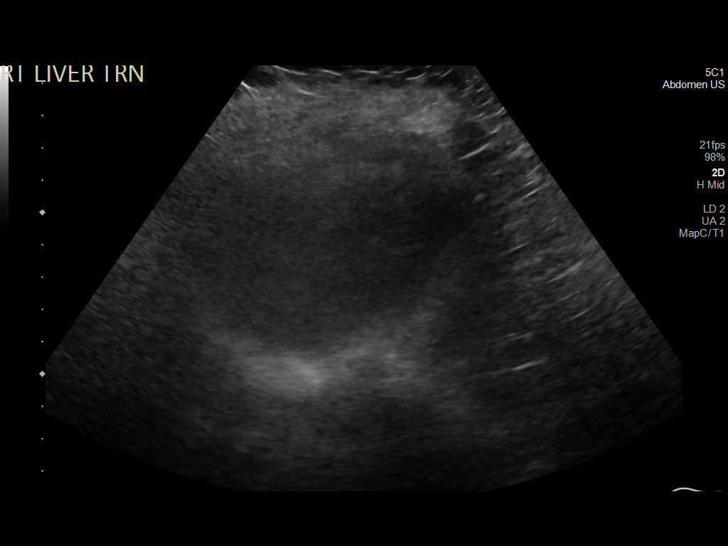
[im 28/52]
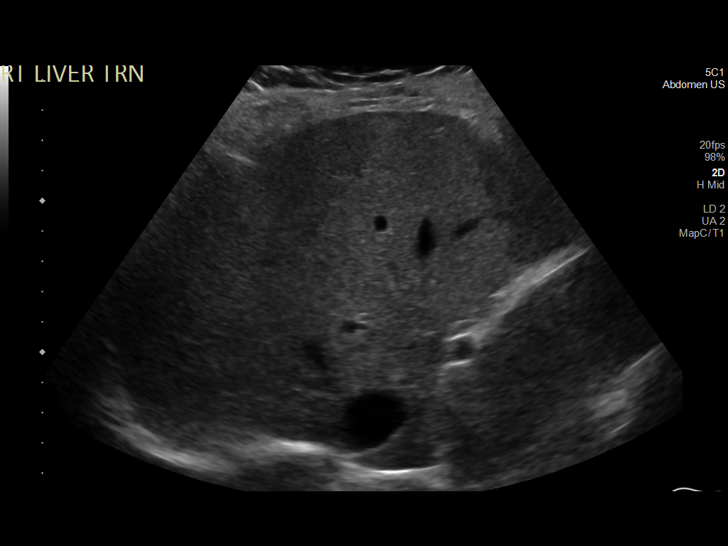
[im 32/52]
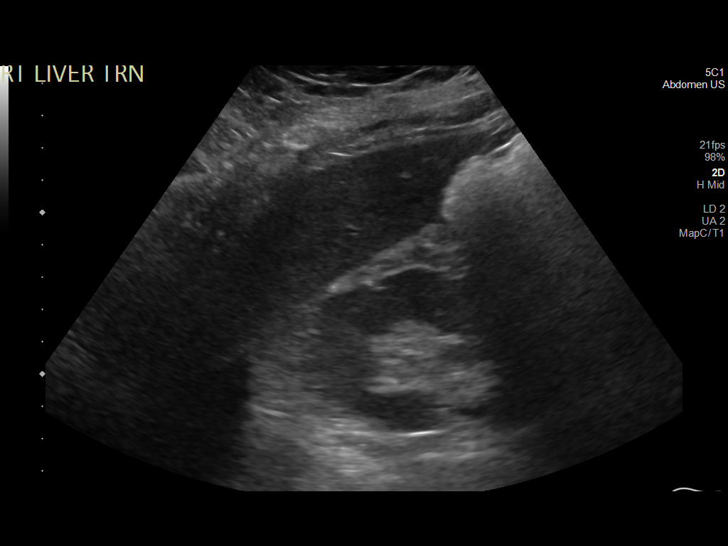
[im 35/52]
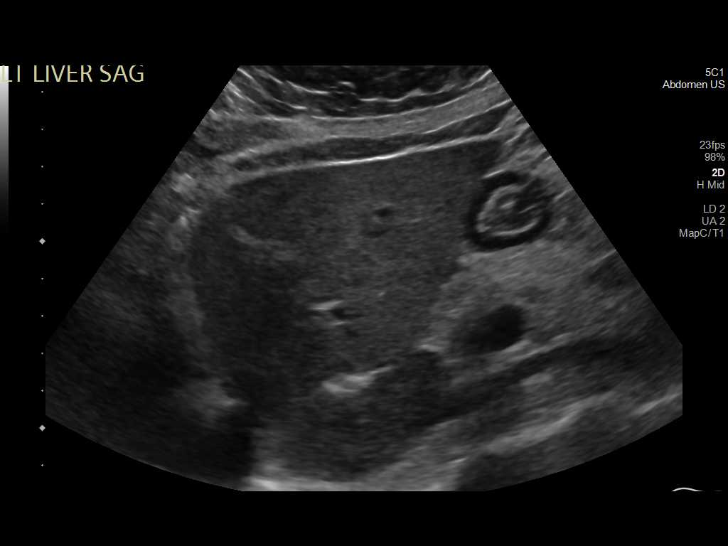
[im 39/52]
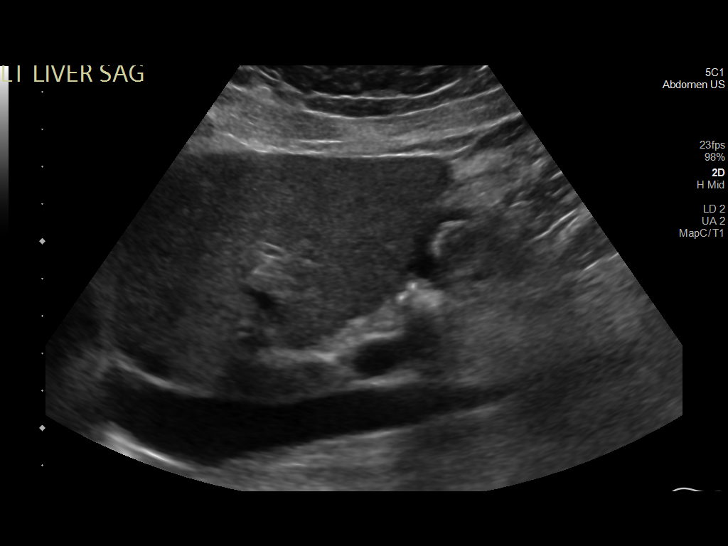
[im 43/52]
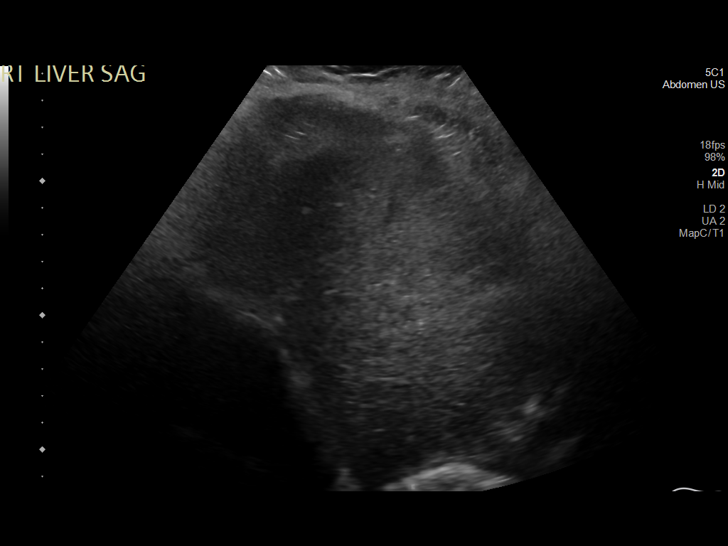
[im 47/52]
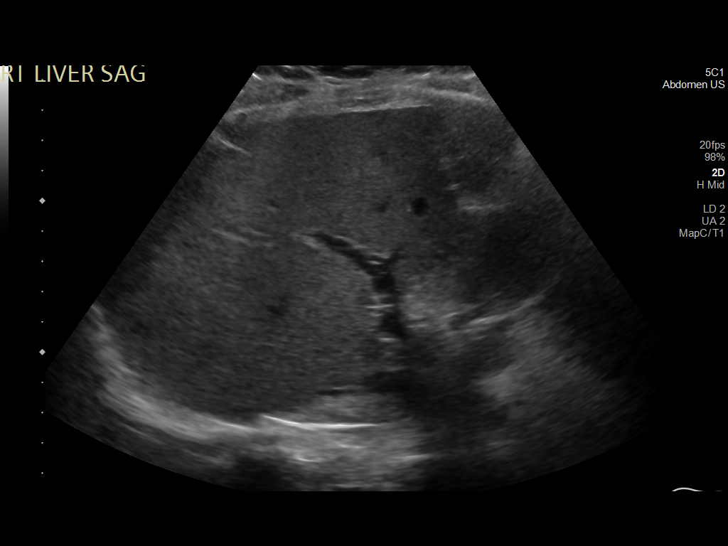
[im 52/52]
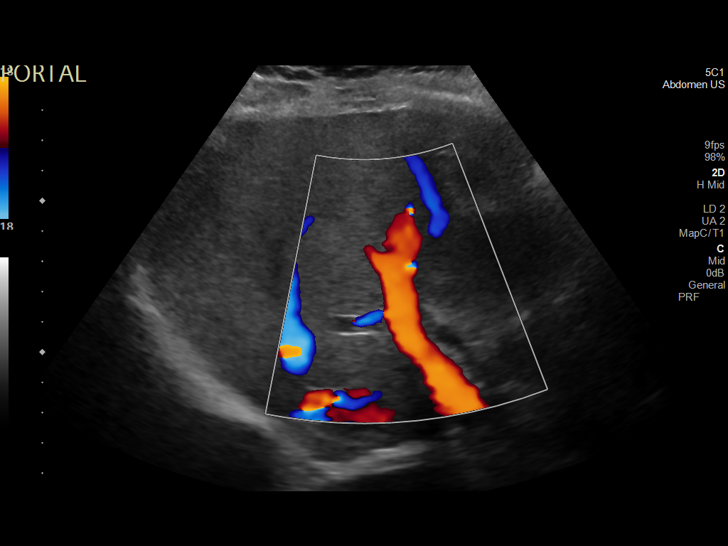

[14 of 25 positions shown; findings below may reference images not displayed]

FINDINGS: Gallbladder:

A part of the gallbladder fundus is obscured by bowel gas. But
otherwise no gallstones or wall thickening visualized. No
sonographic Murphy sign noted by sonographer.

Common bile duct:

Diameter: 7-9 mm, versus 6 mm in 9945.

Liver:

No intrahepatic biliary ductal dilatation is evident. Several small
chronic hepatic cysts are redemonstrated, the largest measuring 19
mm in the central liver is stable since 9945 with increased through
transmission, benign.

Background liver echogenicity appears normal. Portal vein is patent
on color Doppler imaging with normal direction of blood flow towards
the liver.

Other: Negative visible right kidney.
IMPRESSION: 1. The common bile duct is dilated up to 9 mm. Query
hyperbilirubinemia. But the obstructing etiology is unclear, with no
gallstones or evidence of cholecystitis identified.
2. Incidental small chronic benign hepatic cysts.

## 2021-12-07 MED ORDER — ESTRADIOL 0.1 MG/GM VA CREA: 1.0000 g | TOPICAL_CREAM | VAGINAL | 2 refills | Status: DC

## 2021-12-07 NOTE — Progress Notes (Deleted)
   Acute Office Visit  Subjective:    Patient ID: Gabriela [name], female    DOB: 11-Jul-1960, 61 y.o.   MRN: [id number]   HPI 61 y.o. presents today for ultrasound. Seen 11/07/2021 for annual exam with complaints of lower abdominal pain and wants her ovaries checked.  S/P 2003 TAH.    Review of Systems     Objective:    Physical Exam  There were no vitals taken for this visit. Wt Readings from Last 3 Encounters:  11/07/21 210 lb (95.3 kg)  06/13/21 210 lb 6.4 oz (95.4 kg)  04/18/21 210 lb (95.3 kg)        Patient informed chaperone available to be present for breast and/or pelvic exam. Patient has requested no chaperone to be present. Patient has been advised what will be completed during breast and pelvic exam.   Assessment & Plan:   Problem List Items Addressed This Visit   None       Tamela Gammon DNP, 11:04 AM 12/07/2021

## 2021-12-07 NOTE — Progress Notes (Signed)
   Acute Office Visit  Subjective:    Patient ID: Gabriela [name], female    DOB: October 31, 1960, 61 y.o.   MRN: [id number]   HPI 61 y.o. presents today for ultrasound. Seen 11/07/2021 for annual exam with complaints of lower abdominal pain and wants her ovaries checked.  S/P 2003 TAH. No pain today. Does have history of constipation. Complains of vaginal dryness and pain with intercourse. Has tried OTC lubricants with no improvement.   Review of Systems  Constitutional: Negative.   Gastrointestinal: Negative.   Genitourinary:  Positive for dyspareunia.       Objective:    Physical Exam Constitutional:      Appearance: Normal appearance.   GU: Not indicated  BP 128/78 (BP Location: Right Arm, Patient Position: Sitting)   Pulse 89   Resp 18   SpO2 98%  Wt Readings from Last 3 Encounters:  11/07/21 210 lb (95.3 kg)  06/13/21 210 lb 6.4 oz (95.4 kg)  04/18/21 210 lb (95.3 kg)        Assessment & Plan:   Problem List Items Addressed This Visit       Other   Abdominal pain   Other Visit Diagnoses     Vaginal atrophy    -  Primary   Relevant Medications   estradiol (ESTRACE VAGINAL) 0.1 MG/GM vaginal cream   Dyspareunia in female       Relevant Medications   estradiol (ESTRACE VAGINAL) 0.1 MG/GM vaginal cream      Vaginal and abdominal U/S: Uterus is surgically absent, vaginal cuff WNL. Neither ovary is positively identified. No pelvic masses or free fluid.   Plan: Reviewed ultrasound report. She feels discomfort is likely related to constipation. Will follow up with GI if needed. Vaginal estrogen twice weekly. Will use nightly x 2 weeks initially. Coconut oil for lubrication.      Tamela Gammon DNP, 4:06 PM 12/07/2021

## 2022-02-15 ENCOUNTER — Other Ambulatory Visit: Payer: Self-pay | Admitting: Family Medicine

## 2022-02-15 DIAGNOSIS — Z720 Tobacco use: Secondary | ICD-10-CM

## 2022-02-15 DIAGNOSIS — H9202 Otalgia, left ear: Secondary | ICD-10-CM | POA: Diagnosis not present

## 2022-02-15 DIAGNOSIS — R590 Localized enlarged lymph nodes: Secondary | ICD-10-CM

## 2022-02-15 DIAGNOSIS — J302 Other seasonal allergic rhinitis: Secondary | ICD-10-CM | POA: Diagnosis not present

## 2022-02-15 DIAGNOSIS — H6592 Unspecified nonsuppurative otitis media, left ear: Secondary | ICD-10-CM | POA: Diagnosis not present

## 2022-02-17 ENCOUNTER — Emergency Department (HOSPITAL_COMMUNITY)
Admission: EM | Admit: 2022-02-17 | Discharge: 2022-02-17 | Disposition: A | Discharge status: Home / Self Care | Payer: BC Managed Care – PPO | Attending: Emergency Medicine | Admitting: Emergency Medicine

## 2022-02-17 DIAGNOSIS — R591 Generalized enlarged lymph nodes: Secondary | ICD-10-CM

## 2022-02-17 DIAGNOSIS — R030 Elevated blood-pressure reading, without diagnosis of hypertension: Secondary | ICD-10-CM

## 2022-02-17 DIAGNOSIS — R590 Localized enlarged lymph nodes: Secondary | ICD-10-CM | POA: Insufficient documentation

## 2022-02-17 DIAGNOSIS — I10 Essential (primary) hypertension: Secondary | ICD-10-CM | POA: Diagnosis not present

## 2022-02-17 DIAGNOSIS — J329 Chronic sinusitis, unspecified: Secondary | ICD-10-CM | POA: Insufficient documentation

## 2022-02-17 MED ORDER — AMOXICILLIN-POT CLAVULANATE 875-125 MG PO TABS: 1.0000 | ORAL_TABLET | Freq: Two times a day (BID) | ORAL | 0 refills | Status: DC

## 2022-02-17 MED ORDER — METHOCARBAMOL 500 MG PO TABS: 500.0000 mg | ORAL_TABLET | Freq: Three times a day (TID) | ORAL | 0 refills | Status: DC | PRN

## 2022-02-17 NOTE — Discharge Instructions (Addendum)
Your exam today was overall reassuring.  Your symptoms are consistent with sinus infection.  I have sent antibiotic into the pharmacy for you to treat the sinus infection.  Your lymph node seem to be reactive to a local infection likely the sinus infection.  Continue to keep the appointment that your primary care doctor has referred you to for additional testing regarding the lymph nodes.  Follow-up with your primary care provider in about a week for reevaluation.  Any concerning symptoms please return to the emergency department.  Your blood pressure was elevated today in the emergency department.  Please keep a blood pressure diary and follow-up with your primary care provider to determine if you need to have any adjustments made to your blood pressure medication regimen.

## 2022-02-17 NOTE — ED Triage Notes (Signed)
Patient here with compliant of swollen lymph nodes in her neck that she first noticed several days ago. Patient states her PCP told her that if she has any problems go to the ED, patient states she started to experiencing some cramping around the lymph nodes yesterday. Patient is alert, oriented, speaking in complete sentences, and is in no apparent distress at this time.

## 2022-02-17 NOTE — ED Provider Triage Note (Signed)
Emergency Medicine Provider Triage Evaluation Note  Gabriela [name] , a 62 y.o. female  was evaluated in triage.  Pt complains of a painful lymph node left neck.  Pt saw her Md for last week   Review of Systems  Positive: Neck soreness Negative: fever  Physical Exam  BP (!) 175/92 (BP Location: Right Arm)   Pulse 72   Temp 98.1 F (36.7 C) (Oral)   Resp 16   SpO2 100%  Gen:   Awake, no distress   Resp:  Normal effort  MSK:   Moves extremities without difficulty  Other:    Medical Decision Making  Medically screening exam initiated at 1:59 PM.  Appropriate orders placed.  Gabriela [name] was informed that the remainder of the evaluation will be completed by another provider, this initial triage assessment does not replace that evaluation, and the importance of remaining in the ED until their evaluation is complete.     Fransico Meadow, [place] 02/17/22 1401

## 2022-02-17 NOTE — ED Provider Notes (Signed)
[facility] Provider Note   CSN: [id number] Arrival date & time: 02/17/22  1337     History  Chief Complaint  Patient presents with   Lymphadenopathy    Gabriela [name] is a 62 y.o. female.  62 year old female presents today for evaluation of left-sided lymphadenopathy.  This has been present for 3 days.  It is tender.  She has been seen by her PCP and states additional testing was ordered but was overall reassured by her PCP that this is likely benign.  She states today she had some discomfort on the left side particularly tightness on the left side of her neck.  Without difficulty in breathing, swallowing.  Denies fever.  Denies any night sweats, unintentional weight loss.  States she has had left ear pain/fullness for about a month.  Endorses sinus congestion as well as postnasal drip.  The history is provided by the patient. No language interpreter was used.       Home Medications Prior to Admission medications   Medication Sig Start Date End Date Taking? Authorizing Provider  amoxicillin-clavulanate (AUGMENTIN) 875-125 MG tablet Take 1 tablet by mouth every 12 (twelve) hours. 02/17/22  Yes Osama Coleson, PA-C  methocarbamol (ROBAXIN) 500 MG tablet Take 1 tablet (500 mg total) by mouth every 8 (eight) hours as needed for muscle spasms. 02/17/22  Yes Vikrant Pryce, PA-C  Ascorbic Acid (VITAMIN C PO) Take by mouth.    [provider]  atorvastatin (LIPITOR) 40 MG tablet TAKE 1 TABLET BY MOUTH EVERY DAY 05/11/21   Lorretta Harp, MD  Cyanocobalamin (VITAMIN B-12 PO) Take by mouth.    [provider]  estradiol (ESTRACE VAGINAL) 0.1 MG/GM vaginal cream Place 1 g vaginally 2 (two) times a week. Initial dose: Nightly x 2 weeks, then twice weekly at night 12/07/21   Marny Lowenstein A, NP  Fezolinetant (VEOZAH) 45 MG TABS Take 1 tablet by mouth daily. Patient not taking: Reported on 12/07/2021 11/08/21   Tamela Gammon, NP   fluticasone (FLONASE) 50 MCG/ACT nasal spray Place 2 sprays into both nostrils daily as needed for allergies. 08/27/21   Talbot Grumbling, FNP  hydrochlorothiazide (HYDRODIURIL) 25 MG tablet Take 25 mg by mouth every morning. 11/14/21   [provider]  hydrOXYzine (ATARAX/VISTARIL) 10 MG tablet Take 1 tablet (10 mg total) by mouth 3 (three) times daily as needed for itching. 04/18/16   Veatrice Bourbon, MD  loratadine (CLARITIN) 10 MG tablet Take 1 tablet (10 mg total) by mouth daily. 08/27/21   Talbot Grumbling, FNP  MAGNESIUM PO Take by mouth.    [provider]  metoprolol succinate (TOPROL-XL) 25 MG 24 hr tablet TAKE 1/2 BY MOUTH 2 TIMES DAILY *KEEP UPCOMING APPT FOR REFILLS* 07/04/21   Lorretta Harp, MD  Multiple Vitamin (MULTIVITAMIN WITH MINERALS) TABS tablet Take 1 tablet by mouth daily.    [provider]  TURMERIC PO Take 1 tablet by mouth 2 (two) times daily.    [provider]  VITAMIN D PO Take by mouth.    [provider]      Allergies    Patient has no known allergies.    Review of Systems   Review of Systems  Constitutional:  Negative for appetite change, chills, diaphoresis, fever and unexpected weight change.  HENT:  Positive for congestion and postnasal drip. Negative for sore throat, trouble swallowing and voice change.   Respiratory:  Negative for cough  and shortness of breath.   Gastrointestinal:  Negative for abdominal pain.  Skin:  Negative for rash.  Hematological:  Positive for adenopathy.  All other systems reviewed and are negative.   Physical Exam Updated Vital Signs BP (!) 175/92 (BP Location: Right Arm)   Pulse 72   Temp 98.1 F (36.7 C) (Oral)   Resp 16   SpO2 100%  Physical Exam Vitals and nursing note reviewed.  Constitutional:      General: She is not in acute distress.    Appearance: Normal appearance. She is not ill-appearing.  HENT:     Head: Normocephalic and atraumatic.     Right  Ear: Tympanic membrane, ear canal and external ear normal.     Left Ear: Tympanic membrane, ear canal and external ear normal.     Nose: Nose normal.  Eyes:     General: No scleral icterus.    Extraocular Movements: Extraocular movements intact.     Conjunctiva/sclera: Conjunctivae normal.  Cardiovascular:     Rate and Rhythm: Normal rate and regular rhythm.     Pulses: Normal pulses.  Pulmonary:     Effort: Pulmonary effort is normal. No respiratory distress.     Breath sounds: Normal breath sounds. No wheezing or rales.  Abdominal:     General: There is no distension.     Palpations: Abdomen is soft.     Tenderness: There is no abdominal tenderness. There is no right CVA tenderness, left CVA tenderness or guarding.  Musculoskeletal:        General: Normal range of motion.     Cervical back: Normal range of motion and neck supple. No rigidity.     Right lower leg: No edema.     Left lower leg: No edema.  Lymphadenopathy:     Cervical: Cervical adenopathy (left anterior cervical chain. tender to palpation) present.  Skin:    General: Skin is warm and dry.  Neurological:     General: No focal deficit present.     Mental Status: She is alert and oriented to person, place, and time. Mental status is at baseline.     ED Results / Procedures / Treatments   Labs (all labs ordered are listed, but only abnormal results are displayed) Labs Reviewed - No data to display  EKG None  Radiology No results found.  Procedures Procedures    Medications Ordered in ED Medications - No data to display  ED Course/ Medical Decision Making/ A&P                             Medical Decision Making Risk Prescription drug management.   63 year old female presents today for evaluation of left cervical anterior chain lymphadenopathy.  Present for 3 days.  It is tender.  No other local lymphadenopathy appreciated such as preauricular, postauricular, contralateral cervical chain  lymphadenopathy, clavicular.  Without B symptoms.  Afebrile.  Vital signs stable with the exception of elevated blood pressure.  Discussed importance of blood pressure diary and follow-up with PCP.  Given symptoms consistent with sinusitis this is likely reactive lymphadenopathy.  Discussed that we will start patient on Augmentin.  Patient will need to follow-up with her PCP.  Discussed keeping her appointments for ultrasound that was ordered by PCP.  Patient voices understanding and is in agreement with plan.  Muscle relaxer given at patient's request given the tightness she is experiencing on the left side.  Patient is appropriate  for discharge.  Discharged in stable condition.  Final Clinical Impression(s) / ED Diagnoses Final diagnoses:  Lymphadenopathy  Sinusitis, unspecified chronicity, unspecified location  Elevated blood pressure reading    Rx / DC Orders ED Discharge Orders          Ordered    amoxicillin-clavulanate (AUGMENTIN) 875-125 MG tablet  Every 12 hours        02/17/22 1616    methocarbamol (ROBAXIN) 500 MG tablet  Every 8 hours PRN        02/17/22 1619              Evlyn Courier, PA-C 02/17/22 1630    Dorie Rank, MD 02/17/22 2343

## 2022-02-28 ENCOUNTER — Ambulatory Visit
Admission: RE | Admit: 2022-02-28 | Discharge: 2022-02-28 | Disposition: A | Discharge status: Home / Self Care | Payer: BC Managed Care – PPO | Source: Ambulatory Visit | Attending: Family Medicine | Admitting: Family Medicine

## 2022-02-28 DIAGNOSIS — E049 Nontoxic goiter, unspecified: Secondary | ICD-10-CM | POA: Diagnosis not present

## 2022-02-28 DIAGNOSIS — R590 Localized enlarged lymph nodes: Secondary | ICD-10-CM

## 2022-03-05 ENCOUNTER — Other Ambulatory Visit: Payer: Self-pay | Admitting: Internal Medicine

## 2022-03-05 DIAGNOSIS — E049 Nontoxic goiter, unspecified: Secondary | ICD-10-CM

## 2022-03-09 DIAGNOSIS — L501 Idiopathic urticaria: Secondary | ICD-10-CM | POA: Diagnosis not present

## 2022-03-09 DIAGNOSIS — J3089 Other allergic rhinitis: Secondary | ICD-10-CM | POA: Diagnosis not present

## 2022-03-15 ENCOUNTER — Ambulatory Visit
Admission: RE | Admit: 2022-03-15 | Discharge: 2022-03-15 | Disposition: A | Discharge status: Home / Self Care | Payer: BC Managed Care – PPO | Source: Ambulatory Visit | Attending: Family Medicine | Admitting: Family Medicine

## 2022-03-15 DIAGNOSIS — Z720 Tobacco use: Secondary | ICD-10-CM

## 2022-03-29 ENCOUNTER — Ambulatory Visit
Admission: RE | Admit: 2022-03-29 | Discharge: 2022-03-29 | Disposition: A | Discharge status: Home / Self Care | Payer: BC Managed Care – PPO | Source: Ambulatory Visit | Attending: Internal Medicine | Admitting: Internal Medicine

## 2022-03-29 ENCOUNTER — Other Ambulatory Visit: Payer: Self-pay | Admitting: Internal Medicine

## 2022-03-29 DIAGNOSIS — E049 Nontoxic goiter, unspecified: Secondary | ICD-10-CM

## 2022-03-29 DIAGNOSIS — E041 Nontoxic single thyroid nodule: Secondary | ICD-10-CM | POA: Diagnosis not present

## 2022-03-29 DIAGNOSIS — Z1231 Encounter for screening mammogram for malignant neoplasm of breast: Secondary | ICD-10-CM

## 2022-04-03 ENCOUNTER — Other Ambulatory Visit: Payer: Self-pay | Admitting: Internal Medicine

## 2022-04-03 DIAGNOSIS — E041 Nontoxic single thyroid nodule: Secondary | ICD-10-CM

## 2022-04-03 DIAGNOSIS — E785 Hyperlipidemia, unspecified: Secondary | ICD-10-CM | POA: Diagnosis not present

## 2022-04-20 ENCOUNTER — Other Ambulatory Visit (HOSPITAL_COMMUNITY)
Admission: RE | Admit: 2022-04-20 | Discharge: 2022-04-20 | Disposition: A | Discharge status: Home / Self Care | Payer: BC Managed Care – PPO | Source: Ambulatory Visit | Attending: Interventional Radiology | Admitting: Interventional Radiology

## 2022-04-20 ENCOUNTER — Ambulatory Visit
Admission: RE | Admit: 2022-04-20 | Discharge: 2022-04-20 | Disposition: A | Discharge status: Home / Self Care | Payer: BC Managed Care – PPO | Source: Ambulatory Visit | Attending: Internal Medicine | Admitting: Internal Medicine

## 2022-04-20 DIAGNOSIS — E0789 Other specified disorders of thyroid: Secondary | ICD-10-CM | POA: Diagnosis not present

## 2022-04-20 DIAGNOSIS — E041 Nontoxic single thyroid nodule: Secondary | ICD-10-CM

## 2022-04-24 LAB — CYTOLOGY - NON PAP

## 2022-04-25 DIAGNOSIS — J069 Acute upper respiratory infection, unspecified: Secondary | ICD-10-CM | POA: Diagnosis not present

## 2022-04-25 DIAGNOSIS — Z1152 Encounter for screening for COVID-19: Secondary | ICD-10-CM | POA: Diagnosis not present

## 2022-04-25 DIAGNOSIS — J309 Allergic rhinitis, unspecified: Secondary | ICD-10-CM | POA: Diagnosis not present

## 2022-04-25 DIAGNOSIS — R059 Cough, unspecified: Secondary | ICD-10-CM | POA: Diagnosis not present

## 2022-04-25 DIAGNOSIS — R5383 Other fatigue: Secondary | ICD-10-CM | POA: Diagnosis not present

## 2022-04-25 DIAGNOSIS — J3489 Other specified disorders of nose and nasal sinuses: Secondary | ICD-10-CM | POA: Diagnosis not present

## 2022-04-26 DIAGNOSIS — E042 Nontoxic multinodular goiter: Secondary | ICD-10-CM | POA: Diagnosis not present

## 2022-05-07 ENCOUNTER — Encounter (HOSPITAL_COMMUNITY): Payer: Self-pay

## 2022-05-10 ENCOUNTER — Ambulatory Visit
Admission: RE | Admit: 2022-05-10 | Discharge: 2022-05-10 | Disposition: A | Discharge status: Home / Self Care | Payer: BC Managed Care – PPO | Source: Ambulatory Visit | Attending: Internal Medicine | Admitting: Internal Medicine

## 2022-05-10 DIAGNOSIS — Z1231 Encounter for screening mammogram for malignant neoplasm of breast: Secondary | ICD-10-CM | POA: Diagnosis not present

## 2022-05-12 ENCOUNTER — Other Ambulatory Visit: Payer: Self-pay | Admitting: Cardiovascular Disease

## 2022-05-14 DIAGNOSIS — H903 Sensorineural hearing loss, bilateral: Secondary | ICD-10-CM | POA: Diagnosis not present

## 2022-05-14 DIAGNOSIS — E041 Nontoxic single thyroid nodule: Secondary | ICD-10-CM | POA: Diagnosis not present

## 2022-05-14 DIAGNOSIS — R09A2 Foreign body sensation, throat: Secondary | ICD-10-CM | POA: Diagnosis not present

## 2022-05-14 NOTE — Telephone Encounter (Signed)
Rx request sent to pharmacy.  

## 2022-05-15 ENCOUNTER — Other Ambulatory Visit: Payer: Self-pay | Admitting: Internal Medicine

## 2022-05-15 DIAGNOSIS — R928 Other abnormal and inconclusive findings on diagnostic imaging of breast: Secondary | ICD-10-CM

## 2022-05-18 ENCOUNTER — Other Ambulatory Visit: Payer: Self-pay | Admitting: Otolaryngology

## 2022-05-18 DIAGNOSIS — I10 Essential (primary) hypertension: Secondary | ICD-10-CM | POA: Diagnosis not present

## 2022-05-18 DIAGNOSIS — K7689 Other specified diseases of liver: Secondary | ICD-10-CM | POA: Diagnosis not present

## 2022-05-18 DIAGNOSIS — E041 Nontoxic single thyroid nodule: Secondary | ICD-10-CM

## 2022-05-18 DIAGNOSIS — K760 Fatty (change of) liver, not elsewhere classified: Secondary | ICD-10-CM | POA: Diagnosis not present

## 2022-05-21 ENCOUNTER — Other Ambulatory Visit: Payer: Self-pay | Admitting: Nurse Practitioner

## 2022-05-21 DIAGNOSIS — K7689 Other specified diseases of liver: Secondary | ICD-10-CM

## 2022-05-21 DIAGNOSIS — K760 Fatty (change of) liver, not elsewhere classified: Secondary | ICD-10-CM

## 2022-06-02 ENCOUNTER — Other Ambulatory Visit: Payer: BC Managed Care – PPO

## 2022-06-04 ENCOUNTER — Other Ambulatory Visit: Payer: BC Managed Care – PPO

## 2022-06-05 ENCOUNTER — Ambulatory Visit: Payer: BC Managed Care – PPO

## 2022-06-05 ENCOUNTER — Ambulatory Visit
Admission: RE | Admit: 2022-06-05 | Discharge: 2022-06-05 | Disposition: A | Discharge status: Home / Self Care | Payer: BC Managed Care – PPO | Source: Ambulatory Visit | Attending: Internal Medicine | Admitting: Internal Medicine

## 2022-06-05 ENCOUNTER — Ambulatory Visit
Admission: RE | Admit: 2022-06-05 | Discharge: 2022-06-05 | Disposition: A | Discharge status: Home / Self Care | Payer: BC Managed Care – PPO | Source: Ambulatory Visit | Attending: Otolaryngology | Admitting: Otolaryngology

## 2022-06-05 ENCOUNTER — Ambulatory Visit
Admission: RE | Admit: 2022-06-05 | Discharge: 2022-06-05 | Disposition: A | Discharge status: Home / Self Care | Payer: BC Managed Care – PPO | Source: Ambulatory Visit | Attending: Nurse Practitioner | Admitting: Nurse Practitioner

## 2022-06-05 DIAGNOSIS — K7689 Other specified diseases of liver: Secondary | ICD-10-CM

## 2022-06-05 DIAGNOSIS — K760 Fatty (change of) liver, not elsewhere classified: Secondary | ICD-10-CM | POA: Diagnosis not present

## 2022-06-05 DIAGNOSIS — R928 Other abnormal and inconclusive findings on diagnostic imaging of breast: Secondary | ICD-10-CM | POA: Diagnosis not present

## 2022-06-05 DIAGNOSIS — E041 Nontoxic single thyroid nodule: Secondary | ICD-10-CM

## 2022-06-25 DIAGNOSIS — L918 Other hypertrophic disorders of the skin: Secondary | ICD-10-CM | POA: Diagnosis not present

## 2022-06-25 DIAGNOSIS — L814 Other melanin hyperpigmentation: Secondary | ICD-10-CM | POA: Diagnosis not present

## 2022-06-25 DIAGNOSIS — L821 Other seborrheic keratosis: Secondary | ICD-10-CM | POA: Diagnosis not present

## 2022-06-25 DIAGNOSIS — D225 Melanocytic nevi of trunk: Secondary | ICD-10-CM | POA: Diagnosis not present

## 2022-08-15 ENCOUNTER — Other Ambulatory Visit: Payer: Self-pay | Admitting: Cardiovascular Disease

## 2022-08-20 DIAGNOSIS — R635 Abnormal weight gain: Secondary | ICD-10-CM | POA: Diagnosis not present

## 2022-08-20 DIAGNOSIS — R140 Abdominal distension (gaseous): Secondary | ICD-10-CM | POA: Diagnosis not present

## 2022-08-20 DIAGNOSIS — K5900 Constipation, unspecified: Secondary | ICD-10-CM | POA: Diagnosis not present

## 2022-09-05 DIAGNOSIS — E785 Hyperlipidemia, unspecified: Secondary | ICD-10-CM | POA: Diagnosis not present

## 2022-09-05 DIAGNOSIS — E041 Nontoxic single thyroid nodule: Secondary | ICD-10-CM | POA: Diagnosis not present

## 2022-09-05 DIAGNOSIS — R7302 Impaired glucose tolerance (oral): Secondary | ICD-10-CM | POA: Diagnosis not present

## 2022-09-06 ENCOUNTER — Other Ambulatory Visit: Payer: Self-pay | Admitting: Cardiovascular Disease

## 2022-09-14 ENCOUNTER — Encounter: Payer: Self-pay | Admitting: Cardiovascular Disease

## 2022-09-14 ENCOUNTER — Ambulatory Visit: Payer: BC Managed Care – PPO | Attending: Cardiovascular Disease | Admitting: Cardiovascular Disease

## 2022-09-14 VITALS — BP 134/78 | HR 62 | Ht 67.0 in | Wt 225.2 lb

## 2022-09-14 DIAGNOSIS — I471 Supraventricular tachycardia, unspecified: Secondary | ICD-10-CM

## 2022-09-14 DIAGNOSIS — E663 Overweight: Secondary | ICD-10-CM | POA: Diagnosis not present

## 2022-09-14 DIAGNOSIS — I10 Essential (primary) hypertension: Secondary | ICD-10-CM

## 2022-09-14 DIAGNOSIS — E782 Mixed hyperlipidemia: Secondary | ICD-10-CM | POA: Diagnosis not present

## 2022-09-14 DIAGNOSIS — Z87891 Personal history of nicotine dependence: Secondary | ICD-10-CM

## 2022-09-14 MED ORDER — ATORVASTATIN CALCIUM 80 MG PO TABS: 80.0000 mg | ORAL_TABLET | Freq: Every day | ORAL | 3 refills | Status: DC

## 2022-09-14 NOTE — Assessment & Plan Note (Signed)
History of ongoing tobacco abuse with only 1 cigarette a day.

## 2022-09-14 NOTE — Assessment & Plan Note (Signed)
History of moderate obesity with a BMI of 35.  She has gained 15 pounds since I saw her a year ago.  I am going to refer her to the [facility] diet wellness center for physician assisted weight loss.

## 2022-09-14 NOTE — Assessment & Plan Note (Signed)
History of hyperlipidemia on atorvastatin 40 mg a day with lipid profile performed 09/05/2022 revealing a total cholesterol 149, LDL of 84 and HDL of 56, acceptable for primary prevention.

## 2022-09-14 NOTE — Assessment & Plan Note (Signed)
History of essential hypertension blood pressure measured today at 134/78.  She is on hydrochlorothiazide and metoprolol.

## 2022-09-14 NOTE — Progress Notes (Signed)
09/14/2022 ASTI FAVREAU   03-02-1960  [id number]  Primary Physician Alysia Penna, MD Primary Cardiologist: Runell Gess MD FACP, [place], [place], [place]  HPI:  Gabriela [name] is a 62 y.o.  moderately overweight single [place] American female mother of one child, grandmother and 2 grandchildren who I last saw in the 06/13/2021. Her cardiac risk factors include family history with a mother who died myocardial infarction at age 40, hypertension, hyperlipidemia and  continue tobacco abuse with 3 to 4 cigarettes a day.  She did have a heart catheterization which I performed 07/06/04 which wasn't normal. She does complain of occasional chest pain occurring every several months. These have not changed in frequency or severity.   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   She did have a negative GXT 09/19/2016.  Her most recent lipid profile performed 07/10/2019 revealed a total cholesterol of 197, LDL of 127 and HDL of 61.   She  obtained her second Covid shot and several days later she developed dizziness and tachypalpitations requiring evaluation by Dr. Bruce Donath in the ER [facility].  Her laboratory exam was unremarkable but her EKG did show runs of SVT which responded to low-dose IV beta-blockade.  She says she has "not felt herself" since her Covid vaccine.   She had a 2D echo which was fairly unremarkable revealing normal ejection fraction with grade 1 diastolic dysfunction.  An event monitor showed occasional PVCs, PACs and short runs of SVT.  She is on a low-dose beta-blocker.  Since I saw her a year ago she has remained clinically stable.  She no longer has palpitations.  She denies chest pain or shortness of breath.  Her lipid profile has improved.  She has gained 15 pounds however.   Current Meds  Medication Sig   Ascorbic Acid (VITAMIN C PO) Take by mouth.   atorvastatin (LIPITOR) 40 MG tablet Take 40 mg by  mouth daily.   Cyanocobalamin (VITAMIN B-12 PO) Take by mouth.   estradiol (ESTRACE VAGINAL) 0.1 MG/GM vaginal cream Place 1 g vaginally 2 (two) times a week. Initial dose: Nightly x 2 weeks, then twice weekly at night   fluticasone (FLONASE) 50 MCG/ACT nasal spray Place 2 sprays into both nostrils daily as needed for allergies.   hydrochlorothiazide (HYDRODIURIL) 25 MG tablet Take 25 mg by mouth every morning.   hydrOXYzine (ATARAX/VISTARIL) 10 MG tablet Take 1 tablet (10 mg total) by mouth 3 (three) times daily as needed for itching.   loratadine (CLARITIN) 10 MG tablet Take 1 tablet (10 mg total) by mouth daily.   MAGNESIUM PO Take by mouth.   metoprolol succinate (TOPROL-XL) 25 MG 24 hr tablet TAKE 1/2 BY MOUTH 2 TIMES DAILY *KEEP UPCOMING APPT FOR REFILLS*   Multiple Vitamin (MULTIVITAMIN WITH MINERALS) TABS tablet Take 1 tablet by mouth daily.   VITAMIN D PO Take by mouth.   [DISCONTINUED] atorvastatin (LIPITOR) 40 MG tablet TAKE 1 TABLET BY MOUTH EVERY DAY     No Known Allergies  Social History   Socioeconomic History   Marital status: Single    Spouse name: Not on file   Number of children: Not on file   Years of education: Not on file   Highest education level: Not on file  Occupational History   Occupation: Unemployed     Employer: [employer]  Tobacco Use   Smoking status: Former  Current packs/day: 0.00    Average packs/day: 0.1 packs/day for 18.0 years (1.8 ttl pk-yrs)    Types: Cigarettes    Start date: 07/13/1997    Quit date: 07/14/2015    Years since quitting: 7.1   Smokeless tobacco: Never   Tobacco comments:    only smokes 1 -2 cigarettes daily.  Vaping Use   Vaping status: Never Used  Substance and Sexual Activity   Alcohol use: Yes    Comment: Occas wine   Drug use: No   Sexual activity: Not Currently  Other Topics Concern   Not on file  Social History Narrative   Not on file   Social Determinants of Health   Financial Resource Strain: Not  on file  Food Insecurity: Not on file  Transportation Needs: Not on file  Physical Activity: Not on file  Stress: Not on file  Social Connections: Unknown (06/04/2021)   [facility]   Social Network    Social Network: Not on file  Intimate Partner Violence: Unknown (04/26/2021)   Received from [facility]   HITS    Physically Hurt: Not on file    Insult or Talk Down To: Not on file    Threaten Physical Harm: Not on file    Scream or Curse: Not on file     Review of Systems: General: negative for chills, fever, night sweats or weight changes.  Cardiovascular: negative for chest pain, dyspnea on exertion, edema, orthopnea, palpitations, paroxysmal nocturnal dyspnea or shortness of breath Dermatological: negative for rash Respiratory: negative for cough or wheezing Urologic: negative for hematuria Abdominal: negative for nausea, vomiting, diarrhea, bright red blood per rectum, melena, or hematemesis Neurologic: negative for visual changes, syncope, or dizziness All other systems reviewed and are otherwise negative except as noted above.    Blood pressure 134/78, pulse 62, height 5\' 7"  (1.702 m), weight 225 lb 3.2 oz (102.2 kg), SpO2 98%.  General appearance: alert and no distress Neck: no adenopathy, no carotid bruit, no JVD, supple, symmetrical, trachea midline, and thyroid not enlarged, symmetric, no tenderness/mass/nodules Lungs: clear to auscultation bilaterally Heart: Regular rate and rhythm without murmurs gallops rubs or clicks Extremities: 1+ ankle edema bilaterally Pulses: 2+ and symmetric Skin: Skin color, texture, turgor normal. No rashes or lesions Neurologic: Grossly normal  EKG EKG Interpretation Date/Time:  Friday September 14 2022 15:16:24 EDT Ventricular Rate:  60 PR Interval:  146 QRS Duration:  86 QT Interval:  422 QTC Calculation: 422 R Axis:   66  Text Interpretation: Normal sinus rhythm Biatrial enlargement When compared with ECG of  18-Apr-2021 15:30, PREVIOUS ECG IS PRESENT Confirmed by Nanetta Batty [phone]) on 09/14/2022 4:05:48 PM    ASSESSMENT AND PLAN:   HLD (hyperlipidemia) History of hyperlipidemia on atorvastatin 40 mg a day with lipid profile performed 09/05/2022 revealing a total cholesterol 149, LDL of 84 and HDL of 56, acceptable for primary prevention.  Overweight(278.02) History of moderate obesity with a BMI of 35.  She has gained 15 pounds since I saw her a year ago.  I am going to refer her to the [facility] diet wellness center for physician assisted weight loss.  Ex-light cigarette smoker (1-9 per day) History of ongoing tobacco abuse with only 1 cigarette a day.  Hypertension History of essential hypertension blood pressure measured today at 134/78.  She is on hydrochlorothiazide and metoprolol.     Runell Gess MD FACP,FACC,FAHA, [facility] 09/14/2022 4:22 PM

## 2022-09-14 NOTE — Patient Instructions (Addendum)
Medication Instructions:  CONTINUE current meds  *If you need a refill on your cardiac medications before your next appointment, please call your pharmacy*   Follow-Up: At [facility], you and your health needs are our priority.  As part of our continuing mission to provide you with exceptional heart care, we have created designated Provider Care Teams.  These Care Teams include your primary Cardiologist (physician) and Advanced Practice Providers (APPs -  Physician Assistants and Nurse Practitioners) who all work together to provide you with the care you need, when you need it.  We recommend signing up for the patient portal called "MyChart".  Sign up information is provided on this After Visit Summary.  MyChart is used to connect with patients for Virtual Visits (Telemedicine).  Patients are able to view lab/test results, encounter notes, upcoming appointments, etc.  Non-urgent messages can be sent to your provider as well.   To learn more about what you can do with MyChart, go to [email or web address].    Your next appointment:    6 months with PA or NP  12 months with Dr. Allyson Sabal   Other Instructions You have been referred to [facility]

## 2022-09-26 DIAGNOSIS — Z0000 Encounter for general adult medical examination without abnormal findings: Secondary | ICD-10-CM | POA: Diagnosis not present

## 2022-09-26 DIAGNOSIS — Z1339 Encounter for screening examination for other mental health and behavioral disorders: Secondary | ICD-10-CM | POA: Diagnosis not present

## 2022-09-26 DIAGNOSIS — Z1331 Encounter for screening for depression: Secondary | ICD-10-CM | POA: Diagnosis not present

## 2022-09-26 DIAGNOSIS — E041 Nontoxic single thyroid nodule: Secondary | ICD-10-CM | POA: Diagnosis not present

## 2022-10-04 ENCOUNTER — Other Ambulatory Visit: Payer: Self-pay | Admitting: Cardiovascular Disease

## 2022-10-11 DIAGNOSIS — M25572 Pain in left ankle and joints of left foot: Secondary | ICD-10-CM | POA: Diagnosis not present

## 2022-12-12 ENCOUNTER — Ambulatory Visit (INDEPENDENT_AMBULATORY_CARE_PROVIDER_SITE_OTHER): Payer: BC Managed Care – PPO | Admitting: Nurse Practitioner

## 2022-12-12 ENCOUNTER — Encounter: Payer: Self-pay | Admitting: Nurse Practitioner

## 2022-12-12 VITALS — BP 130/84 | Ht 67.0 in | Wt 220.0 lb

## 2022-12-12 DIAGNOSIS — Z01419 Encounter for gynecological examination (general) (routine) without abnormal findings: Secondary | ICD-10-CM | POA: Diagnosis not present

## 2022-12-12 DIAGNOSIS — Z780 Asymptomatic menopausal state: Secondary | ICD-10-CM

## 2022-12-12 DIAGNOSIS — N951 Menopausal and female climacteric states: Secondary | ICD-10-CM

## 2022-12-12 MED ORDER — VENLAFAXINE HCL ER 37.5 MG PO CP24: 37.5000 mg | ORAL_CAPSULE | Freq: Every day | ORAL | 2 refills | Status: DC

## 2022-12-12 NOTE — Progress Notes (Unsigned)
Gabriela [name] 1960-06-30 [id number]   History:  62 y.o. [id number] presents for annual exam.  Postmenopausal - no HRT. S/P 2003 TVH for fibroids. Complains of hot flashes, night sweats and mood changes. Was prescribed Veozah last year but patient never started. The irritability is most bothersome for her. Has tried OTC supplements with no improvement. 1993 cryosurgery, subsequent paps normal. HTN, HLD managed by PCP.   Gynecologic History No LMP recorded. Patient has had a hysterectomy.   Contraception/Family planning: status post hysterectomy Sexually active: No  Health Maintenance Last Pap: 10/20/2019. Results were: Normal Last mammogram: 05/10/2022. Results were: Possible left breast mass, follow up imaging normal Last colonoscopy: 2023. Results were: Polyps, 5-year recall Last Dexa: 10/21/2018. Results were: Normal, 5-year follow up  Past medical history, past surgical history, family history and social history were all reviewed and documented in the EPIC chart. Works at [employer]. Son and oldest grandson live locally. 74 yo grandson in [place].  ROS:  A ROS was performed and pertinent positives and negatives are included.  Exam:  Vitals:   12/12/22 1618  BP: 130/84  Weight: 220 lb (99.8 kg)  Height: 5\' 7"  (1.702 m)      Body mass index is 34.46 kg/m.  General appearance:  Normal Thyroid:  Symmetrical, normal in size, without palpable masses or nodularity. Respiratory  Auscultation:  Clear without wheezing or rhonchi Cardiovascular  Auscultation:  Regular rate, without rubs, murmurs or gallops  Edema/varicosities:  Not grossly evident Abdominal  Soft,nontender, without masses, guarding or rebound.  Liver/spleen:  No organomegaly noted  Hernia:  None appreciated  Skin  Inspection:  Grossly normal   Breasts: Examined lying and sitting.   Right: Without masses, retractions, discharge or axillary adenopathy.   Left: Without masses, retractions, discharge or  axillary adenopathy. Pelvic: External genitalia:  no lesions              Urethra:  normal appearing urethra with no masses, tenderness or lesions              Bartholins and Skenes: normal                 Vagina: normal appearing vagina with normal color and discharge, no lesions              Cervix: absent Bimanual Exam:  Uterus: absent              Adnexa: no mass, fullness, tenderness              Rectovaginal: Deferred              Anus:  normal, no lesions  Patient informed chaperone available to be present for breast and pelvic exam. Patient has requested no chaperone to be present. Patient has been advised what will be completed during breast and pelvic exam.   Assessment/Plan:  62 y.o. [id number] for annual exam.   Well female exam with routine gynecological exam - Education provided on SBEs, importance of preventative screenings, current guidelines, high calcium diet, regular exercise, and multivitamin daily.  Labs with PCP.   Postmenopausal - no HRT. S/P 2003 TVH for fibroids.   Vasomotor symptoms due to menopause - Plan: venlafaxine XR (EFFEXOR XR) 37.5 MG 24 hr capsule daily. Discussed management with OTC supplement, SSRIs, Veozah, HRT. Would like to try SSRI. Educated on initial side effects.  Screening for cervical cancer - Normal Pap history.  No longer screening per guidelines.   Screening for breast  cancer - UTD. Continue annual screenings. Normal breast exam today.  Screening for colon cancer - 2023 colonoscopy. Will repeat at GI's recommended interval.   Screening for osteoporosis - Normal bone density 2020. Will repeat at 5 years per recommendation.   Return in about 1 year (around 12/12/2023) for Annual.    Olivia Mackie [facility], 4:40 PM 12/12/2022

## 2022-12-28 DIAGNOSIS — J3489 Other specified disorders of nose and nasal sinuses: Secondary | ICD-10-CM | POA: Diagnosis not present

## 2022-12-28 DIAGNOSIS — I10 Essential (primary) hypertension: Secondary | ICD-10-CM | POA: Diagnosis not present

## 2023-02-22 DIAGNOSIS — K219 Gastro-esophageal reflux disease without esophagitis: Secondary | ICD-10-CM | POA: Diagnosis not present

## 2023-02-22 DIAGNOSIS — E041 Nontoxic single thyroid nodule: Secondary | ICD-10-CM | POA: Diagnosis not present

## 2023-02-22 DIAGNOSIS — H6992 Unspecified Eustachian tube disorder, left ear: Secondary | ICD-10-CM | POA: Diagnosis not present

## 2023-02-27 DIAGNOSIS — E041 Nontoxic single thyroid nodule: Secondary | ICD-10-CM | POA: Diagnosis not present

## 2023-03-27 ENCOUNTER — Other Ambulatory Visit (HOSPITAL_COMMUNITY): Payer: Self-pay | Admitting: Otolaryngology

## 2023-03-27 ENCOUNTER — Ambulatory Visit
Admission: RE | Admit: 2023-03-27 | Discharge: 2023-03-27 | Disposition: A | Discharge status: Home / Self Care | Payer: BC Managed Care – PPO | Source: Ambulatory Visit | Attending: Otolaryngology

## 2023-03-27 DIAGNOSIS — E041 Nontoxic single thyroid nodule: Secondary | ICD-10-CM

## 2023-04-25 ENCOUNTER — Other Ambulatory Visit: Payer: Self-pay | Admitting: Nurse Practitioner

## 2023-04-25 DIAGNOSIS — Z1231 Encounter for screening mammogram for malignant neoplasm of breast: Secondary | ICD-10-CM

## 2023-05-09 DIAGNOSIS — K529 Noninfective gastroenteritis and colitis, unspecified: Secondary | ICD-10-CM | POA: Diagnosis not present

## 2023-05-09 DIAGNOSIS — R197 Diarrhea, unspecified: Secondary | ICD-10-CM | POA: Diagnosis not present

## 2023-05-14 ENCOUNTER — Ambulatory Visit
Admission: RE | Admit: 2023-05-14 | Discharge: 2023-05-14 | Disposition: A | Discharge status: Home / Self Care | Source: Ambulatory Visit | Attending: Nurse Practitioner | Admitting: Nurse Practitioner

## 2023-05-14 DIAGNOSIS — Z1231 Encounter for screening mammogram for malignant neoplasm of breast: Secondary | ICD-10-CM | POA: Diagnosis not present

## 2023-06-06 DIAGNOSIS — K760 Fatty (change of) liver, not elsewhere classified: Secondary | ICD-10-CM | POA: Diagnosis not present

## 2023-07-03 ENCOUNTER — Other Ambulatory Visit: Payer: Self-pay | Admitting: Nurse Practitioner

## 2023-07-03 DIAGNOSIS — K760 Fatty (change of) liver, not elsewhere classified: Secondary | ICD-10-CM

## 2023-07-14 ENCOUNTER — Other Ambulatory Visit: Payer: Self-pay

## 2023-07-14 ENCOUNTER — Encounter (HOSPITAL_COMMUNITY): Payer: Self-pay

## 2023-07-14 ENCOUNTER — Emergency Department (HOSPITAL_COMMUNITY)
Admission: EM | Admit: 2023-07-14 | Discharge: 2023-07-14 | Disposition: A | Discharge status: Home / Self Care | Attending: Emergency Medicine | Admitting: Emergency Medicine

## 2023-07-14 DIAGNOSIS — M79662 Pain in left lower leg: Secondary | ICD-10-CM | POA: Diagnosis not present

## 2023-07-14 DIAGNOSIS — Z7901 Long term (current) use of anticoagulants: Secondary | ICD-10-CM | POA: Diagnosis not present

## 2023-07-14 DIAGNOSIS — Z87891 Personal history of nicotine dependence: Secondary | ICD-10-CM | POA: Diagnosis not present

## 2023-07-14 DIAGNOSIS — I10 Essential (primary) hypertension: Secondary | ICD-10-CM | POA: Insufficient documentation

## 2023-07-14 DIAGNOSIS — Z79899 Other long term (current) drug therapy: Secondary | ICD-10-CM | POA: Insufficient documentation

## 2023-07-14 DIAGNOSIS — G8929 Other chronic pain: Secondary | ICD-10-CM | POA: Diagnosis not present

## 2023-07-14 DIAGNOSIS — M79605 Pain in left leg: Secondary | ICD-10-CM | POA: Insufficient documentation

## 2023-07-14 MED ORDER — APIXABAN 5 MG PO TABS
10.0000 mg | ORAL_TABLET | Freq: Once | ORAL | Status: AC
  Administered 2023-07-14: 10 mg via ORAL
  Filled 2023-07-14: qty 4

## 2023-07-14 NOTE — ED Triage Notes (Signed)
 Pt c/o foot pain and swelling since Friday. Denies any recent injury or trauma. +PMS. Took tylenol  for the pain w/o relief.

## 2023-07-14 NOTE — ED Provider Notes (Signed)
 [place] EMERGENCY DEPARTMENT AT [facility] Provider Note   CSN: [id number] Arrival date & time: 07/14/23  1851     Patient presents with: Foot Pain   Gabriela [name] is a 63 y.o. female who presents emergency department with a chief complaint of left lower extremity pain.  Patient denies trauma/injury.  Patient states that years ago she had a left hip replacement and that she has had some pain in her left lower extremity since then, however since Friday she has had worsening crampy pain extending up from her foot through her calf.  Patient states that pain is worse with ambulation and movement.  Denies fever, chills, chest pain, shortness of breath, weakness, numbness/tingling.    Foot Pain       Prior to Admission medications   Medication Sig Start Date End Date Taking? Authorizing Provider  Ascorbic Acid (VITAMIN C PO) Take by mouth.    [provider]  atorvastatin  (LIPITOR) 40 MG tablet Take 40 mg by mouth daily.    [provider]  Cyanocobalamin (VITAMIN B-12 PO) Take by mouth.    [provider]  estradiol  (ESTRACE  VAGINAL) 0.1 MG/GM vaginal cream Place 1 g vaginally 2 (two) times a week. Initial dose: Nightly x 2 weeks, then twice weekly at night Patient not taking: Reported on 12/12/2022 12/07/21   Prentiss Riggs A, NP  Fezolinetant  (VEOZAH ) 45 MG TABS Take 1 tablet by mouth daily. Patient not taking: Reported on 12/12/2022 11/08/21   Prentiss Riggs LABOR, NP  fluticasone  (FLONASE ) 50 MCG/ACT nasal spray Place 2 sprays into both nostrils daily as needed for allergies. 08/27/21   Enedelia Dorna HERO, FNP  hydrochlorothiazide  (HYDRODIURIL ) 25 MG tablet Take 25 mg by mouth every morning. 11/14/21   [provider]  hydrOXYzine  (ATARAX /VISTARIL ) 10 MG tablet Take 1 tablet (10 mg total) by mouth 3 (three) times daily as needed for itching. 04/18/16   Jamey Mardy LABOR, MD  loratadine  (CLARITIN ) 10 MG tablet Take 1 tablet (10 mg  total) by mouth daily. 08/27/21   Enedelia Dorna HERO, FNP  MAGNESIUM  PO Take by mouth.    [provider]  metoprolol  succinate (TOPROL -XL) 25 MG 24 hr tablet Take 0.5 tablets (12.5 mg total) by mouth daily. 10/04/22   Court Dorn JINNY, MD  TURMERIC PO Take 1 tablet by mouth 2 (two) times daily.    [provider]  venlafaxine  XR (EFFEXOR  XR) 37.5 MG 24 hr capsule Take 1 capsule (37.5 mg total) by mouth daily with breakfast. 12/12/22   Prentiss Riggs LABOR, NP  VITAMIN D  PO Take by mouth.    [provider]  VITAMIN E PO Take by mouth.    [provider]    Allergies: Patient has no known allergies.    Review of Systems  Musculoskeletal:        Left foot pain    Updated Vital Signs BP (!) 155/86   Pulse 80   Temp 98 F (36.7 C) (Oral)   Resp 18   Ht 5' 7 (1.702 m)   Wt 99.8 kg   SpO2 94%   BMI 34.46 kg/m   Physical Exam Vitals and nursing note reviewed.  Constitutional:      General: She is awake. She is not in acute distress.    Appearance: Normal appearance. She is not ill-appearing, toxic-appearing or diaphoretic.  HENT:     Head: Normocephalic and atraumatic.   Eyes:     Extraocular Movements: Extraocular movements intact.  Pulmonary:     Effort: Pulmonary effort is normal.   Musculoskeletal:        General: Normal range of motion.     Comments: Range of motion of left lower extremity including knee, ankle, toes intact Left knee flexion and extension intact Left foot plantarflexion and dorsiflexion intact Sensation of left foot intact Patient able to move all toes of left foot as expected No sign of calf redness or tenderness on exam Very mild swelling of the left foot and left calf present Patient ambulatory under own power   Skin:    General: Skin is warm and dry.     Capillary Refill: Capillary refill takes less than 2 seconds.   Neurological:     General: No focal deficit present.     Mental Status: She is alert  and oriented to person, place, and time.     Sensory: No sensory deficit.     Motor: No weakness.     Coordination: Coordination normal.   Psychiatric:        Mood and Affect: Mood normal.        Behavior: Behavior normal. Behavior is cooperative.     (all labs ordered are listed, but only abnormal results are displayed) Labs Reviewed - No data to display  EKG: None  Radiology: No results found.   Procedures   Medications Ordered in the ED  apixaban (ELIQUIS) tablet 10 mg (10 mg Oral Given 07/14/23 2115)                                    Medical Decision Making Risk Prescription drug management.   Patient presents to the ED for concern of left lower extremity pain, this involves an extensive number of treatment options, and is a complaint that carries with it a high risk of complications and morbidity.  The differential diagnosis includes fracture, soft tissue injury, trauma/injury, DVT, cellulitis, etc.   Co morbidities that complicate the patient evaluation  Obesity, hypertension, ex smoker   Imaging Studies ordered:  I ordered imaging studies including DVT rule out ultrasound of left lower extremity I independently visualized and interpreted imaging which showed -the study was unable to be completed at [facility] as it was after hours for ultrasound, referral placed and arrangements made for patient to have ultrasound completed in the morning using DVT outpatient referral process I agree with the radiologist interpretation   Medicines ordered and prescription drug management:  I ordered medication including Eliquis for possible DVT Reevaluation of the patient after these medicines showed that the patient stayed the same I have reviewed the patients home medicines and have made adjustments as needed   Test Considered:  X-ray of left lower extremity: Patient denies trauma/injury, nonpainful to palpation, range of motion of left lower extremity  intact, declined at this time   Critical Interventions:  None   Problem List / ED Course:  63 year old female here for left lower extremity pain Patient states that she has had chronic left lower extremity pain since having her hip replacement however since Friday she has had a different crampy type of pain worse with movement Physical exam overall reassuring, mild swelling present to foot and calf however no redness or obvious tenderness DP pulse confirmed with Doppler 2+ Very low clinical suspicion for arterial clot/ischemic limb Due to patient description of pain and being nontraumatic, DVT ultrasound ordered, after hours at [facility]  [place] for ultrasound, arrangements made for patient to have exam completed in the morning 1 dose of Eliquis given prior to discharge in the emergency department as prophylactic for possible DVT Patient denies chest pain or shortness of breath, vital signs stable Return precautions given Patient discharged   Reevaluation:  After the interventions noted above, I reevaluated the patient and found that they have :stayed the same   Social Determinants of Health:  none   Dispostion:  After consideration of the diagnostic results and the patients response to treatment, I feel that the patent would benefit from follow-up for DVT ultrasound in the morning as instructed.  Patient should then follow treatment recommendations based on ultrasound results.  Follow-up with primary care and specialist as scheduled, or sooner if symptoms warrant.  Return precautions given.  Patient discharged.     Final diagnoses:  Acute leg pain, left    ED Discharge Orders          Ordered    LE Venous       Comments: IMPORTANT PATIENT INSTRUCTIONS: You have been scheduled for an Outpatient Vascular Study at [facility].  If tomorrow is a Saturday, Sunday or holiday, please go to the [facility] Emergency Department Registration Desk at 11 am tomorrow morning and tell  them you are there for a vascular study.  If tomorrow is a weekday (Monday-Friday), please go to the [facility] (address [address] [place]) at 8 am and report to the 4th floor registration Zone A.  Inform registration that you are there for a vascular study.   07/14/23 2049               Janetta Terrall FALCON, PA-C 07/14/23 2342    Randol Simmonds, MD 07/16/23 [phone]

## 2023-07-14 NOTE — Discharge Instructions (Addendum)
 It was a pleasure taking care of you today. Based on your history and physical exam I feel you are safe for discharge.  You have been given 1 dose of a blood thinning medication in the emergency department today for the possibility of a blood clot in your left leg.  Please report as instructed in the morning for the ultrasound.  If the ultrasound is positive they will give you instructions on what to do for treatment, if it is negative you can follow-up with your orthopedic provider for ongoing diagnosis and treatment.  Please return to the emergency department or seek further medical care if you experience any of the following symptoms including chest pain, shortness of breath, worsening pain, redness of calf, worsening swelling of calf, weakness.  Please also be mindful of any falls or head injuries, as you have now taken a blood thinning medication.

## 2023-07-15 ENCOUNTER — Ambulatory Visit (HOSPITAL_COMMUNITY)
Admission: RE | Admit: 2023-07-15 | Discharge: 2023-07-15 | Disposition: A | Discharge status: Home / Self Care | Source: Ambulatory Visit

## 2023-07-15 DIAGNOSIS — M79605 Pain in left leg: Secondary | ICD-10-CM

## 2023-07-15 DIAGNOSIS — M79662 Pain in left lower leg: Secondary | ICD-10-CM | POA: Diagnosis not present

## 2023-07-19 ENCOUNTER — Ambulatory Visit
Admission: RE | Admit: 2023-07-19 | Discharge: 2023-07-19 | Disposition: A | Discharge status: Home / Self Care | Source: Ambulatory Visit | Attending: Nurse Practitioner

## 2023-07-19 DIAGNOSIS — K760 Fatty (change of) liver, not elsewhere classified: Secondary | ICD-10-CM

## 2023-07-19 DIAGNOSIS — K769 Liver disease, unspecified: Secondary | ICD-10-CM | POA: Diagnosis not present

## 2023-07-23 ENCOUNTER — Other Ambulatory Visit: Payer: Self-pay | Admitting: Cardiovascular Disease

## 2023-07-23 DIAGNOSIS — M7989 Other specified soft tissue disorders: Secondary | ICD-10-CM | POA: Diagnosis not present

## 2023-07-23 DIAGNOSIS — R252 Cramp and spasm: Secondary | ICD-10-CM | POA: Diagnosis not present

## 2023-09-02 DIAGNOSIS — H9313 Tinnitus, bilateral: Secondary | ICD-10-CM | POA: Diagnosis not present

## 2023-09-02 DIAGNOSIS — R42 Dizziness and giddiness: Secondary | ICD-10-CM | POA: Diagnosis not present

## 2023-09-02 DIAGNOSIS — E041 Nontoxic single thyroid nodule: Secondary | ICD-10-CM | POA: Diagnosis not present

## 2023-09-12 ENCOUNTER — Other Ambulatory Visit: Payer: Self-pay

## 2023-09-12 DIAGNOSIS — R600 Localized edema: Secondary | ICD-10-CM

## 2023-09-19 DIAGNOSIS — H903 Sensorineural hearing loss, bilateral: Secondary | ICD-10-CM | POA: Diagnosis not present

## 2023-09-20 ENCOUNTER — Ambulatory Visit (INDEPENDENT_AMBULATORY_CARE_PROVIDER_SITE_OTHER): Admitting: Radiology

## 2023-09-20 ENCOUNTER — Encounter: Payer: Self-pay | Admitting: Radiology

## 2023-09-20 VITALS — BP 134/84 | Temp 98.3°F | Wt 217.0 lb

## 2023-09-20 DIAGNOSIS — N644 Mastodynia: Secondary | ICD-10-CM

## 2023-09-20 DIAGNOSIS — N941 Unspecified dyspareunia: Secondary | ICD-10-CM | POA: Diagnosis not present

## 2023-09-20 DIAGNOSIS — N952 Postmenopausal atrophic vaginitis: Secondary | ICD-10-CM | POA: Diagnosis not present

## 2023-09-20 MED ORDER — ESTRADIOL 0.1 MG/GM VA CREA: 1.0000 g | TOPICAL_CREAM | VAGINAL | 2 refills | Status: DC

## 2023-09-20 NOTE — Progress Notes (Signed)
   Gabriela [name] 17-Jan-1961 [id number]   History:  63 y.o. G3P1s/p hyst presents with complaints of breast tenderness 2 weeks ago, lasted a few days and then resolved. No masses. Also needs a refill on estrace  cream.  Gynecologic History No LMP recorded. Patient has had a hysterectomy.   Last mammogram: 05/14/23. Results were: normal  Obstetric History OB History  Gravida Para Term Preterm AB Living  3 1 0 0 2 1  SAB IAB Ectopic Multiple Live Births  0 2 0 0     # Outcome Date GA Lbr Len/2nd Weight Sex Type Anes PTL Lv  3 IAB           2 IAB           1 Para      Vag-Spont        The following portions of the patient's history were reviewed and updated as appropriate: allergies, current medications, past family history, past medical history, past social history, past surgical history, and problem list.  Review of Systems Pertinent items noted in HPI and remainder of comprehensive ROS otherwise negative.   Past medical history, past surgical history, family history and social history were all reviewed and documented in the EPIC chart.   Exam:  Vitals:   09/20/23 1457  BP: 134/84  Temp: 98.3 F (36.8 C)  TempSrc: Oral  Weight: 217 lb (98.4 kg)   Body mass index is 33.99 kg/m.  Physical Exam Vitals and nursing note reviewed.  Constitutional:      Appearance: Normal appearance. She is normal weight.  Chest:  Breasts:    Right: Normal.     Left: Normal.  Neurological:     Mental Status: She is alert.      Assessment/Plan:   1. Breast pain (Primary) Reassured normal exam  2. Vaginal atrophy - estradiol  (ESTRACE  VAGINAL) 0.1 MG/GM vaginal cream; Place 1 g vaginally 2 (two) times a week. Initial dose: Nightly x 2 weeks, then twice weekly at night  Dispense: 42.5 g; Refill: 2    Tyreck Bell B WHNP-BC 3:08 PM 09/20/2023

## 2023-09-26 ENCOUNTER — Ambulatory Visit: Admitting: Nurse Practitioner

## 2023-10-02 ENCOUNTER — Ambulatory Visit: Attending: Cardiovascular Disease | Admitting: Cardiovascular Disease

## 2023-10-02 ENCOUNTER — Encounter: Payer: Self-pay | Admitting: Cardiovascular Disease

## 2023-10-02 VITALS — BP 136/84 | HR 60 | Ht 67.0 in | Wt 222.0 lb

## 2023-10-02 DIAGNOSIS — E782 Mixed hyperlipidemia: Secondary | ICD-10-CM | POA: Diagnosis not present

## 2023-10-02 DIAGNOSIS — I10 Essential (primary) hypertension: Secondary | ICD-10-CM

## 2023-10-02 DIAGNOSIS — I471 Supraventricular tachycardia, unspecified: Secondary | ICD-10-CM | POA: Diagnosis not present

## 2023-10-02 NOTE — Assessment & Plan Note (Signed)
 History of essential hypertension with blood pressure measured today at 136/84.  She is on HydroDIURIL  and metoprolol .

## 2023-10-02 NOTE — Assessment & Plan Note (Signed)
 History of palpitations in the past without monitor that showed short runs of SVT.  When I saw her a year ago these have resolved.  She gets them occasionally now.

## 2023-10-02 NOTE — Patient Instructions (Signed)

## 2023-10-02 NOTE — Assessment & Plan Note (Signed)
 History of hyperlipidemia on statin therapy with lipid profile performed 09/05/2022 revealing a total cholesterol 149, LDL of 84 and HDL of 56.

## 2023-10-02 NOTE — Progress Notes (Signed)
 10/02/2023 Gabriela [name]   1960/02/29  [id number]  Primary Physician Larnell Hamilton, MD Primary Cardiologist: Dorn JINNY Lesches MD FACP, [place], [place], FSCAI  HPI:  Gabriela [name] is a 63 y.o.  moderately overweight single [place] female mother of one child, grandmother and 2 grandchildren who I last saw in the 09/14/2022. Her cardiac risk factors include family history with a mother who died myocardial infarction at age 3, hypertension, hyperlipidemia and  continue tobacco abuse with 3 to 4 cigarettes a day.  She did have a heart catheterization which I performed 07/06/04 which wasn't normal. She does complain of occasional chest pain occurring every several months. These have not changed in frequency or severity.   She had a left total hip replacement by Dr. 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   She did have a negative GXT 09/19/2016.  Her most recent lipid profile performed 07/10/2019 revealed a total cholesterol of 197, LDL of 127 and HDL of 61.   She  obtained her second Covid shot and several days later she developed dizziness and tachypalpitations requiring evaluation by Dr. Koren Dawn in the ER [facility].  Her laboratory exam was unremarkable but her EKG did show runs of SVT which responded to low-dose IV beta-blockade.  She says she has not felt herself since her Covid vaccine.   She had a 2D echo which was fairly unremarkable revealing normal ejection fraction with grade 1 diastolic dysfunction.  An event monitor showed occasional PVCs, PACs and short runs of SVT.  She is on a low-dose beta-blocker.  Since I saw her a year ago she has remained clinically stable.  She has occasional palpitations.  She denies chest pain or shortness of breath.   Current Meds  Medication Sig   Ascorbic Acid (VITAMIN C PO) Take by mouth.   atorvastatin  (LIPITOR) 40 MG tablet TAKE 1 TABLET BY MOUTH EVERY DAY   Cyanocobalamin (VITAMIN B-12 PO) Take by mouth.    estradiol  (ESTRACE  VAGINAL) 0.1 MG/GM vaginal cream Place 1 g vaginally 2 (two) times a week. Initial dose: Nightly x 2 weeks, then twice weekly at night   fluticasone  (FLONASE ) 50 MCG/ACT nasal spray Place 2 sprays into both nostrils daily as needed for allergies.   hydrochlorothiazide  (HYDRODIURIL ) 25 MG tablet Take 25 mg by mouth every morning.   hydrOXYzine  (ATARAX /VISTARIL ) 10 MG tablet Take 1 tablet (10 mg total) by mouth 3 (three) times daily as needed for itching.   loratadine  (CLARITIN ) 10 MG tablet Take 1 tablet (10 mg total) by mouth daily.   MAGNESIUM  PO Take by mouth.   metoprolol  succinate (TOPROL -XL) 25 MG 24 hr tablet Take 0.5 tablets (12.5 mg total) by mouth daily.   TURMERIC PO Take 1 tablet by mouth 2 (two) times daily.   VITAMIN D  PO Take by mouth.   VITAMIN E PO Take by mouth.     No Known Allergies  Social History   Socioeconomic History   Marital status: Single    Spouse name: Not on file   Number of children: Not on file   Years of education: Not on file   Highest education level: Not on file  Occupational History   Occupation: Unemployed     Employer: [employer]  Tobacco Use   Smoking status: Some Days    Current packs/day: 0.00    Average packs/day: 0.1 packs/day for 18.0 years (1.8 ttl pk-yrs)    Types: Cigarettes  Start date: 07/13/1997    Last attempt to quit: 07/14/2015    Years since quitting: 8.2    Passive exposure: Never   Smokeless tobacco: Never   Tobacco comments:    only smokes 1 -2 cigarettes daily.  Vaping Use   Vaping status: Never Used  Substance and Sexual Activity   Alcohol  use: Yes    Comment: Occas wine   Drug use: No   Sexual activity: Not Currently    Partners: Male    Birth control/protection: Surgical    Comment: Hyst  Other Topics Concern   Not on file  Social History Narrative   Not on file   Social Drivers of Health   Financial Resource Strain: Not on file  Food Insecurity: Not on file   Transportation Needs: Not on file  Physical Activity: Not on file  Stress: Not on file  Social Connections: Unknown (06/04/2021)   Received from [facility]   Social Network    Social Network: Not on file  Intimate Partner Violence: Unknown (04/26/2021)   Received from [facility]   HITS    Physically Hurt: Not on file    Insult or Talk Down To: Not on file    Threaten Physical Harm: Not on file    Scream or Curse: Not on file     Review of Systems: General: negative for chills, fever, night sweats or weight changes.  Cardiovascular: negative for chest pain, dyspnea on exertion, edema, orthopnea, palpitations, paroxysmal nocturnal dyspnea or shortness of breath Dermatological: negative for rash Respiratory: negative for cough or wheezing Urologic: negative for hematuria Abdominal: negative for nausea, vomiting, diarrhea, bright red blood per rectum, melena, or hematemesis Neurologic: negative for visual changes, syncope, or dizziness All other systems reviewed and are otherwise negative except as noted above.    Blood pressure 136/84, pulse 60, height 5' 7 (1.702 m), weight 222 lb (100.7 kg), SpO2 98%.  General appearance: alert and no distress Neck: no adenopathy, no carotid bruit, no JVD, supple, symmetrical, trachea midline, and thyroid  not enlarged, symmetric, no tenderness/mass/nodules Lungs: clear to auscultation bilaterally Heart: regular rate and rhythm, S1, S2 normal, no murmur, click, rub or gallop Extremities: extremities normal, atraumatic, no cyanosis or edema Pulses: 2+ and symmetric Skin: Skin color, texture, turgor normal. No rashes or lesions Neurologic: Grossly normal  EKG EKG Interpretation Date/Time:  Wednesday October 02 2023 15:51:32 EDT Ventricular Rate:  60 PR Interval:  154 QRS Duration:  90 QT Interval:  424 QTC Calculation: 424 R Axis:   -13  Text Interpretation: Sinus rhythm with Premature atrial complexes Possible Left atrial  enlargement Minimal voltage criteria for LVH, may be normal variant ( Cornell product ) When compared with ECG of 14-Sep-2022 15:16, Premature atrial complexes are now Present Questionable change in QRS axis T wave inversion no longer evident in Inferior leads Confirmed by Court Carrier [phone]) on 10/02/2023 4:02:36 PM    ASSESSMENT AND PLAN:   HLD (hyperlipidemia) History of hyperlipidemia on statin therapy with lipid profile performed 09/05/2022 revealing a total cholesterol 149, LDL of 84 and HDL of 56.  Hypertension History of essential hypertension with blood pressure measured today at 136/84.  She is on HydroDIURIL  and metoprolol .  PSVT (paroxysmal supraventricular tachycardia) (HCC) History of palpitations in the past without monitor that showed short runs of SVT.  When I saw her a year ago these have resolved.  She gets them occasionally now.     Carrier DOROTHA Court MD FACP,FACC,FAHA, [facility] 10/02/2023 4:14  PM

## 2023-10-04 DIAGNOSIS — E041 Nontoxic single thyroid nodule: Secondary | ICD-10-CM | POA: Diagnosis not present

## 2023-10-04 DIAGNOSIS — R7302 Impaired glucose tolerance (oral): Secondary | ICD-10-CM | POA: Diagnosis not present

## 2023-10-04 DIAGNOSIS — E785 Hyperlipidemia, unspecified: Secondary | ICD-10-CM | POA: Diagnosis not present

## 2023-10-09 DIAGNOSIS — Z0000 Encounter for general adult medical examination without abnormal findings: Secondary | ICD-10-CM | POA: Diagnosis not present

## 2023-10-09 DIAGNOSIS — Z1331 Encounter for screening for depression: Secondary | ICD-10-CM | POA: Diagnosis not present

## 2023-10-09 DIAGNOSIS — Z23 Encounter for immunization: Secondary | ICD-10-CM | POA: Diagnosis not present

## 2023-10-09 DIAGNOSIS — I10 Essential (primary) hypertension: Secondary | ICD-10-CM | POA: Diagnosis not present

## 2023-10-09 DIAGNOSIS — E041 Nontoxic single thyroid nodule: Secondary | ICD-10-CM | POA: Diagnosis not present

## 2023-10-09 DIAGNOSIS — Z1339 Encounter for screening examination for other mental health and behavioral disorders: Secondary | ICD-10-CM | POA: Diagnosis not present

## 2023-10-22 ENCOUNTER — Encounter

## 2023-10-22 ENCOUNTER — Encounter (HOSPITAL_COMMUNITY)

## 2023-10-24 ENCOUNTER — Other Ambulatory Visit: Payer: Self-pay | Admitting: Cardiovascular Disease

## 2023-10-25 ENCOUNTER — Encounter: Payer: Self-pay | Admitting: Physician Assistant

## 2023-10-25 ENCOUNTER — Ambulatory Visit

## 2023-10-25 ENCOUNTER — Ambulatory Visit (HOSPITAL_COMMUNITY)
Admission: RE | Admit: 2023-10-25 | Discharge: 2023-10-25 | Disposition: A | Discharge status: Home / Self Care | Source: Ambulatory Visit | Attending: Vascular Surgery | Admitting: Vascular Surgery

## 2023-10-25 VITALS — BP 155/71 | HR 87 | Temp 97.9°F | Ht 67.0 in | Wt 218.0 lb

## 2023-10-25 DIAGNOSIS — R252 Cramp and spasm: Secondary | ICD-10-CM

## 2023-10-25 DIAGNOSIS — R600 Localized edema: Secondary | ICD-10-CM | POA: Diagnosis not present

## 2023-10-25 NOTE — Progress Notes (Signed)
 [facility]           [facility] [place]  History and Physical   Gabriela [name] is a 63 y.o. female who presents with cramping at night in her left leg and states that her orthopedic doctor referred her for evaluation.     She had a DVT study on 07/15/2023 of the left leg and it was negative for DVT.  This was done after she was seen in the ER for worsening LLE pain.  She has hx of left hip replacement and had pain in that leg since the surgery.  She was having cramping pain that extended from her foot through her calf.  It was worse with walking.    She does not have any hx of DVT.  She has some puffiness around the left ankle that has been present since her surgery.  She does not have swelling in the right leg.  She has some compression socks, but she does not wear them regularly.  She states she gets really bad cramps in her leg and toes at night.  She has gotten some cramps in her legs with walking in the past but this has improved.  She does have family hx of leg swelling/varicose veins.  She does smoke occasionally.  She states that she used to smoke but quit for a couple of years.    The pt is on a statin for cholesterol management.  The pt is not on a daily aspirin .   Other AC:  none The pt is on BB, diuretic for hypertension.   The pt is not on medication for diabetes.   Tobacco hx:  current  Pt does not have family hx of AAA.  Past Medical History:  Diagnosis Date   Anemia    Arthritis    OA   Benign colon polyp    adenomatous   Chest pain    a. normal cors by cath in 2006 b. calcium  score of 0 by Coronary CT in 11/2011   Dysmenorrhea    Fibroid    Hip pain    left hip   Hypertension    Irregular heart beat    Smoker    ONE PACK PER WEEK    Past Surgical History:  Procedure Laterality Date   ABDOMINAL HYSTERECTOMY  06/2001   WINFRED, M.D. PARTIAL   CARDIAC CATHETERIZATION  2003   Dr Court   CARDIOLITE MYOCARDIAL PERFUSION  06/07/03    NO PERFUSION ABNORMALITY. EF% 68%.   GYNECOLOGIC CRYOSURGERY  1993   DYSPLASIA   LOWER EXT ARTERIAL  07/28/08   NORMAL   TOTAL HIP ARTHROPLASTY Left 10/15/2017   Procedure: LEFT TOTAL HIP ARTHROPLASTY ANTERIOR APPROACH;  Surgeon: Beverley Evalene BIRCH, MD;  Location: [facility];  Service: Orthopedics;  Laterality: Left;   TRANSTHORACIC ECHOCARDIOGRAM  07/03/04   TRACE MR,TRACE TR,TRACE PVR   UMBILICAL HERNIA REPAIR     AS A CHILD    Social History   Socioeconomic History   Marital status: Single    Spouse name: Not on file   Number of children: Not on file   Years of education: Not on file   Highest education level: Not on file  Occupational History   Occupation: Unemployed     Employer: [employer]  Tobacco Use   Smoking status: Some Days    Current packs/day: 0.00    Average packs/day: 0.1 packs/day for 18.0 years (1.8 ttl pk-yrs)  Types: Cigarettes    Start date: 07/13/1997    Last attempt to quit: 07/14/2015    Years since quitting: 8.2    Passive exposure: Never   Smokeless tobacco: Never   Tobacco comments:    only smokes 1 -2 cigarettes daily.  Vaping Use   Vaping status: Never Used  Substance and Sexual Activity   Alcohol  use: Yes    Comment: Occas wine   Drug use: No   Sexual activity: Not Currently    Partners: Male    Birth control/protection: Surgical    Comment: Hyst  Other Topics Concern   Not on file  Social History Narrative   Not on file   Social Drivers of Health   Financial Resource Strain: Not on file  Food Insecurity: Not on file  Transportation Needs: Not on file  Physical Activity: Not on file  Stress: Not on file  Social Connections: Unknown (06/04/2021)   [facility]   Social Network    Social Network: Not on file  Intimate Partner Violence: Unknown (04/26/2021)   Received from [facility]   HITS    Physically Hurt: Not on file    Insult or Talk Down To: Not on file    Threaten Physical Harm: Not on file    Scream or  Curse: Not on file     Family History  Problem Relation Age of Onset   Heart disease Mother    Hypertension Mother    Stroke Father    Colon cancer Neg Hx    Stomach cancer Neg Hx     Current Outpatient Medications  Medication Sig Dispense Refill   Ascorbic Acid (VITAMIN C PO) Take by mouth.     atorvastatin  (LIPITOR) 40 MG tablet TAKE 1 TABLET BY MOUTH EVERY DAY 90 tablet 3   Cyanocobalamin (VITAMIN B-12 PO) Take by mouth.     estradiol  (ESTRACE  VAGINAL) 0.1 MG/GM vaginal cream Place 1 g vaginally 2 (two) times a week. Initial dose: Nightly x 2 weeks, then twice weekly at night 42.5 g 2   Fezolinetant  (VEOZAH ) 45 MG TABS Take 1 tablet by mouth daily. (Patient not taking: Reported on 10/02/2023) 30 tablet 2   fluticasone  (FLONASE ) 50 MCG/ACT nasal spray Place 2 sprays into both nostrils daily as needed for allergies. 16 g 1   hydrochlorothiazide  (HYDRODIURIL ) 25 MG tablet Take 25 mg by mouth every morning.     hydrOXYzine  (ATARAX /VISTARIL ) 10 MG tablet Take 1 tablet (10 mg total) by mouth 3 (three) times daily as needed for itching. 30 tablet 1   loratadine  (CLARITIN ) 10 MG tablet Take 1 tablet (10 mg total) by mouth daily. 30 tablet 1   MAGNESIUM  PO Take by mouth.     metoprolol  succinate (TOPROL -XL) 25 MG 24 hr tablet Take 0.5 tablets (12.5 mg total) by mouth daily. 45 tablet 3   TURMERIC PO Take 1 tablet by mouth 2 (two) times daily.     venlafaxine  XR (EFFEXOR  XR) 37.5 MG 24 hr capsule Take 1 capsule (37.5 mg total) by mouth daily with breakfast. (Patient not taking: Reported on 10/02/2023) 30 capsule 2   VITAMIN D  PO Take by mouth.     VITAMIN E PO Take by mouth.     No current facility-administered medications for this visit.    No Known Allergies  REVIEW OF SYSTEMS:   [X]  denotes positive finding, [ ]  denotes negative finding Cardiac  Comments:  Chest pain or chest pressure:    Shortness  of breath upon exertion:    Short of breath when lying flat:    Irregular heart  rhythm:        Vascular    Pain in calf, thigh, or hip brought on by ambulation:    Pain in feet at night that wakes you up from your sleep:     Blood clot in your veins:    Leg swelling:  x See HPI      Pulmonary    Oxygen at home:    Productive cough:     Wheezing:         Neurologic    Sudden weakness in arms or legs:     Sudden numbness in arms or legs:     Sudden onset of difficulty speaking or slurred speech:    Temporary loss of vision in one eye:     Problems with dizziness:         Gastrointestinal    Blood in stool:     Vomited blood:         Genitourinary    Burning when urinating:     Blood in urine:        Psychiatric    Major depression:         Hematologic    Bleeding problems:    Problems with blood clotting too easily:        Skin    Rashes or ulcers:        Constitutional    Fever or chills:      PHYSICAL EXAMINATION:  Today's Vitals   10/25/23 1340  BP: (!) 155/71  Pulse: 87  Temp: 97.9 F (36.6 C)  SpO2: 94%  Weight: 218 lb (98.9 kg)  Height: 5' 7 (1.702 m)   Body mass index is 34.14 kg/m.   General:  WDWN in NAD; vital signs documented above Gait: Not observed HENT: WNL, normocephalic Pulmonary: normal non-labored breathing without wheezing Cardiac: regular HR; without carotid bruits Abdomen: soft, NT, aortic pulse is not palpable Skin: without rashes Vascular Exam/Pulses:  Right Left  Radial 2+ (normal) 2+ (normal)  DP 2+ (normal) 2+ (normal)   Extremities: mild edema left ankle; no ulcerations present.  Neurologic: A&O X 3;  moving all extremities equally Psychiatric:  The pt has Normal affect.   Non-Invasive Vascular Imaging:   Venous duplex on 10/25/2023: +--------------+---------+------+-----------+------------+--------+  LEFT         Reflux NoRefluxReflux TimeDiameter cmsComments                          Yes                                    +--------------+---------+------+-----------+------------+--------+  CFV          no                                              +--------------+---------+------+-----------+------------+--------+  FV mid        no                                              +--------------+---------+------+-----------+------------+--------+  Popliteal  no                                              +--------------+---------+------+-----------+------------+--------+  GSV at [facility]    no                            0.63              +--------------+---------+------+-----------+------------+--------+  GSV prox thighno                            0.30              +--------------+---------+------+-----------+------------+--------+  GSV mid thigh no                            0.29              +--------------+---------+------+-----------+------------+--------+  GSV dist thighno                            0.26              +--------------+---------+------+-----------+------------+--------+  GSV at knee   no                            0.34              +--------------+---------+------+-----------+------------+--------+  GSV prox calf                               0.38              +--------------+---------+------+-----------+------------+--------+  SSV at [facility]    no                            0.34              +--------------+---------+------+-----------+------------+--------+  SSV prox calf no                            0.22              +--------------+---------+------+-----------+------------+--------+   Summary:  Left:  - No evidence of deep vein thrombosis seen in the left lower extremity, from the common femoral through the popliteal veins.  - No evidence of superficial venous thrombosis in the left lower extremity.  - The deep venous system is competent.  - The great saphenous vein is competent.  - The small saphenous vein is  competent.     Gabriela [name] is a 63 y.o. female who presents with: cramping in her left leg and toes at night and sent for evaluation.     -pt has easily palpable DP pedal pulses -in the left lower extremity, the pt does not have evidence of DVT.  Pt does not have venous reflux in the deep or superficial venous system.   -discussed with pt about wearing knee high 15-20 mmHg compression stockings and she does have these at home.  Put them on in the morning and take off at night.  -discussed the importance of leg  elevation and how to elevate properly - pt is advised to elevate their legs and a diagram is given to them to demonstrate for pt to lay flat on their back with knees elevated and slightly bent with their feet higher than their knees, which puts their feet higher than their heart for 15 minutes per day.  -she does sit for long periods of time at work.  Discussed with her to do as much walking as possible and avoid sitting or standing for long periods of time and get up often to walk around during the day. -discussed importance of weight loss and exercise and that water  aerobics would also be beneficial.  -handout with recommendations given -discussed trying mustard at night for her cramps.   -discussed importance of smoking cessation as it increases risk of PAD, MI, stroke, cancers, pulmonary issues.  -pt will f/u as needed.  We will be glad to see her back if she has further concerns or issues.    Lucie Apt, [facility] [phone]  Clinic MD:  Pearline on call MD

## 2023-10-30 DIAGNOSIS — L819 Disorder of pigmentation, unspecified: Secondary | ICD-10-CM | POA: Diagnosis not present

## 2023-12-04 DIAGNOSIS — M1711 Unilateral primary osteoarthritis, right knee: Secondary | ICD-10-CM | POA: Diagnosis not present

## 2023-12-13 ENCOUNTER — Ambulatory Visit: Admitting: Nurse Practitioner

## 2023-12-13 ENCOUNTER — Encounter: Payer: Self-pay | Admitting: Nurse Practitioner

## 2023-12-13 VITALS — BP 120/82 | HR 74 | Ht 66.75 in | Wt 216.0 lb

## 2023-12-13 DIAGNOSIS — Z01419 Encounter for gynecological examination (general) (routine) without abnormal findings: Secondary | ICD-10-CM

## 2023-12-13 DIAGNOSIS — N951 Menopausal and female climacteric states: Secondary | ICD-10-CM | POA: Diagnosis not present

## 2023-12-13 DIAGNOSIS — Z1331 Encounter for screening for depression: Secondary | ICD-10-CM

## 2023-12-13 DIAGNOSIS — Z6834 Body mass index (BMI) 34.0-34.9, adult: Secondary | ICD-10-CM | POA: Diagnosis not present

## 2023-12-13 DIAGNOSIS — N952 Postmenopausal atrophic vaginitis: Secondary | ICD-10-CM

## 2023-12-13 DIAGNOSIS — Z780 Asymptomatic menopausal state: Secondary | ICD-10-CM

## 2023-12-13 DIAGNOSIS — Z7689 Persons encountering health services in other specified circumstances: Secondary | ICD-10-CM

## 2023-12-13 MED ORDER — VENLAFAXINE HCL ER 37.5 MG PO CP24: 37.5000 mg | ORAL_CAPSULE | Freq: Every day | ORAL | 1 refills | Status: DC

## 2023-12-13 MED ORDER — ESTRADIOL 0.01 % VA CREA
1.0000 g | TOPICAL_CREAM | VAGINAL | 2 refills | Status: AC
Start: 2023-12-16 — End: ?

## 2023-12-13 NOTE — Progress Notes (Signed)
 Gabriela [name] 09-05-1960 [id number]   History:  63 y.o. [id number] presents for annual exam.  Postmenopausal - no HRT. Complains of hot flashes and mood changes. Veozah  not covered. Prescribed Effexor  last year but never started. Would like to start now. Using vaginal estrogen for atrophy. S/P 2003 TVH for fibroids. 1993 cryosurgery, subsequent paps normal. HTN, HLD managed by PCP. Struggling with losing weight. Has increased exercise and been more conscientious of diet. Down 4 pounds since visit last year. Interested in GLP-1.   Gynecologic History No LMP recorded. Patient has had a hysterectomy.   Contraception/Family planning: status post hysterectomy Sexually active: No  Health Maintenance Last Pap: 10/20/2019. Results were: Normal Last mammogram: 05/14/2023. Results were: Normal Last colonoscopy: 2023. Results were: Polyps, 5-year recall Last Dexa: 10/21/2018. Results were: Normal     12/13/2023    3:05 PM  Depression screen PHQ 2/9  Decreased Interest 0  Down, Depressed, Hopeless 0  PHQ - 2 Score 0     Past medical history, past surgical history, family history and social history were all reviewed and documented in the EPIC chart. Works at [employer]. Son and oldest grandson live locally. 63 yo grandson in [place].  ROS:  A ROS was performed and pertinent positives and negatives are included.  Exam:  Vitals:   12/13/23 1459  BP: 120/82  Pulse: 74  SpO2: 98%  Weight: 216 lb (98 kg)  Height: 5' 6.75 (1.695 m)       Body mass index is 34.08 kg/m.  General appearance:  Normal Thyroid :  Symmetrical, normal in size, without palpable masses or nodularity. Respiratory  Auscultation:  Clear without wheezing or rhonchi Cardiovascular  Auscultation:  Regular rate, without rubs, murmurs or gallops  Edema/varicosities:  Not grossly evident Abdominal  Soft,nontender, without masses, guarding or rebound.  Liver/spleen:  No organomegaly noted  Hernia:  None  appreciated  Skin  Inspection:  Grossly normal   Breasts: Examined lying and sitting.   Right: Without masses, retractions, discharge or axillary adenopathy.   Left: Without masses, retractions, discharge or axillary adenopathy. Pelvic: External genitalia:  no lesions              Urethra:  normal appearing urethra with no masses, tenderness or lesions              Bartholins and Skenes: normal                 Vagina: normal appearing vagina with normal color and discharge, no lesions. Atrophic changes              Cervix: absent Bimanual Exam:  Uterus: absent              Adnexa: no mass, fullness, tenderness              Rectovaginal: Deferred              Anus:  normal, no lesions  Gabriela [name], CMA present as chaperone.   Assessment/Plan:  63 y.o. [id number] for annual exam.   Well female exam with routine gynecological exam - Education provided on SBEs, importance of preventative screenings, current guidelines, high calcium  diet, regular exercise, and multivitamin daily.  Labs with PCP.   Postmenopausal - no HRT. S/P 2003 TVH for fibroids.   Vasomotor symptoms due to menopause - Plan: venlafaxine  XR (EFFEXOR  XR) 37.5 MG 24 hr capsule daily. Complains of hot flashes and mood changes. Veozah  not covered. Prescribed Effexor  last year but  never started. Would like to start now.  Vaginal atrophy - Plan: estradiol  (ESTRACE ) 0.01 % CREA vaginal cream twice weekly.   BMI 34.0-34.9,adult  Encounter for weight management - discussed importance of daily calorie deficit for weight loss, intermittent fasting, tracking macros, using apps for tracking. Interested in GLP-1. She will contact insurance. Recommend seeing nutritionist.   Screening for cervical cancer - Normal Pap history.  No longer screening per guidelines.   Screening for breast cancer - UTD. Continue annual screenings. Normal breast exam today.  Screening for colon cancer - 2023 colonoscopy. Will repeat at GI's recommended  interval.   Screening for osteoporosis - Normal bone density 2020. Repeat at age 63.   Return in about 1 year (around 12/12/2024) for Annual.    Gabriela [name], 3:49 PM 12/13/2023

## 2023-12-30 DIAGNOSIS — M25561 Pain in right knee: Secondary | ICD-10-CM | POA: Diagnosis not present

## 2023-12-30 DIAGNOSIS — M1711 Unilateral primary osteoarthritis, right knee: Secondary | ICD-10-CM | POA: Diagnosis not present

## 2024-01-09 DIAGNOSIS — M25561 Pain in right knee: Secondary | ICD-10-CM | POA: Diagnosis not present

## 2024-01-09 DIAGNOSIS — M1711 Unilateral primary osteoarthritis, right knee: Secondary | ICD-10-CM | POA: Diagnosis not present

## 2024-02-24 ENCOUNTER — Other Ambulatory Visit: Payer: Self-pay | Admitting: Nurse Practitioner

## 2024-02-24 DIAGNOSIS — N951 Menopausal and female climacteric states: Secondary | ICD-10-CM

## 2024-02-25 NOTE — Telephone Encounter (Signed)
 Med refill request: venlafaxine  XR (EFFEXOR  XR) 37.5 MG 24 hr capsule  Start:  12/13/23 Disp:  30 capsules Refills:  1  Last AEX:  12/13/23 Next AEX:  Not yet scheduled Last MMG (if hormonal med):  N/A Refill authorized? Please Advise.
